# Patient Record
Sex: Female | Born: 1965 | Race: White | Hispanic: No | Marital: Married | Smoking: Never smoker
Health system: Southern US, Community
[De-identification: ages and names within clinical notes are randomized; demographics above are authoritative.]

## PROBLEM LIST (undated history)

## (undated) DIAGNOSIS — A4902 Methicillin resistant Staphylococcus aureus infection, unspecified site: Secondary | ICD-10-CM

## (undated) DIAGNOSIS — K219 Gastro-esophageal reflux disease without esophagitis: Secondary | ICD-10-CM

## (undated) DIAGNOSIS — C541 Malignant neoplasm of endometrium: Secondary | ICD-10-CM

## (undated) DIAGNOSIS — M170 Bilateral primary osteoarthritis of knee: Secondary | ICD-10-CM

## (undated) DIAGNOSIS — H8110 Benign paroxysmal vertigo, unspecified ear: Secondary | ICD-10-CM

## (undated) DIAGNOSIS — N61 Mastitis without abscess: Secondary | ICD-10-CM

## (undated) DIAGNOSIS — R197 Diarrhea, unspecified: Secondary | ICD-10-CM

## (undated) DIAGNOSIS — E559 Vitamin D deficiency, unspecified: Secondary | ICD-10-CM

## (undated) DIAGNOSIS — G4700 Insomnia, unspecified: Secondary | ICD-10-CM

## (undated) DIAGNOSIS — I10 Essential (primary) hypertension: Secondary | ICD-10-CM

## (undated) DIAGNOSIS — S82201A Unspecified fracture of shaft of right tibia, initial encounter for closed fracture: Secondary | ICD-10-CM

## (undated) DIAGNOSIS — K760 Fatty (change of) liver, not elsewhere classified: Secondary | ICD-10-CM

## (undated) DIAGNOSIS — E119 Type 2 diabetes mellitus without complications: Secondary | ICD-10-CM

## (undated) DIAGNOSIS — M549 Dorsalgia, unspecified: Secondary | ICD-10-CM

## (undated) DIAGNOSIS — R140 Abdominal distension (gaseous): Secondary | ICD-10-CM

## (undated) DIAGNOSIS — I714 Abdominal aortic aneurysm, without rupture: Secondary | ICD-10-CM

## (undated) DIAGNOSIS — F411 Generalized anxiety disorder: Secondary | ICD-10-CM

## (undated) DIAGNOSIS — R7303 Prediabetes: Secondary | ICD-10-CM

## (undated) DIAGNOSIS — R1012 Left upper quadrant pain: Secondary | ICD-10-CM

## (undated) DIAGNOSIS — M62838 Other muscle spasm: Secondary | ICD-10-CM

## (undated) DIAGNOSIS — N611 Abscess of the breast and nipple: Secondary | ICD-10-CM

## (undated) DIAGNOSIS — R748 Abnormal levels of other serum enzymes: Secondary | ICD-10-CM

## (undated) DIAGNOSIS — R29898 Other symptoms and signs involving the musculoskeletal system: Secondary | ICD-10-CM

## (undated) DIAGNOSIS — E785 Hyperlipidemia, unspecified: Secondary | ICD-10-CM

## (undated) DIAGNOSIS — M5030 Other cervical disc degeneration, unspecified cervical region: Secondary | ICD-10-CM

## (undated) DIAGNOSIS — C801 Malignant (primary) neoplasm, unspecified: Secondary | ICD-10-CM

## (undated) DIAGNOSIS — R1011 Right upper quadrant pain: Secondary | ICD-10-CM

## (undated) HISTORY — DX: Hyperlipidemia, unspecified: E78.5

## (undated) HISTORY — DX: Other cervical disc degeneration, unspecified cervical region: M50.30

## (undated) HISTORY — DX: Abnormal levels of other serum enzymes: R74.8

## (undated) HISTORY — PX: ABDOMINAL HYSTERECTOMY: SHX81

## (undated) HISTORY — DX: Fatty (change of) liver, not elsewhere classified: K76.0

## (undated) HISTORY — DX: Right upper quadrant pain: R10.11

## (undated) HISTORY — DX: Type 2 diabetes mellitus without complications: E11.9

## (undated) HISTORY — DX: Malignant (primary) neoplasm, unspecified: C80.1

## (undated) HISTORY — DX: Other muscle spasm: M62.838

## (undated) HISTORY — DX: Left upper quadrant pain: R10.12

## (undated) HISTORY — DX: Dorsalgia, unspecified: M54.9

## (undated) HISTORY — PX: ORTHOPEDIC SURGERY: SHX850

## (undated) HISTORY — DX: Unspecified fracture of shaft of right tibia, initial encounter for closed fracture: S82.201A

## (undated) HISTORY — DX: Diarrhea, unspecified: R19.7

## (undated) HISTORY — DX: Mastitis without abscess: N61.0

## (undated) HISTORY — DX: Abdominal aortic aneurysm, without rupture: I71.4

## (undated) HISTORY — DX: Abdominal distension (gaseous): R14.0

## (undated) HISTORY — DX: Abscess of the breast and nipple: N61.1

## (undated) HISTORY — DX: Malignant neoplasm of endometrium: C54.1

## (undated) HISTORY — DX: Generalized anxiety disorder: F41.1

## (undated) HISTORY — DX: Insomnia, unspecified: G47.00

## (undated) HISTORY — DX: Essential (primary) hypertension: I10

## (undated) HISTORY — DX: Vitamin D deficiency, unspecified: E55.9

## (undated) HISTORY — DX: Methicillin resistant Staphylococcus aureus infection, unspecified site: A49.02

## (undated) HISTORY — DX: Other symptoms and signs involving the musculoskeletal system: R29.898

## (undated) HISTORY — DX: Morbid (severe) obesity due to excess calories: E66.01

## (undated) HISTORY — DX: Bilateral primary osteoarthritis of knee: M17.0

## (undated) HISTORY — DX: Gastro-esophageal reflux disease without esophagitis: K21.9

## (undated) HISTORY — DX: Benign paroxysmal vertigo, unspecified ear: H81.10

## (undated) HISTORY — DX: Prediabetes: R73.03

---

## 2014-05-16 ENCOUNTER — Ambulatory Visit (INDEPENDENT_AMBULATORY_CARE_PROVIDER_SITE_OTHER): Payer: BC Managed Care – PPO | Admitting: Physician Assistant

## 2014-05-16 ENCOUNTER — Encounter: Payer: Self-pay | Admitting: Physician Assistant

## 2014-05-16 ENCOUNTER — Other Ambulatory Visit: Payer: Self-pay | Admitting: Physician Assistant

## 2014-05-16 VITALS — BP 133/59 | HR 98 | Ht 60.0 in | Wt 313.0 lb

## 2014-05-16 DIAGNOSIS — S82201S Unspecified fracture of shaft of right tibia, sequela: Secondary | ICD-10-CM

## 2014-05-16 DIAGNOSIS — G4700 Insomnia, unspecified: Secondary | ICD-10-CM

## 2014-05-16 DIAGNOSIS — C541 Malignant neoplasm of endometrium: Secondary | ICD-10-CM

## 2014-05-16 DIAGNOSIS — R5381 Other malaise: Secondary | ICD-10-CM | POA: Diagnosis not present

## 2014-05-16 DIAGNOSIS — F411 Generalized anxiety disorder: Secondary | ICD-10-CM | POA: Diagnosis not present

## 2014-05-16 DIAGNOSIS — K219 Gastro-esophageal reflux disease without esophagitis: Secondary | ICD-10-CM

## 2014-05-16 DIAGNOSIS — R5383 Other fatigue: Secondary | ICD-10-CM

## 2014-05-16 DIAGNOSIS — Z23 Encounter for immunization: Secondary | ICD-10-CM

## 2014-05-16 DIAGNOSIS — Z13220 Encounter for screening for lipoid disorders: Secondary | ICD-10-CM

## 2014-05-16 DIAGNOSIS — Z131 Encounter for screening for diabetes mellitus: Secondary | ICD-10-CM

## 2014-05-16 DIAGNOSIS — I10 Essential (primary) hypertension: Secondary | ICD-10-CM

## 2014-05-16 DIAGNOSIS — Z1239 Encounter for other screening for malignant neoplasm of breast: Secondary | ICD-10-CM

## 2014-05-16 HISTORY — DX: Malignant neoplasm of endometrium: C54.1

## 2014-05-16 MED ORDER — PANTOPRAZOLE SODIUM 40 MG PO TBEC: 40.0000 mg | DELAYED_RELEASE_TABLET | Freq: Every day | ORAL | Status: DC

## 2014-05-16 MED ORDER — OLMESARTAN MEDOXOMIL-HCTZ 20-12.5 MG PO TABS: 1.0000 | ORAL_TABLET | Freq: Every day | ORAL | Status: DC

## 2014-05-16 MED ORDER — ALPRAZOLAM 0.5 MG PO TABS: 0.5000 mg | ORAL_TABLET | Freq: Every day | ORAL | Status: DC

## 2014-05-17 DIAGNOSIS — S82201A Unspecified fracture of shaft of right tibia, initial encounter for closed fracture: Secondary | ICD-10-CM | POA: Insufficient documentation

## 2014-05-17 DIAGNOSIS — F411 Generalized anxiety disorder: Secondary | ICD-10-CM

## 2014-05-17 DIAGNOSIS — G4700 Insomnia, unspecified: Secondary | ICD-10-CM | POA: Insufficient documentation

## 2014-05-17 DIAGNOSIS — F419 Anxiety disorder, unspecified: Secondary | ICD-10-CM | POA: Insufficient documentation

## 2014-05-17 DIAGNOSIS — K219 Gastro-esophageal reflux disease without esophagitis: Secondary | ICD-10-CM | POA: Insufficient documentation

## 2014-05-17 DIAGNOSIS — I10 Essential (primary) hypertension: Secondary | ICD-10-CM | POA: Insufficient documentation

## 2014-05-17 HISTORY — DX: Unspecified fracture of shaft of right tibia, initial encounter for closed fracture: S82.201A

## 2014-05-17 HISTORY — DX: Essential (primary) hypertension: I10

## 2014-05-17 HISTORY — DX: Insomnia, unspecified: G47.00

## 2014-05-17 HISTORY — DX: Generalized anxiety disorder: F41.1

## 2014-05-17 HISTORY — DX: Gastro-esophageal reflux disease without esophagitis: K21.9

## 2014-05-17 NOTE — Progress Notes (Signed)
   Subjective:    Patient ID: Tamara Greer, female    DOB: 1966-04-07, 48 y.o.   MRN: [id number]  HPI Pt is a 48 yo female who presents to the clinic to establish care.   .. Active Ambulatory Problems    Diagnosis Date Noted  . Endometrial cancer 05/16/2014  . HTN (hypertension), benign 05/17/2014  . Insomnia 05/17/2014  . Anxiety state, unspecified 05/17/2014   Resolved Ambulatory Problems    Diagnosis Date Noted  . No Resolved Ambulatory Problems   Past Medical History  Diagnosis Date  . Cancer    .[name] Kitchen Family History  Problem Relation Age of Onset  . Diabetes Mother    .[name] Kitchen History   Social History  . Marital Status: Married    Spouse Name: N/A    Number of Children: N/A  . Years of Education: N/A   Occupational History  . Not on file.   Social History Main Topics  . Smoking status: Never Smoker   . Smokeless tobacco: Not on file  . Alcohol Use: No  . Drug Use: No  . Sexual Activity: Yes   Other Topics Concern  . Not on file   Social History Narrative  . No narrative on file   Pt does need refills.   HTN- controlled. No problems or complications. Denies CP, palpations, vision changes, or headaches.   GERD- controlled with protonix.   Insomnia/anxiety- controlled with xanax at bedtime.    Review of Systems  All other systems reviewed and are negative.      Objective:   Physical Exam  Constitutional: She is oriented to person, place, and time. She appears well-developed and well-nourished.  obese  HENT:  Head: Normocephalic and atraumatic.  Cardiovascular: Normal rate, regular rhythm and normal heart sounds.   Pulmonary/Chest: Effort normal and breath sounds normal. She has no wheezes.  Neurological: She is alert and oriented to person, place, and time.  Skin: Skin is dry.  Psychiatric: She has a normal mood and affect. Her behavior is normal.          Assessment & Plan:  HTN- refilled benicar for 6 months.   GERD- refilled protonix  for 6 months.   Insomnia/anxiety- xanax refilled for 6 months.   Fatigue/screening labs- will get full work up done via blood work. Schedule CPE and will discuss blood work at that time.   Tdap given without complications today.   Pt declined flu shot.   Mammogram ordered today.   Discussed with pt need for CPE. Sent order for fasting labs.

## 2014-05-18 LAB — CBC
HEMATOCRIT: 38.4 % (ref 36.0–46.0)
HEMOGLOBIN: 13.3 g/dL (ref 12.0–15.0)
MCH: 31.6 pg (ref 26.0–34.0)
MCHC: 34.6 g/dL (ref 30.0–36.0)
MCV: 91.2 fL (ref 78.0–100.0)
Platelets: 226 10*3/uL (ref 150–400)
RBC: 4.21 MIL/uL (ref 3.87–5.11)
RDW: 13.6 % (ref 11.5–15.5)
WBC: 5.4 10*3/uL (ref 4.0–10.5)

## 2014-05-19 ENCOUNTER — Encounter: Payer: Self-pay | Admitting: Physician Assistant

## 2014-05-19 ENCOUNTER — Other Ambulatory Visit: Payer: Self-pay | Admitting: Physician Assistant

## 2014-05-19 DIAGNOSIS — R748 Abnormal levels of other serum enzymes: Secondary | ICD-10-CM

## 2014-05-19 DIAGNOSIS — E559 Vitamin D deficiency, unspecified: Secondary | ICD-10-CM | POA: Insufficient documentation

## 2014-05-19 HISTORY — DX: Vitamin D deficiency, unspecified: E55.9

## 2014-05-19 HISTORY — DX: Abnormal levels of other serum enzymes: R74.8

## 2014-05-19 LAB — COMPLETE METABOLIC PANEL WITH GFR
ALT: 115 U/L — ABNORMAL HIGH (ref 0–35)
AST: 117 U/L — AB (ref 0–37)
Albumin: 4.4 g/dL (ref 3.5–5.2)
Alkaline Phosphatase: 84 U/L (ref 39–117)
BUN: 17 mg/dL (ref 6–23)
CHLORIDE: 100 meq/L (ref 96–112)
CO2: 23 meq/L (ref 19–32)
CREATININE: 0.56 mg/dL (ref 0.50–1.10)
Calcium: 9.5 mg/dL (ref 8.4–10.5)
GFR, Est Non African American: >89 mL/min
Glucose, Bld: 122 mg/dL — ABNORMAL HIGH (ref 70–99)
Potassium: 4.5 mEq/L (ref 3.5–5.3)
Sodium: 136 mEq/L (ref 135–145)
Total Bilirubin: 0.7 mg/dL (ref 0.2–1.2)
Total Protein: 7.1 g/dL (ref 6.0–8.3)

## 2014-05-19 LAB — LIPID PANEL
CHOLESTEROL: 239 mg/dL — AB (ref 0–200)
HDL: 56 mg/dL (ref >39)
LDL Cholesterol: 152 mg/dL — ABNORMAL HIGH (ref 0–99)
TRIGLYCERIDES: 153 mg/dL — AB (ref <150)
Total CHOL/HDL Ratio: 4.3 Ratio
VLDL: 31 mg/dL (ref 0–40)

## 2014-05-19 LAB — HEMOGLOBIN A1C
HEMOGLOBIN A1C: 6.4 % — AB (ref <5.7)
MEAN PLASMA GLUCOSE: 137 mg/dL — AB (ref <117)

## 2014-05-19 LAB — VITAMIN B12: Vitamin B-12: 418 pg/mL (ref 211–911)

## 2014-05-19 LAB — TSH: TSH: 3.513 u[IU]/mL (ref 0.350–4.500)

## 2014-05-19 LAB — FERRITIN: Ferritin: 214 ng/mL (ref 10–291)

## 2014-05-19 LAB — VITAMIN D 25 HYDROXY (VIT D DEFICIENCY, FRACTURES): Vit D, 25-Hydroxy: 14 ng/mL — ABNORMAL LOW (ref 30–89)

## 2014-05-19 MED ORDER — VITAMIN D (ERGOCALCIFEROL) 1.25 MG (50000 UNIT) PO CAPS: 50000.0000 [IU] | ORAL_CAPSULE | ORAL | Status: DC

## 2014-05-20 ENCOUNTER — Other Ambulatory Visit: Payer: Self-pay | Admitting: *Deleted

## 2014-05-20 MED ORDER — OLMESARTAN MEDOXOMIL-HCTZ 40-12.5 MG PO TABS: 1.0000 | ORAL_TABLET | Freq: Every day | ORAL | Status: DC

## 2014-05-31 ENCOUNTER — Encounter: Payer: Self-pay | Admitting: Physician Assistant

## 2014-05-31 DIAGNOSIS — M170 Bilateral primary osteoarthritis of knee: Secondary | ICD-10-CM

## 2014-05-31 HISTORY — DX: Bilateral primary osteoarthritis of knee: M17.0

## 2014-06-08 ENCOUNTER — Ambulatory Visit (INDEPENDENT_AMBULATORY_CARE_PROVIDER_SITE_OTHER): Payer: BC Managed Care – PPO | Admitting: Physician Assistant

## 2014-06-08 ENCOUNTER — Encounter: Payer: Self-pay | Admitting: Physician Assistant

## 2014-06-08 ENCOUNTER — Ambulatory Visit (INDEPENDENT_AMBULATORY_CARE_PROVIDER_SITE_OTHER): Payer: BC Managed Care – PPO

## 2014-06-08 VITALS — BP 110/61 | HR 91 | Ht 60.0 in | Wt 311.0 lb

## 2014-06-08 DIAGNOSIS — Z1239 Encounter for other screening for malignant neoplasm of breast: Secondary | ICD-10-CM

## 2014-06-08 DIAGNOSIS — E785 Hyperlipidemia, unspecified: Secondary | ICD-10-CM

## 2014-06-08 DIAGNOSIS — Z0000 Encounter for general adult medical examination without abnormal findings: Secondary | ICD-10-CM | POA: Diagnosis not present

## 2014-06-08 DIAGNOSIS — Z1231 Encounter for screening mammogram for malignant neoplasm of breast: Secondary | ICD-10-CM

## 2014-06-08 DIAGNOSIS — E559 Vitamin D deficiency, unspecified: Secondary | ICD-10-CM

## 2014-06-08 DIAGNOSIS — R748 Abnormal levels of other serum enzymes: Secondary | ICD-10-CM | POA: Diagnosis not present

## 2014-06-08 DIAGNOSIS — R7303 Prediabetes: Secondary | ICD-10-CM

## 2014-06-08 DIAGNOSIS — E669 Obesity, unspecified: Secondary | ICD-10-CM

## 2014-06-08 DIAGNOSIS — R7309 Other abnormal glucose: Secondary | ICD-10-CM

## 2014-06-08 DIAGNOSIS — R635 Abnormal weight gain: Secondary | ICD-10-CM

## 2014-06-08 MED ORDER — PHENTERMINE HCL 37.5 MG PO TABS: 37.5000 mg | ORAL_TABLET | Freq: Every day | ORAL | Status: DC

## 2014-06-08 MED ORDER — ATORVASTATIN CALCIUM 40 MG PO TABS: 40.0000 mg | ORAL_TABLET | Freq: Every day | ORAL | Status: DC

## 2014-06-08 MED ORDER — ESOMEPRAZOLE MAGNESIUM 40 MG PO PACK: 40.0000 mg | PACK | Freq: Every day | ORAL | Status: DC

## 2014-06-08 MED ORDER — OLMESARTAN MEDOXOMIL-HCTZ 20-12.5 MG PO TABS: 1.0000 | ORAL_TABLET | Freq: Every day | ORAL | Status: DC

## 2014-06-08 NOTE — Patient Instructions (Signed)

## 2014-06-13 NOTE — Progress Notes (Signed)
  Subjective:     Tamara Greer is a 48 y.o. female and is here for a comprehensive physical exam. The patient reports problems - concerned about weight and labs that were drawn at last visit. [name] Kitchen  History   Social History  . Marital Status: Married    Spouse Name: N/A    Number of Children: N/A  . Years of Education: N/A   Occupational History  . Not on file.   Social History Main Topics  . Smoking status: Never Smoker   . Smokeless tobacco: Not on file  . Alcohol Use: No  . Drug Use: No  . Sexual Activity: Yes   Other Topics Concern  . Not on file   Social History Narrative  . No narrative on file   Health Maintenance  Topic Date Due  . Pap Smear  02/17/1982  . Influenza Vaccine  05/17/2015 (Originally 04/02/2014)  . Mammogram  06/08/2016  . Colonoscopy  09/03/2019  . Tetanus/tdap  05/16/2024    The following portions of the patient's history were reviewed and updated as appropriate: allergies, current medications, past family history, past medical history, past social history, past surgical history and problem list.  Review of Systems A comprehensive review of systems was negative.   Objective:    BP 110/61  Pulse 91  Ht 5' (1.524 m)  Wt 311 lb (141.069 kg)  BMI 60.74 kg/m2 General appearance: alert, cooperative, appears stated age and moderately obese Head: Normocephalic, without obvious abnormality, atraumatic Eyes: conjunctivae/corneas clear. PERRL, EOM's intact. Fundi benign. Ears: normal TM's and external ear canals both ears Nose: Nares normal. Septum midline. Mucosa normal. No drainage or sinus tenderness. Throat: lips, mucosa, and tongue normal; teeth and gums normal Neck: no adenopathy, no carotid bruit, no JVD, supple, symmetrical, trachea midline and thyroid not enlarged, symmetric, no tenderness/mass/nodules Back: symmetric, no curvature. ROM normal. No CVA tenderness. Lungs: clear to auscultation bilaterally Heart: regular rate and rhythm, S1, S2  normal, no murmur, click, rub or gallop Abdomen: soft, non-tender; bowel sounds normal; no masses,  no organomegaly Extremities: extremities normal, atraumatic, no cyanosis or edema Pulses: 2+ and symmetric Skin: Skin color, texture, turgor normal. No rashes or lesions Lymph nodes: Cervical, supraclavicular, and axillary nodes normal. Neurologic: Grossly normal    Assessment:    Healthy female exam.       Plan:    CPE- flu shot declined.discussed need for pap. colonoscopy up to date. Mammogram up to date.  Discussed weight loss and regular exercise. Recommended calcium 1200mg . Pt is on high dose vitamin D for vitamin insufficiency.   Obesity/abnormal weight loss- discuss plan. Calorie 1500 daily. Phentermine. Discussed side effects. Drink more water. Follow up in 4 weeks.   Vitamin D deficiency  will recheck after being on vitamin D doses.   Elevated liver enzymes- will recheck after elevation on last labs.   Hyperlipidemia- under 160 but with pre-diabetes dx concerning and would like for patient to be on. Unfortunately they can elevated liver enzymes. Given rx but would like patient to hold off until recheck liver enzymes and decided next best step.  See After Visit Summary for Counseling Recommendations

## 2014-07-26 ENCOUNTER — Ambulatory Visit (INDEPENDENT_AMBULATORY_CARE_PROVIDER_SITE_OTHER): Payer: BC Managed Care – PPO | Admitting: Physician Assistant

## 2014-07-26 ENCOUNTER — Encounter: Payer: Self-pay | Admitting: Physician Assistant

## 2014-07-26 VITALS — BP 127/64 | HR 85 | Temp 98.3°F | Ht 60.0 in

## 2014-07-26 DIAGNOSIS — R062 Wheezing: Secondary | ICD-10-CM | POA: Diagnosis not present

## 2014-07-26 DIAGNOSIS — J4521 Mild intermittent asthma with (acute) exacerbation: Secondary | ICD-10-CM

## 2014-07-26 DIAGNOSIS — R05 Cough: Secondary | ICD-10-CM

## 2014-07-26 DIAGNOSIS — R059 Cough, unspecified: Secondary | ICD-10-CM

## 2014-07-26 MED ORDER — AZITHROMYCIN 250 MG PO TABS: ORAL_TABLET | ORAL | Status: DC

## 2014-07-26 MED ORDER — IPRATROPIUM-ALBUTEROL 0.5-2.5 (3) MG/3ML IN SOLN
3.0000 mL | Freq: Once | RESPIRATORY_TRACT | Status: AC
Start: 2014-07-26 — End: 2014-07-26
  Administered 2014-07-26: 3 mL via RESPIRATORY_TRACT

## 2014-07-26 MED ORDER — PREDNISONE 50 MG PO TABS: ORAL_TABLET | ORAL | Status: DC

## 2014-07-26 MED ORDER — ALBUTEROL SULFATE 108 (90 BASE) MCG/ACT IN AEPB: 2.0000 | INHALATION_SPRAY | RESPIRATORY_TRACT | Status: DC | PRN

## 2014-07-26 NOTE — Progress Notes (Signed)
   Subjective:    Patient ID: Tamara Greer, female    DOB: May 07, 1966, 48 y.o.   MRN: [id number]  HPI Pt is a 48 yo female who presents to the clinic with cough, wheezing, SOB for last 5 days. Cough is productive with green sputum.  Hx of childhood asthma. Denies any fever, chills, n/v/d. Has had URI earlier last week but now feels like going into chest. She has been a lot more fatigued lately. Not done anything to make better.    Review of Systems  All other systems reviewed and are negative.      Objective:   Physical Exam  Constitutional: She is oriented to person, place, and time. She appears well-developed and well-nourished.  HENT:  Head: Normocephalic and atraumatic.  Right Ear: External ear normal.  Left Ear: External ear normal.  Nose: Nose normal.  Mouth/Throat: Oropharynx is clear and moist. No oropharyngeal exudate.  Eyes: Conjunctivae are normal. Right eye exhibits no discharge. Left eye exhibits no discharge.  Neck: Normal range of motion. Neck supple.  Cardiovascular: Normal rate, regular rhythm and normal heart sounds.   Pulmonary/Chest:  Effort good.  Expiratory wheezing heard on exam today bilateral lungs.  No rhonchi or crackles.   Lymphadenopathy:    She has no cervical adenopathy.  Neurological: She is alert and oriented to person, place, and time.  Skin: Skin is dry.  Psychiatric: She has a normal mood and affect. Her behavior is normal.          Assessment & Plan:  Asthmatic bronchitis- peak flows yellow. duoneb given with improvement. Sent albuterol inhaler to pharmacy to use every 4-6 hours as needed. Prednisone for 5 days. zpak for 5 days. Follow up if not improving.

## 2014-07-26 NOTE — Patient Instructions (Signed)

## 2014-09-30 ENCOUNTER — Encounter: Payer: Self-pay | Admitting: Physician Assistant

## 2014-09-30 ENCOUNTER — Ambulatory Visit (INDEPENDENT_AMBULATORY_CARE_PROVIDER_SITE_OTHER): Payer: BLUE CROSS/BLUE SHIELD | Admitting: Physician Assistant

## 2014-09-30 VITALS — Ht 60.0 in | Wt 307.0 lb

## 2014-09-30 DIAGNOSIS — I10 Essential (primary) hypertension: Secondary | ICD-10-CM

## 2014-09-30 DIAGNOSIS — R42 Dizziness and giddiness: Secondary | ICD-10-CM

## 2014-09-30 DIAGNOSIS — H8110 Benign paroxysmal vertigo, unspecified ear: Secondary | ICD-10-CM | POA: Insufficient documentation

## 2014-09-30 DIAGNOSIS — H8113 Benign paroxysmal vertigo, bilateral: Secondary | ICD-10-CM | POA: Diagnosis not present

## 2014-09-30 HISTORY — DX: Benign paroxysmal vertigo, unspecified ear: H81.10

## 2014-09-30 MED ORDER — PREDNISONE 50 MG PO TABS: ORAL_TABLET | ORAL | Status: DC

## 2014-09-30 MED ORDER — OLMESARTAN MEDOXOMIL-HCTZ 40-12.5 MG PO TABS: 1.0000 | ORAL_TABLET | Freq: Every day | ORAL | Status: DC

## 2014-09-30 NOTE — Progress Notes (Signed)
   Subjective:    Patient ID: Tamara Greer, female    DOB: Jul 18, 1966, 49 y.o.   MRN: [id number]  HPI  Pt presents to the clinic with dizziness for last month off and on. She has periods where the whole room spins. No vision changes. Not checking BP but face feels flushed a lot. Turning head fast sometimes makes worse. No new medications. No ear pain. She did have a recent URI/sinusitis where she took abx. Sinusitis symptoms are improved. No CP, palpitations, arm numbness. No nausea, fever, chills. Has not passed out. Not tried anything to make better. We did decrease Benicar at last visit.   For the last month. Sit still and room spin. Dizzy. A little SOB. No vision changes. Anything make worse- sometimes turning head. Laying down and room spinning. No new medications. nexium OTC. No ear pain. Recent URI.    Review of Systems  All other systems reviewed and are negative.      Objective:   Physical Exam  Constitutional: She is oriented to person, place, and time. She appears well-developed and well-nourished.  HENT:  Head: Normocephalic and atraumatic.  Right Ear: External ear normal.  Left Ear: External ear normal.  Nose: Nose normal.  Mouth/Throat: Oropharynx is clear and moist. No oropharyngeal exudate.  Eyes: Conjunctivae are normal. Right eye exhibits no discharge. Left eye exhibits no discharge.  Cardiovascular: Normal rate, regular rhythm and normal heart sounds.   Pulmonary/Chest: Effort normal and breath sounds normal. She has no wheezes.  Neurological: She is alert and oriented to person, place, and time.  dix hallpike bilaterally.  Skin: Skin is dry.  Psychiatric: She has a normal mood and affect. Her behavior is normal.          Assessment & Plan:  BPV/dizziness/HTN- dix hallpike positive for BPV bilaterally. Orthostatic normal. BP elevated today. TM's were bulding with fluid. Given prednisone for 5 days. Start flonase 2 sprays bid. epley manuevers given to start 3x  a day. BP high today and could be causing some of symptoms. Increased back to 40mg /12.5mg . Double up on what you have first. Check BP regularly at pharmacy. Follow up nurse for  visit in 1 month for BP recheck. Will get cmp to look for any metabolic abnormalities. Follow up with new symptoms or worsening.

## 2014-09-30 NOTE — Patient Instructions (Signed)
Prednisone start.  flonase 2 sprays each nostril.  Start epley manuevers.  Double up on Benicar lower dose.  Follow up 4 weeks.

## 2014-10-04 LAB — COMPLETE METABOLIC PANEL WITH GFR
ALBUMIN: 4 g/dL (ref 3.5–5.2)
ALK PHOS: 88 U/L (ref 39–117)
ALT: 140 U/L — ABNORMAL HIGH (ref 0–35)
AST: 96 U/L — AB (ref 0–37)
BUN: 18 mg/dL (ref 6–23)
CO2: 25 mEq/L (ref 19–32)
Calcium: 8.9 mg/dL (ref 8.4–10.5)
Chloride: 100 mEq/L (ref 96–112)
Creat: 0.52 mg/dL (ref 0.50–1.10)
GFR, Est Non African American: >89 mL/min
GLUCOSE: 106 mg/dL — AB (ref 70–99)
POTASSIUM: 4.3 meq/L (ref 3.5–5.3)
SODIUM: 138 meq/L (ref 135–145)
Total Bilirubin: 0.5 mg/dL (ref 0.2–1.2)
Total Protein: 6.6 g/dL (ref 6.0–8.3)

## 2014-10-06 ENCOUNTER — Telehealth: Payer: Self-pay | Admitting: *Deleted

## 2014-10-06 DIAGNOSIS — R748 Abnormal levels of other serum enzymes: Secondary | ICD-10-CM

## 2014-10-10 ENCOUNTER — Other Ambulatory Visit: Payer: BLUE CROSS/BLUE SHIELD

## 2014-10-13 ENCOUNTER — Ambulatory Visit (INDEPENDENT_AMBULATORY_CARE_PROVIDER_SITE_OTHER): Payer: BLUE CROSS/BLUE SHIELD

## 2014-10-13 DIAGNOSIS — R748 Abnormal levels of other serum enzymes: Secondary | ICD-10-CM

## 2014-10-13 DIAGNOSIS — K760 Fatty (change of) liver, not elsewhere classified: Secondary | ICD-10-CM

## 2014-10-14 ENCOUNTER — Other Ambulatory Visit: Payer: Self-pay | Admitting: Physician Assistant

## 2014-10-14 DIAGNOSIS — K760 Fatty (change of) liver, not elsewhere classified: Secondary | ICD-10-CM

## 2014-10-14 DIAGNOSIS — I714 Abdominal aortic aneurysm, without rupture, unspecified: Secondary | ICD-10-CM

## 2014-10-14 HISTORY — DX: Fatty (change of) liver, not elsewhere classified: K76.0

## 2014-10-20 ENCOUNTER — Ambulatory Visit (INDEPENDENT_AMBULATORY_CARE_PROVIDER_SITE_OTHER): Payer: BLUE CROSS/BLUE SHIELD

## 2014-10-20 DIAGNOSIS — I714 Abdominal aortic aneurysm, without rupture, unspecified: Secondary | ICD-10-CM

## 2014-10-21 ENCOUNTER — Encounter: Payer: Self-pay | Admitting: Physician Assistant

## 2014-10-21 DIAGNOSIS — I714 Abdominal aortic aneurysm, without rupture, unspecified: Secondary | ICD-10-CM

## 2014-10-21 HISTORY — DX: Abdominal aortic aneurysm, without rupture: I71.4

## 2014-10-21 HISTORY — DX: Abdominal aortic aneurysm, without rupture, unspecified: I71.40

## 2014-10-25 ENCOUNTER — Ambulatory Visit (INDEPENDENT_AMBULATORY_CARE_PROVIDER_SITE_OTHER): Payer: BLUE CROSS/BLUE SHIELD | Admitting: Physician Assistant

## 2014-10-25 ENCOUNTER — Encounter: Payer: Self-pay | Admitting: Physician Assistant

## 2014-10-25 VITALS — BP 119/58 | HR 87 | Ht 60.0 in | Wt 307.0 lb

## 2014-10-25 DIAGNOSIS — K219 Gastro-esophageal reflux disease without esophagitis: Secondary | ICD-10-CM | POA: Diagnosis not present

## 2014-10-25 DIAGNOSIS — I714 Abdominal aortic aneurysm, without rupture, unspecified: Secondary | ICD-10-CM

## 2014-10-25 DIAGNOSIS — R1012 Left upper quadrant pain: Secondary | ICD-10-CM

## 2014-10-25 MED ORDER — ALPRAZOLAM 0.5 MG PO TABS: 0.5000 mg | ORAL_TABLET | Freq: Two times a day (BID) | ORAL | Status: DC | PRN

## 2014-10-25 MED ORDER — METOPROLOL SUCCINATE ER 25 MG PO TB24: 25.0000 mg | ORAL_TABLET | Freq: Every day | ORAL | Status: DC

## 2014-10-25 MED ORDER — ATORVASTATIN CALCIUM 40 MG PO TABS: 40.0000 mg | ORAL_TABLET | Freq: Every day | ORAL | Status: DC

## 2014-10-25 NOTE — Patient Instructions (Addendum)
Abdominal Aortic Aneurysm An aneurysm is a weakened or damaged part of an artery wall that bulges from the normal force of blood pumping through the body. An abdominal aortic aneurysm is an aneurysm that occurs in the lower part of the aorta, the main artery of the body.  The major concern with an abdominal aortic aneurysm is that it can enlarge and burst (rupture) or blood can flow between the layers of the wall of the aorta through a tear (aorticdissection). Both of these conditions can cause bleeding inside the body and can be life threatening unless diagnosed and treated promptly. CAUSES  The exact cause of an abdominal aortic aneurysm is unknown. Some contributing factors are:   A hardening of the arteries caused by the buildup of fat and other substances in the lining of a blood vessel (arteriosclerosis).  Inflammation of the walls of an artery (arteritis).   Connective tissue diseases, such as Marfan syndrome.   Abdominal trauma.   An infection, such as syphilis or staphylococcus, in the wall of the aorta (infectious aortitis) caused by bacteria. RISK FACTORS  Risk factors that contribute to an abdominal aortic aneurysm may include:  Age older than 60 years.   High blood pressure (hypertension).  Female gender.  Ethnicity (white race).  Obesity.  Family history of aneurysm (first degree relatives only).  Tobacco use. PREVENTION  The following healthy lifestyle habits may help decrease your risk of abdominal aortic aneurysm:  Quitting smoking. Smoking can raise your blood pressure and cause arteriosclerosis.  Limiting or avoiding alcohol.  Keeping your blood pressure, blood sugar level, and cholesterol levels within normal limits.  Decreasing your salt intake. In somepeople, too much salt can raise blood pressure and increase your risk of abdominal aortic aneurysm.  Eating a diet low in saturated fats and cholesterol.  Increasing your fiber intake by including  whole grains, vegetables, and fruits in your diet. Eating these foods may help lower blood pressure.  Maintaining a healthy weight.  Staying physically active and exercising regularly. SYMPTOMS  The symptoms of abdominal aortic aneurysm may vary depending on the size and rate of growth of the aneurysm.Most grow slowly and do not have any symptoms. When symptoms do occur, they may include:  Pain (abdomen, side, lower back, or groin). The pain may vary in intensity. A sudden onset of severe pain may indicate that the aneurysm has ruptured.  Feeling full after eating only small amounts of food.  Nausea or vomiting or both.  Feeling a pulsating lump in the abdomen.  Feeling faint or passing out. DIAGNOSIS  Since most unruptured abdominal aortic aneurysms have no symptoms, they are often discovered during diagnostic exams for other conditions. An aneurysm may be found during the following procedures:  Ultrasonography (A one-time screening for abdominal aortic aneurysm by ultrasonography is also recommended for all men aged 65-75 years who have ever smoked).  X-ray exams.  A computed tomography (CT).  Magnetic resonance imaging (MRI).  Angiography or arteriography. TREATMENT  Treatment of an abdominal aortic aneurysm depends on the size of your aneurysm, your age, and risk factors for rupture. Medication to control blood pressure and pain may be used to manage aneurysms smaller than 6 cm. Regular monitoring for enlargement may be recommended by your caregiver if:  The aneurysm is 3-4 cm in size (an annual ultrasonography may be recommended).  The aneurysm is 4-4.5 cm in size (an ultrasonography every 6 months may be recommended).  The aneurysm is larger than 4.5 cm in   size (your caregiver may ask that you be examined by a vascular surgeon). If your aneurysm is larger than 6 cm, surgical repair may be recommended. There are two main methods for repair of an aneurysm:   Endovascular  repair (a minimally invasive surgery). This is done most often.  Open repair. This method is used if an endovascular repair is not possible. Document Released: 05/29/2005 Document Revised: 12/14/2012 Document Reviewed: 09/18/2012 [facility] Patient Information 2015 [place], [place]. This information is not intended to replace advice given to you by your health care provider. Make sure you discuss any questions you have with your health care provider.  Stop nexium for 2 weeks. Get stool culture for h.pylori.  Start lipitor daily. Recheck cholesterol 3-6 months.  Start baby ASA 81mg  daily. Hold off for now due to black tarry stools.  STart metoprolol 25mg  daily.

## 2014-10-25 NOTE — Progress Notes (Signed)
   Subjective:    Patient ID: Tamara Greer, female    DOB: October 22, 1965, 49 y.o.   MRN: [id number]  HPI Pt comes in to discuss aortic aneurysm found on ultrasound.    Review of Systems  All other systems reviewed and are negative.      Objective:   Physical Exam  Constitutional: She appears well-developed and well-nourished.  Obese, morbidly.   HENT:  Head: Normocephalic and atraumatic.  Cardiovascular: Normal rate, regular rhythm and normal heart sounds.   Abdominal: She exhibits no distension and no mass. There is no rebound and no guarding.  I did not hear any abdominal bruits. No pulsation.   Some tenderness to palpation above umbilicus and up into LUQ.           Assessment & Plan:  Abdominal aortic anersym- pt very concerned discussed this size(3.7cm) we continue to watch. She is to start lipitor and metoprolol to decrease risk factors. LDL was elevated but asked to hold until we figured out why liver enzymes were elevated. Recheck liver enzymes 3-6 months after starting lipitor.  BP and pulse good on exam today I still wanted pt to have BB effect. Watch for dizziness or decrease in BP if so may need to cut back on benicar.  Discussed will monitor yearly anersym with U/S. Pt is still very anxious. Will refer to vascular surgery to discuss with patient but likely watchful monitoring is all that is indicated. HO given. Signs and symptoms discussed.    GERD/black tarry stools- mentions this on the way out. Did not confirm today with hemoccult with LUQ pain that is constant for year or more and no changes. Will get stool testing for h.pylori and CBC. Then restart PPI. Reports nexium and omeprazole not working. Will consider dexilant. Currently taking her off everything to test for h.pylori in 2 weeks. No NSAIDs. Keep GERD diet.    Spent 30 minutes with patient and greater than 50 percent of visit spent counseling patient regarding protocol for AAA.

## 2014-10-28 DIAGNOSIS — R1012 Left upper quadrant pain: Secondary | ICD-10-CM

## 2014-10-28 HISTORY — DX: Left upper quadrant pain: R10.12

## 2014-11-08 LAB — HELICOBACTER PYLORI  SPECIAL ANTIGEN: H. PYLORI Antigen: NEGATIVE

## 2014-11-14 ENCOUNTER — Encounter: Payer: BC Managed Care – PPO | Admitting: Physician Assistant

## 2014-12-14 ENCOUNTER — Other Ambulatory Visit: Payer: Self-pay | Admitting: Physician Assistant

## 2014-12-16 ENCOUNTER — Other Ambulatory Visit: Payer: Self-pay | Admitting: Physician Assistant

## 2014-12-19 ENCOUNTER — Other Ambulatory Visit: Payer: Self-pay | Admitting: Physician Assistant

## 2014-12-28 ENCOUNTER — Ambulatory Visit (INDEPENDENT_AMBULATORY_CARE_PROVIDER_SITE_OTHER): Payer: BLUE CROSS/BLUE SHIELD | Admitting: Physician Assistant

## 2014-12-28 ENCOUNTER — Encounter: Payer: Self-pay | Admitting: Physician Assistant

## 2014-12-28 VITALS — BP 121/64 | HR 73 | Ht 60.0 in | Wt 306.0 lb

## 2014-12-28 DIAGNOSIS — I714 Abdominal aortic aneurysm, without rupture, unspecified: Secondary | ICD-10-CM

## 2014-12-28 DIAGNOSIS — R1012 Left upper quadrant pain: Secondary | ICD-10-CM | POA: Diagnosis not present

## 2014-12-28 DIAGNOSIS — E119 Type 2 diabetes mellitus without complications: Secondary | ICD-10-CM

## 2014-12-28 DIAGNOSIS — R7301 Impaired fasting glucose: Secondary | ICD-10-CM

## 2014-12-28 DIAGNOSIS — K2100 Gastro-esophageal reflux disease with esophagitis, without bleeding: Secondary | ICD-10-CM

## 2014-12-28 HISTORY — DX: Type 2 diabetes mellitus without complications: E11.9

## 2014-12-28 HISTORY — DX: Left upper quadrant pain: R10.12

## 2014-12-28 LAB — POCT GLYCOSYLATED HEMOGLOBIN (HGB A1C): HEMOGLOBIN A1C: 6.7

## 2014-12-28 MED ORDER — DEXLANSOPRAZOLE 60 MG PO CPDR: 60.0000 mg | DELAYED_RELEASE_CAPSULE | Freq: Every day | ORAL | Status: DC

## 2014-12-28 MED ORDER — METOPROLOL SUCCINATE ER 25 MG PO TB24: ORAL_TABLET | ORAL | Status: DC

## 2014-12-28 MED ORDER — METFORMIN HCL 500 MG PO TABS: 500.0000 mg | ORAL_TABLET | Freq: Two times a day (BID) | ORAL | Status: DC

## 2014-12-28 MED ORDER — OLMESARTAN MEDOXOMIL-HCTZ 40-12.5 MG PO TABS: 1.0000 | ORAL_TABLET | Freq: Every day | ORAL | Status: DC

## 2014-12-28 NOTE — Progress Notes (Signed)
   Subjective:    Patient ID: Tamara Greer, female    DOB: October 18, 1965, 49 y.o.   MRN: [id number]  HPI  Pt presents to the clinic with concerns of her AAA growing. She does not feel like nexium working. She is still having LUQ pain and acid reflux. Pain of epigastric area and LUQ feels a lot like pressure. Sometimes will go into her neck. She has been under a lot of stress. Her mother recently went to a nursing home. It is worse after she eats. No vomiting or diarrhea or constipation. No matter what she eats there is indigestion.     Review of Systems  All other systems reviewed and are negative.      Objective:   Physical Exam  Constitutional: She is oriented to person, place, and time. She appears well-developed and well-nourished.  Morbidly obese.   HENT:  Head: Normocephalic and atraumatic.  Cardiovascular: Normal rate, regular rhythm and normal heart sounds.   Pulmonary/Chest: Effort normal and breath sounds normal.  Abdominal: Soft. Bowel sounds are normal. She exhibits no distension and no mass. There is tenderness. There is no rebound and no guarding.  Tenderness over epigastric/LUQ.   Neurological: She is alert and oriented to person, place, and time.  Skin: Skin is dry.  Psychiatric: She has a normal mood and affect. Her behavior is normal.          Assessment & Plan:  LUQ pain/GERD/AAA- pt has surgeon and just saw him for AAA. He does not want to repeat scan for at least a year. BP good, pt on statin. Pt is very anxious about this being the problem. We are going to treat most likely cause GERD and then go from there. Switched PPI to dexilant. Discussed side effects and risk of prolonged use. H.pylori negative in 11/2014. Abdominal U/s normal gallbladder in 11/2014. Follow up if not improving.   Hyperlipidemia- not due for recheck. On lipitor.   DM type II- ... Lab Results  Component Value Date   HGBA1C 6.7 50/93/2671   Certainly went into diabetic range since last  recheck.  Discussed with patient how this complications all diagnoses and we have much stricter levels of abnormal vs normal.  First line would be to start metformin but I do not want to complicate GI issues that are going on. Hold off on metformin. If other GI issues clear and want to start then ok. Discussed potential side effects.  Discussed diet changes that have to be made via low carbs and sugars.  Pt signed up for free diabetic class.  Encouraged exercise.  Follow up in 3 months.   Morbid obesity- discussed need for weight loss. Could certainly cause worsening of DM and GERD. Discussed calorie restriction and exercise.

## 2015-02-03 ENCOUNTER — Ambulatory Visit: Payer: BLUE CROSS/BLUE SHIELD | Admitting: Family Medicine

## 2015-02-10 ENCOUNTER — Ambulatory Visit (INDEPENDENT_AMBULATORY_CARE_PROVIDER_SITE_OTHER): Payer: BLUE CROSS/BLUE SHIELD

## 2015-02-10 ENCOUNTER — Encounter: Payer: Self-pay | Admitting: Family Medicine

## 2015-02-10 ENCOUNTER — Ambulatory Visit (INDEPENDENT_AMBULATORY_CARE_PROVIDER_SITE_OTHER): Payer: BLUE CROSS/BLUE SHIELD | Admitting: Family Medicine

## 2015-02-10 ENCOUNTER — Telehealth: Payer: Self-pay | Admitting: Family Medicine

## 2015-02-10 VITALS — BP 110/53 | HR 55 | Ht 60.0 in | Wt 309.0 lb

## 2015-02-10 DIAGNOSIS — I714 Abdominal aortic aneurysm, without rupture, unspecified: Secondary | ICD-10-CM

## 2015-02-10 DIAGNOSIS — R079 Chest pain, unspecified: Secondary | ICD-10-CM

## 2015-02-10 DIAGNOSIS — I517 Cardiomegaly: Secondary | ICD-10-CM | POA: Diagnosis not present

## 2015-02-10 DIAGNOSIS — M940 Chondrocostal junction syndrome [Tietze]: Secondary | ICD-10-CM

## 2015-02-10 LAB — RHEUMATOID FACTOR: Rhuematoid fact SerPl-aCnc: <10 IU/mL (ref <=14)

## 2015-02-10 LAB — TROPONIN I: Troponin I: <0.01 ng/mL (ref <0.06)

## 2015-02-10 MED ORDER — TRAMADOL HCL 50 MG PO TABS: 50.0000 mg | ORAL_TABLET | Freq: Three times a day (TID) | ORAL | Status: DC | PRN

## 2015-02-10 MED ORDER — PREDNISONE 20 MG PO TABS: ORAL_TABLET | ORAL | Status: AC

## 2015-02-10 NOTE — Telephone Encounter (Signed)
Seth Bake, Will you please let patient know that her xray and heart blood test was normal.  I suspect her pain is indeed due to costochondritis.  I've printed off two new medications she can take in place of meloxicam. (in you inbox)

## 2015-02-10 NOTE — Telephone Encounter (Signed)
Pt.notified

## 2015-02-10 NOTE — Progress Notes (Signed)
CC: Tamara Greer is a 49 y.o. female is here for Hospitalization Follow-up   Subjective: HPI:   hospital follow-up, on the first of this month she woke up around 4:30 in the morning with left-sided shoulder pain radiating to the left side of her chest. She was seen at a local emergency room and had a unremarkable EKG, unremarkable CTA of the chest and abdomen, and unremarkable blood work other than mild hyperglycemia and a initial troponin of 0.00 however a second troponin was 0.01 prior to her discharge. She's had persistent chest pain since discharge despite taking meloxicam for diagnosis of costochondritis. Her pain is reproducible with deep breathing or pressing on the sternum. It is nonradiating sharp and only localized at the sternum now. Is accompanied by shortness of breath when walking or lying down her back. No other interventions as of yet. Symptoms are mild in severity. Denies any fevers, chills, cough, skin changes, unintentional weight loss, abdominal pain  Review Of Systems Outlined In HPI  Past Medical History  Diagnosis Date  . Cancer     Past Surgical History  Procedure Laterality Date  . Abdominal hysterectomy    . Orthopedic surgery      steel rod placed in tibia   Family History  Problem Relation Age of Onset  . Diabetes Mother     History   Social History  . Marital Status: Married    Spouse Name: N/A  . Number of Children: N/A  . Years of Education: N/A   Occupational History  . Not on file.   Social History Main Topics  . Smoking status: Never Smoker   . Smokeless tobacco: Not on file  . Alcohol Use: No  . Drug Use: No  . Sexual Activity: Yes   Other Topics Concern  . Not on file   Social History Narrative     Objective: BP 110/53 mmHg  Pulse 55  Ht 5' (1.524 m)  Wt 309 lb (140.161 kg)  BMI 60.35 kg/m2  General: Alert and Oriented, No Acute Distress HEENT: Pupils equal, round, reactive to light. Conjunctivae clear.  Moist mucous  membranes Lungs: Clear to auscultation bilaterally, no wheezing/ronchi/rales.  Comfortable work of breathing. Good air movement. Cardiac: Regular rate and rhythm. Normal S1/S2.  No murmurs, rubs, nor gallops.   Chest: Pain is reproducible but only to a mild degree when pressing on the sternum Abdomen:obese and soft Extremities: No peripheral edema.  Strong peripheral pulses.  Mental Status: No depression, anxiety, nor agitation. Skin: Warm and dry.  Assessment & Plan: Tamara Greer was seen today for hospitalization follow-up.  Diagnoses and all orders for this visit:  Abdominal aortic aneurysm Orders: -     DG Chest 2 View; Future -     Troponin I -     Rheumatoid Factor -     Cyclic citrul peptide antibody, IgG  Chest pain, unspecified chest pain type Orders: -     DG Chest 2 View; Future -     Troponin I -     Rheumatoid Factor -     Cyclic citrul peptide antibody, IgG   Patient was reminded that her CTA of the chest and abdomen does not show any signs of a abdominal aortic aneurysm Chest pain: Given that her troponin level barely but did in fact rise I'm repeating this stat, cardiac etiology is low on the differential though given that her pain is reproducible with compression of the chest. Chest x-ray to rule out developing pulmonary process.  She would like to have labs to check for rheumatoid arthritis however I have informed her that there is a low likelihood 10 rheumatoid arthritis is causing her pain however orders will be placed per her request   Return if symptoms worsen or fail to improve.

## 2015-02-13 LAB — CYCLIC CITRUL PEPTIDE ANTIBODY, IGG

## 2015-02-15 ENCOUNTER — Other Ambulatory Visit: Payer: Self-pay | Admitting: Physician Assistant

## 2015-02-16 NOTE — Telephone Encounter (Signed)
Kelsi, This appears to be ok for this patient, Rx in the PA in box.

## 2015-04-18 ENCOUNTER — Ambulatory Visit: Payer: BLUE CROSS/BLUE SHIELD | Admitting: Osteopathic Medicine

## 2015-04-19 ENCOUNTER — Ambulatory Visit (INDEPENDENT_AMBULATORY_CARE_PROVIDER_SITE_OTHER): Payer: BLUE CROSS/BLUE SHIELD | Admitting: Osteopathic Medicine

## 2015-04-19 VITALS — BP 121/54 | HR 88 | Temp 98.2°F

## 2015-04-19 DIAGNOSIS — R7303 Prediabetes: Secondary | ICD-10-CM

## 2015-04-19 DIAGNOSIS — R7309 Other abnormal glucose: Secondary | ICD-10-CM | POA: Diagnosis not present

## 2015-04-19 DIAGNOSIS — R3989 Other symptoms and signs involving the genitourinary system: Secondary | ICD-10-CM

## 2015-04-19 DIAGNOSIS — R8290 Unspecified abnormal findings in urine: Secondary | ICD-10-CM

## 2015-04-19 DIAGNOSIS — R300 Dysuria: Secondary | ICD-10-CM

## 2015-04-19 DIAGNOSIS — R319 Hematuria, unspecified: Secondary | ICD-10-CM | POA: Diagnosis not present

## 2015-04-19 DIAGNOSIS — N3001 Acute cystitis with hematuria: Secondary | ICD-10-CM | POA: Diagnosis not present

## 2015-04-19 LAB — POCT URINALYSIS DIPSTICK
Bilirubin, UA: NEGATIVE
GLUCOSE UA: NEGATIVE
KETONES UA: NEGATIVE
Nitrite, UA: POSITIVE
Protein, UA: NEGATIVE
SPEC GRAV UA: 1.025
Urobilinogen, UA: 0.2
pH, UA: 6

## 2015-04-19 LAB — POCT GLYCOSYLATED HEMOGLOBIN (HGB A1C): HEMOGLOBIN A1C: 6.3

## 2015-04-19 MED ORDER — SULFAMETHOXAZOLE-TRIMETHOPRIM 800-160 MG PO TABS: 1.0000 | ORAL_TABLET | Freq: Two times a day (BID) | ORAL | Status: DC

## 2015-04-19 MED ORDER — PHENAZOPYRIDINE HCL 95 MG PO TABS: 95.0000 mg | ORAL_TABLET | Freq: Three times a day (TID) | ORAL | Status: DC | PRN

## 2015-04-19 NOTE — Patient Instructions (Signed)

## 2015-04-19 NOTE — Progress Notes (Signed)
Chief complaint: UTI  HPI: Tamara Greer is a 49 y.o. female who presents to [facility]  today with concerns for acute urinary tract infection  ACUTE: UTI symptoms -  Onset: 2 weeks Location: bladder/suprabupic Quality: Burning Exacerbating factors:   Frequency: yes  Hematuria: none  Odor: yes  Fever/chills: reports fever at home to 101 - has taken Advil, last dose last week  Incontinence: none  Flank Pain: some Last UTI - 1 year ago Recent abx: no abx past 3 mos Allergies: None  CHRONIC: Prediabetes (per patient) - Not taking Metformin per Jade's instructions, no labs in several months .  Past medical, social and family history reviewed: Past Medical History  Diagnosis Date  . Cancer    Past Surgical History  Procedure Laterality Date  . Abdominal hysterectomy    . Orthopedic surgery      steel rod placed in tibia   Social History  Substance Use Topics  . Smoking status: Never Smoker   . Smokeless tobacco: Not on file  . Alcohol Use: No    Medications: Current Outpatient Prescriptions  Medication Sig Dispense Refill  . Albuterol Sulfate (PROAIR RESPICLICK) 300 (90 BASE) MCG/ACT AEPB Inhale 2 puffs into the lungs every 4 (four) hours as needed. 1 each 1  . ALPRAZolam (XANAX) 0.5 MG tablet TAKE ONE TABLET BY MOUTH AT BEDTIME 60 tablet 0  . atorvastatin (LIPITOR) 40 MG tablet Take 1 tablet (40 mg total) by mouth daily. 90 tablet 4  . dexlansoprazole (DEXILANT) 60 MG capsule Take 1 capsule (60 mg total) by mouth daily. 30 capsule 6  . meloxicam (MOBIC) 15 MG tablet Take 15 mg by mouth daily as needed for pain.    . metoprolol succinate (TOPROL-XL) 25 MG 24 hr tablet TAKE 1 TABLET (25 MG TOTAL) BY MOUTH DAILY. 90 tablet 1  . olmesartan-hydrochlorothiazide (BENICAR HCT) 40-12.5 MG per tablet Take 1 tablet by mouth daily. 90 tablet 1  . traMADol (ULTRAM) 50 MG tablet Take 1 tablet (50 mg total) by mouth every 8 (eight) hours as needed  for moderate pain. 30 tablet 0  . metFORMIN (GLUCOPHAGE) 500 MG tablet Take 1 tablet (500 mg total) by mouth 2 (two) times daily with a meal. (Patient not taking: Reported on 04/19/2015) 60 tablet 2  . phenazopyridine (PYRIDIUM) 95 MG tablet Take 1 tablet (95 mg total) by mouth 3 (three) times daily as needed for pain. 10 tablet 0  . sulfamethoxazole-trimethoprim (BACTRIM DS,SEPTRA DS) 800-160 MG per tablet Take 1 tablet by mouth 2 (two) times daily. 10 tablet 0   No current facility-administered medications for this visit.   Note: Patient states she is not taking Metformin No Known Allergies    Review of Systems: CONSTITUTIONAL: Neg fever/chills currently, no unintentional weight changes HEAD/EYES/EARS/NOSE: No headache/vision change or hearing change CARDIAC: No chest pain/pressure/palpitations, no orthopnea RESPIRATORY: No cough/shortness of breath/wheeze GASTROINTESTINAL: No nausea/vomiting/abdominal pain/blood in stool/diarrhea/constipation GENITOURINARY: No incontinence, No abnormal genital bleeding/discharge, (+) UTI symptoms as above ENDOCRINE: No polyuria/polydipsia, no heat/cold intolerance  Exam:  BP 121/54 mmHg  Pulse 88 Constitutional: VSS, see above. General Appearance: alert, well-developed, well-nourished, NAD Respiratory: Normal respiratory effort. No dullness/hyper-resonance to percussion. Breath sounds normal, no wheeze/rhonchi/rales Cardiovascular: S1/S2 normal, no murmur/rub/gallop auscultated. No carotid bruit or JVD. No abdominal aortic bruit. Pedal pulse II/IV bilaterally DP and PT. No lower extremity edema. Gastrointestinal: Nontender, no masses. No hepatomegaly, no splenomegaly. No hernia appreciated. Rectal exam deferred.  Musculoskeletal: Lloyd's sign negative bilateral  Results for orders placed or performed in visit on 04/19/15 (from the past 72 hour(s))  POCT urinalysis dipstick     Status: Abnormal   Collection Time: 04/19/15  1:18 PM  Result Value  Ref Range   Color, UA yellow    Clarity, UA cloudy    Glucose, UA neg    Bilirubin, UA neg    Ketones, UA neg    Spec Grav, UA 1.025    Blood, UA trace-lysed    pH, UA 6.0    Protein, UA neg    Urobilinogen, UA 0.2    Nitrite, UA pos    Leukocytes, UA moderate (2+) (A) Negative  POCT HgB A1C     Status: None   Collection Time: 04/19/15  1:30 PM  Result Value Ref Range   Hemoglobin A1C 6.3    @[id number] @   ASSESSMENT/PLAN:  Acute cystitis with hematuria - Plan: sulfamethoxazole-trimethoprim (BACTRIM DS,SEPTRA DS) 800-160 MG per tablet, phenazopyridine (PYRIDIUM) 95 MG tablet  Dysuria - Plan: POCT urinalysis dipstick, Urine Culture, Urinalysis, Urinalysis, microscopic only, phenazopyridine (PYRIDIUM) 95 MG tablet  Hematuria - Plan: POCT urinalysis dipstick, Urine Culture, Urinalysis, Urinalysis, microscopic only  Abnormal urine odor - Plan: POCT urinalysis dipstick, Urine Culture, Urinalysis, Urinalysis, microscopic only  Abnormal urine color - Plan: POCT urinalysis dipstick, Urine Culture, Urinalysis, Urinalysis, microscopic only  Prediabetes - Plan: POCT HgB A1C  Needs to followup with Jade for routine care.   Reviewed previous records and labs - she is not taking Metformin, states Jade told her not to start this for prediabetes however most recent A1C 12/2014 was 6.7 indicating overt DM2, A1C improved today, recommend followup with Jade.   Patient has been educated on significant possible side effects of medication and is instructed to contact me or other medical professional with any concerns about side effects. Particularly warned about pyridium turing urine orange.

## 2015-04-20 LAB — URINALYSIS
BILIRUBIN URINE: NEGATIVE
Glucose, UA: NEGATIVE
KETONES UR: NEGATIVE
NITRITE: POSITIVE — AB
PROTEIN: NEGATIVE
SPECIFIC GRAVITY, URINE: 1.017 (ref 1.001–1.035)
pH: 5.5 (ref 5.0–8.0)

## 2015-04-20 LAB — URINALYSIS, MICROSCOPIC ONLY
Casts: NONE SEEN [LPF]
Crystals: NONE SEEN [HPF]
Yeast: NONE SEEN [HPF]

## 2015-04-22 LAB — URINE CULTURE: Colony Count: >=100000

## 2015-04-23 ENCOUNTER — Other Ambulatory Visit: Payer: Self-pay | Admitting: Family Medicine

## 2015-04-24 ENCOUNTER — Ambulatory Visit: Payer: BLUE CROSS/BLUE SHIELD | Admitting: Physician Assistant

## 2015-05-05 ENCOUNTER — Ambulatory Visit: Payer: BLUE CROSS/BLUE SHIELD | Admitting: Physician Assistant

## 2015-05-31 ENCOUNTER — Other Ambulatory Visit: Payer: Self-pay | Admitting: Physician Assistant

## 2015-06-01 ENCOUNTER — Other Ambulatory Visit: Payer: Self-pay | Admitting: Physician Assistant

## 2015-06-05 ENCOUNTER — Other Ambulatory Visit: Payer: Self-pay | Admitting: Physician Assistant

## 2015-06-19 ENCOUNTER — Ambulatory Visit (INDEPENDENT_AMBULATORY_CARE_PROVIDER_SITE_OTHER): Payer: BLUE CROSS/BLUE SHIELD | Admitting: Physician Assistant

## 2015-06-19 VITALS — BP 112/56 | HR 63 | Wt 304.0 lb

## 2015-06-19 DIAGNOSIS — R1012 Left upper quadrant pain: Secondary | ICD-10-CM

## 2015-06-19 DIAGNOSIS — E1169 Type 2 diabetes mellitus with other specified complication: Secondary | ICD-10-CM

## 2015-06-19 DIAGNOSIS — I10 Essential (primary) hypertension: Secondary | ICD-10-CM | POA: Diagnosis not present

## 2015-06-19 MED ORDER — METOPROLOL SUCCINATE ER 25 MG PO TB24
ORAL_TABLET | ORAL | Status: DC
Start: 2015-06-19 — End: 2015-10-13

## 2015-06-19 MED ORDER — ALPRAZOLAM 0.5 MG PO TABS: 0.5000 mg | ORAL_TABLET | Freq: Every day | ORAL | Status: DC

## 2015-06-19 MED ORDER — OLMESARTAN MEDOXOMIL-HCTZ 40-12.5 MG PO TABS: 1.0000 | ORAL_TABLET | Freq: Every day | ORAL | Status: DC

## 2015-06-19 MED ORDER — DEXLANSOPRAZOLE 60 MG PO CPDR: 60.0000 mg | DELAYED_RELEASE_CAPSULE | Freq: Every day | ORAL | Status: DC

## 2015-06-19 MED ORDER — RANITIDINE HCL 150 MG PO TABS: 150.0000 mg | ORAL_TABLET | Freq: Two times a day (BID) | ORAL | Status: DC

## 2015-06-19 NOTE — Patient Instructions (Signed)
[facility]

## 2015-06-20 LAB — CBC WITH DIFFERENTIAL/PLATELET
Basophils Absolute: 0 10*3/uL (ref 0.0–0.1)
Basophils Relative: 0 % (ref 0–1)
EOS ABS: 0.1 10*3/uL (ref 0.0–0.7)
EOS PCT: 2 % (ref 0–5)
HEMATOCRIT: 36.8 % (ref 36.0–46.0)
Hemoglobin: 12.8 g/dL (ref 12.0–15.0)
LYMPHS PCT: 31 % (ref 12–46)
Lymphs Abs: 2 10*3/uL (ref 0.7–4.0)
MCH: 31.7 pg (ref 26.0–34.0)
MCHC: 34.8 g/dL (ref 30.0–36.0)
MCV: 91.1 fL (ref 78.0–100.0)
MONO ABS: 0.5 10*3/uL (ref 0.1–1.0)
MPV: 11.7 fL (ref 8.6–12.4)
Monocytes Relative: 8 % (ref 3–12)
Neutro Abs: 3.8 10*3/uL (ref 1.7–7.7)
Neutrophils Relative %: 59 % (ref 43–77)
Platelets: 230 10*3/uL (ref 150–400)
RBC: 4.04 MIL/uL (ref 3.87–5.11)
RDW: 13.8 % (ref 11.5–15.5)
WBC: 6.4 10*3/uL (ref 4.0–10.5)

## 2015-06-20 LAB — COMPLETE METABOLIC PANEL WITH GFR
ALBUMIN: 4.2 g/dL (ref 3.6–5.1)
ALK PHOS: 102 U/L (ref 33–130)
ALT: 29 U/L (ref 6–29)
AST: 23 U/L (ref 10–35)
BILIRUBIN TOTAL: 0.7 mg/dL (ref 0.2–1.2)
BUN: 14 mg/dL (ref 7–25)
CALCIUM: 9.1 mg/dL (ref 8.6–10.4)
CO2: 21 mmol/L (ref 20–31)
CREATININE: 0.51 mg/dL (ref 0.50–1.05)
Chloride: 104 mmol/L (ref 98–110)
GFR, Est Non African American: >89 mL/min (ref >=60)
Glucose, Bld: 125 mg/dL — ABNORMAL HIGH (ref 65–99)
Potassium: 4.6 mmol/L (ref 3.5–5.3)
Sodium: 140 mmol/L (ref 135–146)
TOTAL PROTEIN: 7 g/dL (ref 6.1–8.1)

## 2015-06-20 LAB — HEMOGLOBIN A1C
Hgb A1c MFr Bld: 6.5 % — ABNORMAL HIGH (ref <5.7)
Mean Plasma Glucose: 140 mg/dL — ABNORMAL HIGH (ref <117)

## 2015-06-20 LAB — LIPASE: LIPASE: 20 U/L (ref 7–60)

## 2015-06-22 NOTE — Progress Notes (Signed)
   Subjective:    Patient ID: Tamara Greer, female    DOB: [birth year], 49 y.o.   MRN: [id number]  HPI Pt presents to the clinic for medication refill.   Pt is having LUQ again. Similar symptoms to earlier this year. No nausea or vomiting. No fever or chills. No bowel changes or blood in stool. No radiation to back. Taking dexilant daily.   DM- not checking sugars. Taking metformin daily. No hypoglycemic events. Not exercising. Trying to watch carbs and sugars. No increased thirst or neuropathy.   HtN- taking medication daily. No CP, palpiations, SOB, headache or dizziness.    Review of Systems  All other systems reviewed and are negative.      Objective:   Physical Exam  Constitutional: She is oriented to person, place, and time. She appears well-developed and well-nourished.  Obese, morbid.   HENT:  Head: Normocephalic and atraumatic.  Cardiovascular: Normal rate, regular rhythm and normal heart sounds.   Pulmonary/Chest: Effort normal and breath sounds normal.  Abdominal: Soft. Bowel sounds are normal. She exhibits no distension.  No rebound or guarding.  Mild tenderness over LUQ. No radiation to back.   Neurological: She is alert and oriented to person, place, and time.  Skin: Skin is dry.  Psychiatric: She has a normal mood and affect. Her behavior is normal.          Assessment & Plan:  LUQ pain- will check CBC, lipase and CMP. Suspect gasritis. Similar symptoms earlier this year with normal h.pylori and U/S.  Added zantac to dexilant. Follow up in 4 weeks. Discussed GERD diet.   DM, type II- .[name] Kitchen Lab Results  Component Value Date   HGBA1C 6.5* 06/19/2015   6.5 looks good.  Continue on metformin daily.  Pt declined flu shot and pneumonia vaccine. Will consider at next visit.  Needs mirco, foot exam, eye exam. Will update at next visit.    HTN- controlled, refilled benicar HCT and metoprolol.

## 2015-07-25 ENCOUNTER — Ambulatory Visit: Payer: BLUE CROSS/BLUE SHIELD | Admitting: Physician Assistant

## 2015-07-25 ENCOUNTER — Encounter: Payer: Self-pay | Admitting: Physician Assistant

## 2015-07-25 ENCOUNTER — Ambulatory Visit (INDEPENDENT_AMBULATORY_CARE_PROVIDER_SITE_OTHER): Payer: BLUE CROSS/BLUE SHIELD | Admitting: Physician Assistant

## 2015-07-25 VITALS — BP 132/49 | HR 69 | Ht 60.0 in | Wt 306.0 lb

## 2015-07-25 DIAGNOSIS — R1012 Left upper quadrant pain: Secondary | ICD-10-CM

## 2015-07-25 DIAGNOSIS — R140 Abdominal distension (gaseous): Secondary | ICD-10-CM

## 2015-07-25 DIAGNOSIS — R197 Diarrhea, unspecified: Secondary | ICD-10-CM | POA: Diagnosis not present

## 2015-07-25 DIAGNOSIS — R1011 Right upper quadrant pain: Secondary | ICD-10-CM | POA: Diagnosis not present

## 2015-07-25 HISTORY — DX: Abdominal distension (gaseous): R14.0

## 2015-07-25 HISTORY — DX: Diarrhea, unspecified: R19.7

## 2015-07-25 HISTORY — DX: Right upper quadrant pain: R10.11

## 2015-07-25 MED ORDER — DICYCLOMINE HCL 20 MG PO TABS: 20.0000 mg | ORAL_TABLET | Freq: Three times a day (TID) | ORAL | Status: DC

## 2015-07-25 NOTE — Patient Instructions (Addendum)
Get ultrasound tomorrow. Nothing to eat for 6 hours before.  Bentyl up to 4 times a day.   Irritable Bowel Syndrome, Adult Irritable bowel syndrome (IBS) is not one specific disease. It is a group of symptoms that affects the organs responsible for digestion (gastrointestinal or GI tract).  To regulate how your GI tract works, your body sends signals back and forth between your intestines and your brain. If you have IBS, there may be a problem with these signals. As a result, your GI tract does not function normally. Your intestines may become more sensitive and overreact to certain things. This is especially true when you eat certain foods or when you are under stress.  There are four types of IBS. These may be determined based on the consistency of your stool:   IBS with diarrhea.   IBS with constipation.   Mixed IBS.   Unsubtyped IBS.  It is important to know which type of IBS you have. Some treatments are more likely to be helpful for certain types of IBS.  CAUSES  The exact cause of IBS is not known. RISK FACTORS You may have a higher risk of IBS if:  You are a woman.  You are younger than 51 years old.  You have a family history of IBS.  You have mental health problems.  You have had bacterial infection of your GI tract. SIGNS AND SYMPTOMS  Symptoms of IBS vary from person to person. The main symptom is abdominal pain or discomfort. Additional symptoms usually include one or more of the following:   Diarrhea, constipation, or both.   Abdominal swelling or bloating.   Feeling full or sick after eating a small or regular-size meal.   Frequent gas.   Mucus in the stool.   A feeling of having more stool left after a bowel movement.  Symptoms tend to come and go. They may be associated with stress, psychiatric conditions, or nothing at all.  DIAGNOSIS  There is no specific test to diagnose IBS. Your health care provider will make a diagnosis based on a  physical exam, medical history, and your symptoms. You may have other tests to rule out other conditions that may be causing your symptoms. These may include:   Blood tests.   X-rays.   CT scan.  Endoscopy and colonoscopy. This is a test in which your GI tract is viewed with a long, thin, flexible tube. TREATMENT There is no cure for IBS, but treatment can help relieve symptoms. IBS treatment often includes:   Changes to your diet, such as:  Eating more fiber.  Avoiding foods that cause symptoms.  Drinking more water.  Eating regular, medium-sized portioned meals.  Medicines. These may include:  Fiber supplements if you have constipation.  Medicine to control diarrhea (antidiarrheal medicines).  Medicine to help control muscle spasms in your GI tract (antispasmodic medicines).  Medicines to help with any mental health issues, such as antidepressants or tranquilizers.  Therapy.  Talk therapy may help with anxiety, depression, or other mental health issues that can make IBS symptoms worse.  Stress reduction.  Managing your stress can help keep symptoms under control. HOME CARE INSTRUCTIONS   Take medicines only as directed by your health care provider.  Eat a healthy diet.  Avoid foods and drinks with added sugar.  Include more whole grains, fruits, and vegetables gradually into your diet. This may be especially helpful if you have IBS with constipation.  Avoid any foods and drinks that  make your symptoms worse. These may include dairy products and caffeinated or carbonated drinks.  Do not eat large meals.  Drink enough fluid to keep your urine clear or pale yellow.  Exercise regularly. Ask your health care provider for recommendations of good activities for you.  Keep all follow-up visits as directed by your health care provider. This is important. SEEK MEDICAL CARE IF:   You have constant pain.  You have trouble or pain with swallowing.  You have  worsening diarrhea. SEEK IMMEDIATE MEDICAL CARE IF:   You have severe and worsening abdominal pain.   You have diarrhea and:   You have a rash, stiff neck, or severe headache.   You are irritable, sleepy, or difficult to awaken.   You are weak, dizzy, or extremely thirsty.   You have bright red blood in your stool or you have black tarry stools.   You have unusual abdominal swelling that is painful.   You vomit continuously.   You vomit blood (hematemesis).   You have both abdominal pain and a fever.    This information is not intended to replace advice given to you by your health care provider. Make sure you discuss any questions you have with your health care provider.   Document Released: 08/19/2005 Document Revised: 09/09/2014 Document Reviewed: 05/06/2014 Elsevier Interactive Patient Education [employer].

## 2015-07-25 NOTE — Progress Notes (Signed)
   Subjective:    Patient ID: Tamara Greer, female    DOB: 08-17-1966, 49 y.o.   MRN: [id number]  HPI  Patient is a 49 year old female who presents to the clinic with ongoing right and left upper quadrant abdominal pain. In February/2016 abdominal ultrasound, H. pylori, CBC were done and negative for any causes of the pain. She was started on Dexilant 60 mg and pain seemed to resolve. In 06/2015 she came back because the pain had reappeared in the summer. She is now taking Dexilant and Zantac is not helping the symptoms. She reports having bloating and nausea after eating. Most foods cause the symptoms. Bagels and cream cheese seen to be the least symptomatic. The worst S fried chicken.she has no desire to eat.  She reports off and on diarrhea after all meals. She denies any black tarry stools or bright red blood in stools.  Not taking metformin due to GI side effects.   Review of Systems    see history of present illness. Objective:   Physical Exam  Constitutional: She is oriented to person, place, and time. She appears well-developed and well-nourished.  Morbidly obese.   HENT:  Head: Normocephalic and atraumatic.  Cardiovascular: Normal rate, regular rhythm and normal heart sounds.   Pulmonary/Chest: Effort normal and breath sounds normal. She has no wheezes.  Abdominal: Soft. Bowel sounds are normal.  No able to tell if distended due to abdominal fat.  Diffuse tenderness over entire abdomen to palpation but worse over RUQ.  No rebound or guarding.   Neurological: She is alert and oriented to person, place, and time.  Psychiatric: She has a normal mood and affect. Her behavior is normal.          Assessment & Plan:  Right and left upper quadrant abdominal pain/bloating/nausea-gallbladder disease versus irritable bowel syndrome. Her last abdominal ultrasound was in February and certainly things can change from then. Will get an abdominal ultrasound tomorrow morning to rule out  gallbladder disease. I'm strongly suspicious of some IBS symptoms. No red flags symptoms today. Patient was given Bentyl 20 mg to take up to 4 times a day before meals and bed. Patient was given an IBS handout and encouraged to keep a food diary. Continue on dexilant and zantac for the time being.   Diabetes type 2 controlled-patient has stopped metformin. Discussed we need to add a GL T-2 such as invokana or faraxia. Pt not wanted to start any new medications today. Will schedule appt to discuss after she is feeling better.

## 2015-07-26 ENCOUNTER — Ambulatory Visit (INDEPENDENT_AMBULATORY_CARE_PROVIDER_SITE_OTHER): Payer: BLUE CROSS/BLUE SHIELD

## 2015-07-26 DIAGNOSIS — R197 Diarrhea, unspecified: Secondary | ICD-10-CM | POA: Diagnosis not present

## 2015-07-26 DIAGNOSIS — R1011 Right upper quadrant pain: Secondary | ICD-10-CM | POA: Diagnosis not present

## 2015-07-26 DIAGNOSIS — R1012 Left upper quadrant pain: Secondary | ICD-10-CM | POA: Diagnosis not present

## 2015-07-26 DIAGNOSIS — R140 Abdominal distension (gaseous): Secondary | ICD-10-CM | POA: Diagnosis not present

## 2015-08-01 ENCOUNTER — Other Ambulatory Visit: Payer: Self-pay | Admitting: Physician Assistant

## 2015-08-07 ENCOUNTER — Ambulatory Visit (INDEPENDENT_AMBULATORY_CARE_PROVIDER_SITE_OTHER): Payer: BLUE CROSS/BLUE SHIELD | Admitting: Physician Assistant

## 2015-08-07 ENCOUNTER — Ambulatory Visit (INDEPENDENT_AMBULATORY_CARE_PROVIDER_SITE_OTHER): Payer: BLUE CROSS/BLUE SHIELD

## 2015-08-07 ENCOUNTER — Encounter: Payer: Self-pay | Admitting: Physician Assistant

## 2015-08-07 VITALS — BP 136/51 | HR 66 | Ht 60.0 in

## 2015-08-07 DIAGNOSIS — M5489 Other dorsalgia: Secondary | ICD-10-CM | POA: Diagnosis not present

## 2015-08-07 DIAGNOSIS — M50322 Other cervical disc degeneration at C5-C6 level: Secondary | ICD-10-CM | POA: Diagnosis not present

## 2015-08-07 DIAGNOSIS — M6248 Contracture of muscle, other site: Secondary | ICD-10-CM | POA: Diagnosis not present

## 2015-08-07 DIAGNOSIS — M546 Pain in thoracic spine: Secondary | ICD-10-CM | POA: Diagnosis not present

## 2015-08-07 DIAGNOSIS — M62838 Other muscle spasm: Secondary | ICD-10-CM

## 2015-08-07 MED ORDER — HYDROCODONE-ACETAMINOPHEN 7.5-325 MG PO TABS: 1.0000 | ORAL_TABLET | Freq: Three times a day (TID) | ORAL | Status: DC | PRN

## 2015-08-07 MED ORDER — MELOXICAM 15 MG PO TABS: 15.0000 mg | ORAL_TABLET | Freq: Every day | ORAL | Status: DC

## 2015-08-07 MED ORDER — METHYLPREDNISOLONE ACETATE 40 MG/ML IJ SUSP
40.0000 mg | Freq: Once | INTRAMUSCULAR | Status: AC
  Administered 2015-08-07: 40 mg via INTRAMUSCULAR

## 2015-08-07 MED ORDER — CYCLOBENZAPRINE HCL 10 MG PO TABS: 10.0000 mg | ORAL_TABLET | Freq: Three times a day (TID) | ORAL | Status: DC | PRN

## 2015-08-07 MED ORDER — KETOROLAC TROMETHAMINE 30 MG/ML IJ SOLN
30.0000 mg | Freq: Once | INTRAMUSCULAR | Status: AC
  Administered 2015-08-07: 30 mg via INTRAMUSCULAR

## 2015-08-07 NOTE — Patient Instructions (Addendum)
Discussed ROM exercise and warm compresses Mobic daily starting tomorrow since got injection today. Muscle relaxers as needed up to three times a day. Sedation warning.  norco as needed every 6 hrs as needed for pain.   Muscle Cramps and Spasms Muscle cramps and spasms occur when a muscle or muscles tighten and you have no control over this tightening (involuntary muscle contraction). They are a common problem and can develop in any muscle. The most common place is in the calf muscles of the leg. Both muscle cramps and muscle spasms are involuntary muscle contractions, but they also have differences:   Muscle cramps are sporadic and painful. They may last a few seconds to a quarter of an hour. Muscle cramps are often more forceful and last longer than muscle spasms.  Muscle spasms may or may not be painful. They may also last just a few seconds or much longer. CAUSES  It is uncommon for cramps or spasms to be due to a serious underlying problem. In many cases, the cause of cramps or spasms is unknown. Some common causes are:   Overexertion.   Overuse from repetitive motions (doing the same thing over and over).   Remaining in a certain position for a long period of time.   Improper preparation, form, or technique while performing a sport or activity.   Dehydration.   Injury.   Side effects of some medicines.   Abnormally low levels of the salts and ions in your blood (electrolytes), especially potassium and calcium. This could happen if you are taking water pills (diuretics) or you are pregnant.  Some underlying medical problems can make it more likely to develop cramps or spasms. These include, but are not limited to:   Diabetes.   Parkinson disease.   Hormone disorders, such as thyroid problems.   Alcohol abuse.   Diseases specific to muscles, joints, and bones.   Blood vessel disease where not enough blood is getting to the muscles.  HOME CARE INSTRUCTIONS     Stay well hydrated. Drink enough water and fluids to keep your urine clear or pale yellow.  It may be helpful to massage, stretch, and relax the affected muscle.  For tight or tense muscles, use a warm towel, heating pad, or hot shower water directed to the affected area.  If you are sore or have pain after a cramp or spasm, applying ice to the affected area may relieve discomfort.  Put ice in a plastic bag.  Place a towel between your skin and the bag.  Leave the ice on for 15-20 minutes, 03-04 times a day.  Medicines used to treat a known cause of cramps or spasms may help reduce their frequency or severity. Only take over-the-counter or prescription medicines as directed by your caregiver. SEEK MEDICAL CARE IF:  Your cramps or spasms get more severe, more frequent, or do not improve over time.  MAKE SURE YOU:   Understand these instructions.  Will watch your condition.  Will get help right away if you are not doing well or get worse.   This information is not intended to replace advice given to you by your health care provider. Make sure you discuss any questions you have with your health care provider.   Document Released: 02/08/2002 Document Revised: 12/14/2012 Document Reviewed: 08/05/2012 Elsevier Interactive Patient Education [employer].

## 2015-08-07 NOTE — Progress Notes (Signed)
   Subjective:    Patient ID: Tamara Greer, female    DOB: 1966-07-11, 49 y.o.   MRN: [id number]  HPI  Patient is a 50 year old female who presents to the clinic with her husband in 79 out of 10 pain complaining of upper back and into neck symptoms. She denies any injury. She woke up yesterday and this type of pain. She is taking tramadol but it is not touching it. It is worse with movement and touch. She has been rolling in the bed due to this pain. She denies any nausea or vomiting. She denies any pain with urination. She describes the pain only from her bra line up into her neck. She denies any fever, chills, body aches.    Review of Systems  All other systems reviewed and are negative.      Objective:   Physical Exam  Constitutional: She is oriented to person, place, and time.  Laying on exam room table crying in pain.  Hard to even sit up.  Needed wheelchair due to pain.   HENT:  Head: Normocephalic and atraumatic.  Pulmonary/Chest: Effort normal and breath sounds normal.  No CVA tenderness.   Musculoskeletal:  Extreme tenderness with palpation over upper back muscles and paraspinous muscles. Some pain with palpation over c-spine.  ROM neck decreased with forward flexion and extension not with side to side movements.  Normal strength and ROM of bilateral shoulders.   Neurological: She is alert and oriented to person, place, and time.  Skin: Skin is dry.  Psychiatric: She has a normal mood and affect. Her behavior is normal.          Assessment & Plan:  Muscle spasm of upper back and neck/mid back pain- I do not think there is any structural issues or disc herniation however we will get x-ray of cervical and thoracic spine. Symptoms are consistent with muscle spasms. toradol 30mg  IM and depo medrol 40mg  IM in office today. Given mobic daily with flexeril as needed. Norco as needed for pain. Warm compresses and discussed exercises for ROM with patient. Pt does not toleratae  oral prednisone well.

## 2015-08-08 ENCOUNTER — Other Ambulatory Visit: Payer: Self-pay | Admitting: Physician Assistant

## 2015-08-08 ENCOUNTER — Encounter: Payer: Self-pay | Admitting: Physician Assistant

## 2015-08-08 DIAGNOSIS — M62838 Other muscle spasm: Secondary | ICD-10-CM | POA: Insufficient documentation

## 2015-08-08 DIAGNOSIS — M5030 Other cervical disc degeneration, unspecified cervical region: Secondary | ICD-10-CM

## 2015-08-08 DIAGNOSIS — M549 Dorsalgia, unspecified: Secondary | ICD-10-CM | POA: Insufficient documentation

## 2015-08-08 HISTORY — DX: Dorsalgia, unspecified: M54.9

## 2015-08-08 HISTORY — DX: Other muscle spasm: M62.838

## 2015-08-08 HISTORY — DX: Other cervical disc degeneration, unspecified cervical region: M50.30

## 2015-08-08 MED ORDER — CARISOPRODOL 250 MG PO TABS: 250.0000 mg | ORAL_TABLET | Freq: Four times a day (QID) | ORAL | Status: DC

## 2015-08-17 ENCOUNTER — Telehealth: Payer: Self-pay

## 2015-08-17 NOTE — Telephone Encounter (Signed)
Tamara Greer is very upset with our office. She didn't want me to send this message. She states the muscle relaxer is not helping with her back pain and the tramadol doesn't help at all. She wanted to know if steroid injections would help her back pain.

## 2015-08-17 NOTE — Telephone Encounter (Signed)
im not sure why she is upset. The only way we know if a patient is getting better or NOT is if they communicate with [place]. We sent in stronger muscle relaxer last I heard. If she was not improving she was to see sports medicine doctor in office. She had a generalized steroid injection (depo medrol in office) she does not tolerate oral prednisone so we could not proceed with that. Steroid injections in the back would need MRI before proceeding. I don't mind ordering a MRI but once again I did not know she had not improving as to move forward with the process. Confirm she is taking soma. I also don't mind giving a small quanity of norco if helped when the pain worsening. Please ask if there are any new symptom?

## 2015-08-18 NOTE — Telephone Encounter (Signed)
Left message for a return call

## 2015-10-11 ENCOUNTER — Encounter: Payer: BLUE CROSS/BLUE SHIELD | Admitting: Physician Assistant

## 2015-10-13 ENCOUNTER — Encounter: Payer: Self-pay | Admitting: Physician Assistant

## 2015-10-13 ENCOUNTER — Ambulatory Visit (INDEPENDENT_AMBULATORY_CARE_PROVIDER_SITE_OTHER): Payer: BLUE CROSS/BLUE SHIELD | Admitting: Physician Assistant

## 2015-10-13 VITALS — BP 112/48 | HR 67 | Ht 60.0 in | Wt 308.0 lb

## 2015-10-13 DIAGNOSIS — T887XXD Unspecified adverse effect of drug or medicament, subsequent encounter: Secondary | ICD-10-CM

## 2015-10-13 DIAGNOSIS — K2100 Gastro-esophageal reflux disease with esophagitis, without bleeding: Secondary | ICD-10-CM

## 2015-10-13 DIAGNOSIS — R7303 Prediabetes: Secondary | ICD-10-CM

## 2015-10-13 DIAGNOSIS — E785 Hyperlipidemia, unspecified: Secondary | ICD-10-CM | POA: Diagnosis not present

## 2015-10-13 DIAGNOSIS — F411 Generalized anxiety disorder: Secondary | ICD-10-CM

## 2015-10-13 DIAGNOSIS — M170 Bilateral primary osteoarthritis of knee: Secondary | ICD-10-CM

## 2015-10-13 DIAGNOSIS — T50905D Adverse effect of unspecified drugs, medicaments and biological substances, subsequent encounter: Secondary | ICD-10-CM

## 2015-10-13 DIAGNOSIS — Z0001 Encounter for general adult medical examination with abnormal findings: Secondary | ICD-10-CM | POA: Diagnosis not present

## 2015-10-13 DIAGNOSIS — I10 Essential (primary) hypertension: Secondary | ICD-10-CM

## 2015-10-13 DIAGNOSIS — I714 Abdominal aortic aneurysm, without rupture, unspecified: Secondary | ICD-10-CM

## 2015-10-13 DIAGNOSIS — R6889 Other general symptoms and signs: Secondary | ICD-10-CM | POA: Diagnosis not present

## 2015-10-13 DIAGNOSIS — Z114 Encounter for screening for human immunodeficiency virus [HIV]: Secondary | ICD-10-CM

## 2015-10-13 DIAGNOSIS — Z79899 Other long term (current) drug therapy: Secondary | ICD-10-CM

## 2015-10-13 DIAGNOSIS — Z1159 Encounter for screening for other viral diseases: Secondary | ICD-10-CM

## 2015-10-13 HISTORY — DX: Morbid (severe) obesity due to excess calories: E66.01

## 2015-10-13 HISTORY — DX: Prediabetes: R73.03

## 2015-10-13 HISTORY — DX: Hyperlipidemia, unspecified: E78.5

## 2015-10-13 LAB — COMPLETE METABOLIC PANEL WITH GFR
ALT: 22 U/L (ref 6–29)
AST: 25 U/L (ref 10–35)
Albumin: 3.9 g/dL (ref 3.6–5.1)
Alkaline Phosphatase: 104 U/L (ref 33–130)
BILIRUBIN TOTAL: 0.7 mg/dL (ref 0.2–1.2)
BUN: 13 mg/dL (ref 7–25)
CALCIUM: 8.8 mg/dL (ref 8.6–10.4)
CO2: 23 mmol/L (ref 20–31)
CREATININE: 0.5 mg/dL (ref 0.50–1.05)
Chloride: 102 mmol/L (ref 98–110)
GFR, Est Non African American: >89 mL/min (ref >=60)
Glucose, Bld: 118 mg/dL — ABNORMAL HIGH (ref 65–99)
Potassium: 4.7 mmol/L (ref 3.5–5.3)
Sodium: 136 mmol/L (ref 135–146)
TOTAL PROTEIN: 7 g/dL (ref 6.1–8.1)

## 2015-10-13 LAB — LIPID PANEL
CHOLESTEROL: 158 mg/dL (ref 125–200)
HDL: 54 mg/dL (ref >=46)
LDL Cholesterol: 85 mg/dL (ref <130)
TRIGLYCERIDES: 95 mg/dL (ref <150)
Total CHOL/HDL Ratio: 2.9 Ratio (ref <=5.0)
VLDL: 19 mg/dL (ref <30)

## 2015-10-13 LAB — HEMOGLOBIN A1C
HEMOGLOBIN A1C: 6.2 % — AB (ref <5.7)
MEAN PLASMA GLUCOSE: 131 mg/dL — AB (ref <117)

## 2015-10-13 MED ORDER — HYDROCHLOROTHIAZIDE 25 MG PO TABS: 25.0000 mg | ORAL_TABLET | Freq: Every day | ORAL | Status: DC

## 2015-10-13 MED ORDER — HYDROCODONE-ACETAMINOPHEN 5-325 MG PO TABS: ORAL_TABLET | ORAL | Status: DC

## 2015-10-13 MED ORDER — LIRAGLUTIDE -WEIGHT MANAGEMENT 18 MG/3ML ~~LOC~~ SOPN: 0.6000 mg | PEN_INJECTOR | Freq: Every day | SUBCUTANEOUS | Status: DC

## 2015-10-13 MED ORDER — OLMESARTAN MEDOXOMIL 20 MG PO TABS: 20.0000 mg | ORAL_TABLET | Freq: Every day | ORAL | Status: DC

## 2015-10-13 MED ORDER — CELECOXIB 200 MG PO CAPS: 200.0000 mg | ORAL_CAPSULE | Freq: Two times a day (BID) | ORAL | Status: DC

## 2015-10-13 MED ORDER — PANTOPRAZOLE SODIUM 40 MG PO TBEC: 40.0000 mg | DELAYED_RELEASE_TABLET | Freq: Every day | ORAL | Status: DC

## 2015-10-13 MED ORDER — METOPROLOL SUCCINATE ER 25 MG PO TB24: ORAL_TABLET | ORAL | Status: DC

## 2015-10-13 MED ORDER — ALPRAZOLAM 0.5 MG PO TABS: 0.5000 mg | ORAL_TABLET | Freq: Every day | ORAL | Status: DC

## 2015-10-13 NOTE — Patient Instructions (Signed)

## 2015-10-13 NOTE — Progress Notes (Signed)
Subjective:     Tamara Greer is a 50 y.o. female and is here for a comprehensive physical exam. The patient reports problems - she presented to clinic a few months back with severe muscle spams. they stopped with stopping dexilant. now her GERD is out of control. she is on zantac bid. recently she had a 81 yo friend die and it was a wake up call. she wants to get healthier. due to bilateral knee pain hard to exercise. ortho will not do surgery due to weight. she cannot lose weight due to pain. .  Social History   Social History  . Marital Status: Married    Spouse Name: N/A  . Number of Children: N/A  . Years of Education: N/A   Occupational History  . Not on file.   Social History Main Topics  . Smoking status: Never Smoker   . Smokeless tobacco: Not on file  . Alcohol Use: No  . Drug Use: No  . Sexual Activity: Yes   Other Topics Concern  . Not on file   Social History Narrative   Health Maintenance  Topic Date Due  . Hepatitis C Screening  [birth year]  . PNEUMOCOCCAL POLYSACCHARIDE VACCINE (1) 02/17/1966  . FOOT EXAM  02/17/1974  . OPHTHALMOLOGY EXAM  02/17/1974  . HIV Screening  02/18/1979  . INFLUENZA VACCINE  10/12/2016 (Originally 04/03/2015)  . HEMOGLOBIN A1C  12/18/2015  . MAMMOGRAM  06/08/2016  . COLONOSCOPY  09/03/2019  . TETANUS/TDAP  05/16/2024    The following portions of the patient's history were reviewed and updated as appropriate: allergies, current medications, past family history, past medical history, past social history, past surgical history and problem list.  Review of Systems Pertinent items noted in HPI and remainder of comprehensive ROS otherwise negative.   Objective:    BP 112/48 mmHg  Pulse 67  Ht 5' (1.524 m)  Wt 308 lb (139.708 kg)  BMI 60.15 kg/m2 General appearance: alert, cooperative, appears stated age and morbidly obese Head: Normocephalic, without obvious abnormality, atraumatic Eyes: conjunctivae/corneas clear. PERRL,  EOM's intact. Fundi benign. Ears: normal TM's and external ear canals both ears Nose: Nares normal. Septum midline. Mucosa normal. No drainage or sinus tenderness. Throat: lips, mucosa, and tongue normal; teeth and gums normal Neck: no adenopathy, no carotid bruit, no JVD, supple, symmetrical, trachea midline and thyroid not enlarged, symmetric, no tenderness/mass/nodules Back: symmetric, no curvature. ROM normal. No CVA tenderness. Lungs: clear to auscultation bilaterally Heart: regular rate and rhythm, S1, S2 normal, no murmur, click, rub or gallop Abdomen: soft, non-tender; bowel sounds normal; no masses,  no organomegaly Extremities: edema 1+ bilateral lower extremity edema, some swelling of bilateral fingers.  Pulses: 2+ and symmetric Skin: Skin color, texture, turgor normal. No rashes or lesions Lymph nodes: Cervical, supraclavicular, and axillary nodes normal. Neurologic: Alert and oriented X 3, normal strength and tone. Normal symmetric reflexes. Normal coordination and gait    Assessment:    Healthy female exam.      Plan:  CPE- hysterectomy(no need for pap). Mammogram done. Lipid and cmp ordered today. HIV and Hep C ordered. See below on weight loss. Discussed daily ASA, vitamin d, and calcium.   Pre-diabetes- ordered A1C. Pt did not tolerate metformin. She does not want to start a medication.   Hyperlipidemia- lipid to be checked.   Medication reaction- added dexilant to allergy list. Symptoms resolved today.   HTN- benicar 20mg  continue.   Swelling of extremities- added HCTZ 25mg  daily. Discussed low salt  diet.   GERD- protonix is in the same family but worth a try to add to zantac. Stop if any adverse symptoms.   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 Continue on BB.   Osteoarthritis of bilateral knees- pain contract given and signed. norco no more than twice a day. After protonix gets in system consider trying celebrex to control inflammation and pain. Goal  is weight loss.   Morbid obesity- I want to avoid phentermine due to HTN and anxiety. Discussed saxenda. Discussed how to use and SE's. Card given. As we get knee pain under control hopefully can start exercising more. Consider low impact exercising in water. Follow up in 6 weeks.   Anxiety- xanax as needed refilled.    See After Visit Summary for Counseling Recommendations

## 2015-10-14 LAB — DRUG SCREEN, URINE
AMPHETAMINE SCRN UR: NEGATIVE
BENZODIAZEPINES.: POSITIVE — AB
Barbiturate Quant, Ur: NEGATIVE
COCAINE METABOLITES: NEGATIVE
CREATININE, U: 149.83 mg/dL
MARIJUANA METABOLITE: NEGATIVE
Methadone: NEGATIVE
Opiates: NEGATIVE
PHENCYCLIDINE (PCP): NEGATIVE
Propoxyphene: NEGATIVE

## 2015-10-14 LAB — HEPATITIS C ANTIBODY: HCV Ab: NEGATIVE

## 2015-10-14 LAB — HIV ANTIBODY (ROUTINE TESTING W REFLEX): HIV: NONREACTIVE

## 2015-10-24 ENCOUNTER — Telehealth: Payer: Self-pay | Admitting: *Deleted

## 2015-10-24 NOTE — Telephone Encounter (Signed)
Celecoxib 200 mg approved from 09-24-2015 through 10-23-2016 Pharmacy and pt notified via vm Case ID [id number]

## 2015-11-13 ENCOUNTER — Other Ambulatory Visit: Payer: Self-pay | Admitting: Physician Assistant

## 2015-11-15 ENCOUNTER — Encounter: Payer: Self-pay | Admitting: Physician Assistant

## 2015-11-15 ENCOUNTER — Ambulatory Visit (INDEPENDENT_AMBULATORY_CARE_PROVIDER_SITE_OTHER): Payer: BLUE CROSS/BLUE SHIELD | Admitting: Physician Assistant

## 2015-11-15 DIAGNOSIS — I714 Abdominal aortic aneurysm, without rupture, unspecified: Secondary | ICD-10-CM

## 2015-11-15 DIAGNOSIS — I10 Essential (primary) hypertension: Secondary | ICD-10-CM | POA: Diagnosis not present

## 2015-11-15 NOTE — Progress Notes (Signed)
   Subjective:    Patient ID: Tamara Greer, female    DOB: 25-Apr-1966, 50 y.o.   MRN: [id number]  HPI   patient is a 50  -year-old female who presents to the clinic to discuss her abdominal ultrasound to follow-up on aortic abdominal aneurysm last checked was around 3.7 cm before scan on 11/06/15. She sees a vascular with Dr. Raul Del. She had a ultrasound that she has not been call back. This makes her very frustrated. She would like to switch vascular surgeons.  She would also like to know the results of her ultrasound today.   Kirke Shaggy was denied and patient was required to start 3 months of diet and exercise before being approved. She would like to discuss details today.    Review of Systems  All other systems reviewed and are negative.      Objective:   Physical Exam  Constitutional: She is oriented to person, place, and time. She appears well-developed and well-nourished.  Morbid obesity  Cardiovascular: Normal rate, regular rhythm and normal heart sounds.   Neurological: She is alert and oriented to person, place, and time.  Psychiatric: She has a normal mood and affect. Her behavior is normal.          Assessment & Plan:  AAA-  Discussed results of [place] with patient. Dilation is decreasing from 3.7 to 2.9cm. Reassured patient. Referral sent to new vascular surgeon. Continue on metoprolol.   HTN- continue benicar, HCTZ, metoprolol. Follow up in 3 months.   Morbid obesity- discussed 1500-1800 calorie diet with biking or pool exercise due to knee pain. Consider meal replacement shakes to help with hunger and bring good nutrition. Goal weight loss is 50lbs so that she can get much needed knee replacement surgery. Hopefully we can get saxenda appproved after 3 months to help with weight loss. Follow up in 3 months. Did not want to try phentermine.

## 2015-11-15 NOTE — Patient Instructions (Signed)
Interface, External Ris In - 11/06/2015 1:51 PM EST  Procedure: This exam consists of 2D gray scale imaging, color Doppler, and pulsed wave Doppler analysis of the abdominal aorta and iliac arteries. Previous: Previous outside exam performed on 10/13/14 demonstrated a AAA with a maximum diameter of 3.6 cm, and was noted to be an extremely limited study due to body habitus. Right: No significant common iliac artery dilatation. Left: No significant common iliac artery dilatation. Aorta: The infrarenal abdominal aorta is dilated, with a maximum diameter of 3.01 cm in the sagittal plane. Unable to duplicate this measurement in the transverse plane, which has a maximum diameter of 2.9 cm. Technically difficult study due to body  habitus. Results confirmed by two technologists. Conclusions: Infrarenal abdominal aortic aneurysm with a maximum diameter of 3.01 cm in the sagittal plane and a maximum diameter of 2.9 cm in the transverse plane, as described above. No evidence of common iliac artery aneurysm bilaterally.  Technically difficult study.

## 2015-11-17 ENCOUNTER — Ambulatory Visit: Payer: BLUE CROSS/BLUE SHIELD | Admitting: Physician Assistant

## 2015-11-24 ENCOUNTER — Encounter: Payer: Self-pay | Admitting: Vascular Surgery

## 2015-12-05 ENCOUNTER — Encounter: Payer: BLUE CROSS/BLUE SHIELD | Admitting: Vascular Surgery

## 2015-12-13 ENCOUNTER — Encounter: Payer: Self-pay | Admitting: Vascular Surgery

## 2015-12-20 ENCOUNTER — Encounter: Payer: BLUE CROSS/BLUE SHIELD | Admitting: Vascular Surgery

## 2015-12-26 ENCOUNTER — Ambulatory Visit (INDEPENDENT_AMBULATORY_CARE_PROVIDER_SITE_OTHER): Payer: BLUE CROSS/BLUE SHIELD | Admitting: Physician Assistant

## 2015-12-26 ENCOUNTER — Encounter: Payer: Self-pay | Admitting: Physician Assistant

## 2015-12-26 VITALS — BP 127/58 | HR 71 | Ht 60.0 in | Wt 310.0 lb

## 2015-12-26 DIAGNOSIS — G4700 Insomnia, unspecified: Secondary | ICD-10-CM | POA: Diagnosis not present

## 2015-12-26 DIAGNOSIS — L309 Dermatitis, unspecified: Secondary | ICD-10-CM

## 2015-12-26 DIAGNOSIS — T148 Other injury of unspecified body region: Secondary | ICD-10-CM

## 2015-12-26 DIAGNOSIS — Z79899 Other long term (current) drug therapy: Secondary | ICD-10-CM

## 2015-12-26 DIAGNOSIS — M170 Bilateral primary osteoarthritis of knee: Secondary | ICD-10-CM

## 2015-12-26 DIAGNOSIS — T148XXA Other injury of unspecified body region, initial encounter: Secondary | ICD-10-CM

## 2015-12-26 MED ORDER — TRAZODONE HCL 50 MG PO TABS: 25.0000 mg | ORAL_TABLET | Freq: Every evening | ORAL | Status: DC | PRN

## 2015-12-26 MED ORDER — HYDROCODONE-ACETAMINOPHEN 5-325 MG PO TABS: ORAL_TABLET | ORAL | Status: DC

## 2015-12-26 MED ORDER — TRIAMCINOLONE ACETONIDE 0.1 % EX CREA: 1.0000 "application " | TOPICAL_CREAM | Freq: Two times a day (BID) | CUTANEOUS | Status: DC

## 2015-12-26 NOTE — Addendum Note (Signed)
Addended by: Donella Stade on: 12/26/2015 12:18 PM   Modules accepted: Level of Service

## 2015-12-26 NOTE — Patient Instructions (Addendum)
unisom ok.  Melatonin 3mg  up to 10mg - few weeks.  Belsomnra

## 2015-12-26 NOTE — Progress Notes (Addendum)
   Subjective:    Patient ID: Tamara Greer, female    DOB: [birth year], 50 y.o.   MRN: [id number]  HPI Patient presents today complaining of several skin abnormalities.   She has a bruise on the dorsal aspect of her left foot. She cannot recall injuring her foot. She denies any abnormal bleeding, nosebleeds, or gum bleeding. She has a history of anemia but is not currently taking any iron supplements  She is also complaining of an itchy, red patch on the anterior portion of her ankle. She states this patch randomly appeared 2-3 weeks ago and was bright red at first. The color has since faded some. She states it was extremely itchy at first and is somewhat tender to touch. She has been using neosporin over this patch and states she feels it is improving.   This morning, she noticed a red/purple area on the inside of her left arm as she was getting in the shower. She states she has not injured this area and denies bug bites or recent injections into the arm. She denies itchiness and states that the area is not painful or tender at all.   Patient also reports difficulty falling and staying asleep.  Her mother is currently in a nursing home and she states she thinks about her mom a lot at nighttime which makes it difficult to sleep. She has been taking Xanax to help with sleep but it is not working.   Review of Systems  All other systems reviewed and are negative.      Objective:   Physical Exam  Constitutional: She appears well-developed and well-nourished.  Morbidly obese   Cardiovascular: Normal rate, regular rhythm and normal heart sounds.   Pulmonary/Chest: Effort normal and breath sounds normal.  Skin: Skin is warm and dry.             Assessment & Plan:  1. Eczema of the left ankle- Dusky red colored macule with overlying scale visualized on the anterior aspect of the ankle where the tongue of her shoe contacts her skin. Patient reports itching. Eczema likely due to constant  skin irritation from shoe. Will send Triamcinolone 0.1% cream to use as needed.   2. Bruising- Patient has two areas of bruisining today. She is currently taking Celebrex, Lipitor, and a daily aspirin. Discussed risk of bleeding due to these medications. Advised patient to have CBC done to determine platelet count. Patient denied.   3. Insomnia- Patient reports difficulty falling and staying asleep that is not treated with Xanax. Will send Trazadone 50 mg for patient to take 1/2 tab nightly. Side effects discussed. OTC options with melatonin and unisom discussed. Follow up as needed.   4. Morbid obesity/Primary osteoarthritis of both knees- Patient is taking Norco 2x daily and states she is doing well on this. She denies illicit drug use. UDS done today to confirm. Pain contract renewed today. Post dated 3 prescriptions for Norco 5-325 mg tablets. Discussed weight loss. She is still not ready to consider medication. She is trying to cut calories but concerned about medication.

## 2015-12-27 LAB — DRUG SCREEN, URINE
Amphetamine Screen, Ur: NEGATIVE
Barbiturate Quant, Ur: NEGATIVE
Benzodiazepines.: NEGATIVE
CREATININE, U: 52.5 mg/dL
Cocaine Metabolites: NEGATIVE
MARIJUANA METABOLITE: NEGATIVE
METHADONE: NEGATIVE
Opiates: POSITIVE — AB
Phencyclidine (PCP): NEGATIVE
Propoxyphene: NEGATIVE

## 2016-01-26 DIAGNOSIS — G4733 Obstructive sleep apnea (adult) (pediatric): Secondary | ICD-10-CM | POA: Diagnosis not present

## 2016-02-21 ENCOUNTER — Other Ambulatory Visit: Payer: Self-pay | Admitting: Physician Assistant

## 2016-02-29 ENCOUNTER — Ambulatory Visit: Payer: BLUE CROSS/BLUE SHIELD | Admitting: Osteopathic Medicine

## 2016-03-01 DIAGNOSIS — H00025 Hordeolum internum left lower eyelid: Secondary | ICD-10-CM | POA: Diagnosis not present

## 2016-03-04 ENCOUNTER — Ambulatory Visit: Payer: BLUE CROSS/BLUE SHIELD | Admitting: Physician Assistant

## 2016-03-08 ENCOUNTER — Ambulatory Visit: Payer: BLUE CROSS/BLUE SHIELD | Admitting: Physician Assistant

## 2016-03-12 ENCOUNTER — Ambulatory Visit (INDEPENDENT_AMBULATORY_CARE_PROVIDER_SITE_OTHER): Payer: BLUE CROSS/BLUE SHIELD | Admitting: Physician Assistant

## 2016-03-12 ENCOUNTER — Encounter: Payer: Self-pay | Admitting: Physician Assistant

## 2016-03-12 VITALS — BP 119/55 | HR 67 | Wt 315.0 lb

## 2016-03-12 DIAGNOSIS — R1012 Left upper quadrant pain: Secondary | ICD-10-CM

## 2016-03-12 DIAGNOSIS — M5030 Other cervical disc degeneration, unspecified cervical region: Secondary | ICD-10-CM | POA: Diagnosis not present

## 2016-03-12 DIAGNOSIS — Z1211 Encounter for screening for malignant neoplasm of colon: Secondary | ICD-10-CM

## 2016-03-12 DIAGNOSIS — H00013 Hordeolum externum right eye, unspecified eyelid: Secondary | ICD-10-CM

## 2016-03-12 DIAGNOSIS — H01003 Unspecified blepharitis right eye, unspecified eyelid: Secondary | ICD-10-CM | POA: Diagnosis not present

## 2016-03-12 DIAGNOSIS — K2100 Gastro-esophageal reflux disease with esophagitis, without bleeding: Secondary | ICD-10-CM

## 2016-03-12 DIAGNOSIS — R197 Diarrhea, unspecified: Secondary | ICD-10-CM

## 2016-03-12 DIAGNOSIS — R110 Nausea: Secondary | ICD-10-CM

## 2016-03-12 MED ORDER — HYDROCODONE-ACETAMINOPHEN 5-325 MG PO TABS: ORAL_TABLET | ORAL | Status: DC

## 2016-03-12 MED ORDER — CEPHALEXIN 500 MG PO CAPS: 500.0000 mg | ORAL_CAPSULE | Freq: Two times a day (BID) | ORAL | Status: DC

## 2016-03-12 NOTE — Patient Instructions (Signed)
Blepharitis Blepharitis is inflammation of the eyelids. Blepharitis may happen with:  Reddish, scaly skin around the scalp and eyebrows.  Burning or itching of the eyelids.  Eye discharge at night that causes the eyelashes to stick together in the morning.  Eyelashes that fall out.  Sensitivity to light. HOME CARE INSTRUCTIONS Pay attention to any changes in how you look or feel. Follow these instructions to help with your condition: Keeping Clean  Wash your hands often.  Wash your eyelids with warm water or with warm water that is mixed with a small amount of baby shampoo. Do this two times per day or as often as needed.  Wash your face and eyebrows at least once a day.  Use a clean towel each time you dry your eyelids. Do not use this towel to clean or dry other areas of your body. Do not share your towel with anyone. General Instructions  Avoid wearing makeup until you get better. Do not share makeup with anyone.  Avoid rubbing your eyes.  Apply warm compresses to your eyes 2 times per day for 10 minutes at a time, or as told by your health care provider.  If you were prescribed an antibiotic ointment or steroid drops, apply or use the medicine as told by your health care provider. Do not stop using the medicine even if you feel better.  Keep all follow-up visits as told by your health care provider. This is important. SEEK MEDICAL CARE IF:  Your eyelids feel hot.  You have blisters or a rash on your eyelids.  The condition does not go away in 2-4 days.  The inflammation gets worse. SEEK IMMEDIATE MEDICAL CARE IF:  You have pain or redness that gets worse or spreads to other parts of your face.  Your vision changes.  You have pain when looking at lights or moving objects.  You have a fever.   This information is not intended to replace advice given to you by your health care provider. Make sure you discuss any questions you have with your health care  provider.   Document Released: 08/16/2000 Document Revised: 05/10/2015 Document Reviewed: 12/12/2014 Elsevier Interactive Patient Education 2016 Elsevier Inc[employer]  

## 2016-03-13 ENCOUNTER — Encounter: Payer: Self-pay | Admitting: Physician Assistant

## 2016-03-13 NOTE — Progress Notes (Signed)
   Subjective:    Patient ID: Tamara Greer, female    DOB: 08-08-1966, 50 y.o.   MRN: [id number]  HPI Patient is a 50 year old female who presents to the clinic with a chief complaint of painful swollen right upper eyelid. Similar symptoms occurred in left eye and she went to [facility]. She was given tobramycin ointment. Symptoms cleared in right eye and then began to occur in left upper eye. She denies any fever, chills, sinus pressure, ear pain or sore throat. She is only tried the tobramycin drops once or twice with minimal improvement.  Patient is also requesting a GI referral for left upper quadrant pain and diarrhea. She has had these symptoms for over 6 months. She feels like after almost any meal she has an episode of loose stool. At times her lower abdomen will have cramps and when she has bowel movement she will feel like she is running a fever. Resolved with bowel movement. Not had recent colonoscopy. On probiotic daily. Has ongoing GERD. On protonix and still has reflux.   She also would like to come get her three-month supply of Norco that she takes twice a day. Patient is on a pain contract.   Review of Systems  All other systems reviewed and are negative.      Objective:   Physical Exam  Constitutional: She is oriented to person, place, and time. She appears well-developed and well-nourished.  HENT:  Head: Normocephalic and atraumatic.  Right Ear: External ear normal.  Left Ear: External ear normal.  Nose: Nose normal.  Mouth/Throat: Oropharynx is clear and moist. No oropharyngeal exudate.  Eyes: Conjunctivae and EOM are normal. Pupils are equal, round, and reactive to light. Right eye exhibits no discharge. Left eye exhibits no discharge.    Neck: Normal range of motion. Neck supple.  Cardiovascular: Normal rate, regular rhythm and normal heart sounds.   Pulmonary/Chest: Effort normal and breath sounds normal.  Neurological: She is alert and oriented to  person, place, and time.  Psychiatric: She has a normal mood and affect. Her behavior is normal.          Assessment & Plan:  Blepharitis/sty- warm compresses 3-4 times a day. Continue with tobramycin topically. Added oral keflex. Follow up if not improving.   GERD/LUQ pain/Diarrhea/colon cancer screening- referral made to GI for colonscopy and endoscopy. Continue on probiotic. Continue on protonix. Consider adding zantac bid.   Cervical DDD- UDS screen. norco given for 3 months.

## 2016-03-14 ENCOUNTER — Encounter: Payer: Self-pay | Admitting: Gastroenterology

## 2016-03-16 LAB — PAIN MGMT, PROFILE 6 CONF W/O MM, U
6 Acetylmorphine: NEGATIVE ng/mL (ref <10)
ALCOHOL METABOLITES: NEGATIVE ng/mL (ref <500)
ALPHAHYDROXYALPRAZOLAM: 56 ng/mL — AB (ref <25)
ALPHAHYDROXYTRIAZOLAM: NEGATIVE ng/mL (ref <50)
AMINOCLONAZEPAM: NEGATIVE ng/mL (ref <25)
Alphahydroxymidazolam: NEGATIVE ng/mL (ref <50)
Amphetamines: NEGATIVE ng/mL (ref <500)
BENZODIAZEPINES: POSITIVE ng/mL — AB (ref <100)
Barbiturates: NEGATIVE ng/mL (ref <300)
COCAINE METABOLITE: NEGATIVE ng/mL (ref <150)
CODEINE: NEGATIVE ng/mL (ref <50)
CREATININE: 60.3 mg/dL (ref >=20.0)
Hydrocodone: 1695 ng/mL — ABNORMAL HIGH (ref <50)
Hydromorphone: NEGATIVE ng/mL (ref <50)
Hydroxyethylflurazepam: NEGATIVE ng/mL (ref <50)
Lorazepam: NEGATIVE ng/mL (ref <50)
MARIJUANA METABOLITE: NEGATIVE ng/mL (ref <20)
METHADONE METABOLITE: NEGATIVE ng/mL (ref <100)
MORPHINE: NEGATIVE ng/mL (ref <50)
Nordiazepam: NEGATIVE ng/mL (ref <50)
Norhydrocodone: 1596 ng/mL — ABNORMAL HIGH (ref <50)
OXIDANT: NEGATIVE ug/mL (ref <200)
Opiates: POSITIVE ng/mL — AB (ref <100)
Oxazepam: NEGATIVE ng/mL (ref <50)
Oxycodone: NEGATIVE ng/mL (ref <100)
PH: 6.55 (ref 4.5–9.0)
PHENCYCLIDINE: NEGATIVE ng/mL (ref <25)
PLEASE NOTE: 0
Temazepam: NEGATIVE ng/mL (ref <50)

## 2016-04-12 ENCOUNTER — Other Ambulatory Visit: Payer: Self-pay | Admitting: Physician Assistant

## 2016-04-30 ENCOUNTER — Ambulatory Visit (INDEPENDENT_AMBULATORY_CARE_PROVIDER_SITE_OTHER): Payer: BLUE CROSS/BLUE SHIELD | Admitting: Physician Assistant

## 2016-04-30 ENCOUNTER — Encounter: Payer: Self-pay | Admitting: Physician Assistant

## 2016-04-30 VITALS — BP 136/75 | HR 71 | Wt 311.0 lb

## 2016-04-30 DIAGNOSIS — R142 Eructation: Secondary | ICD-10-CM

## 2016-04-30 DIAGNOSIS — G4733 Obstructive sleep apnea (adult) (pediatric): Secondary | ICD-10-CM | POA: Diagnosis not present

## 2016-04-30 DIAGNOSIS — R059 Cough, unspecified: Secondary | ICD-10-CM

## 2016-04-30 DIAGNOSIS — R0789 Other chest pain: Secondary | ICD-10-CM | POA: Diagnosis not present

## 2016-04-30 DIAGNOSIS — R05 Cough: Secondary | ICD-10-CM | POA: Diagnosis not present

## 2016-04-30 DIAGNOSIS — R52 Pain, unspecified: Secondary | ICD-10-CM

## 2016-04-30 DIAGNOSIS — M940 Chondrocostal junction syndrome [Tietze]: Secondary | ICD-10-CM

## 2016-04-30 DIAGNOSIS — R110 Nausea: Secondary | ICD-10-CM

## 2016-04-30 MED ORDER — HYDROCHLOROTHIAZIDE 25 MG PO TABS: 25.0000 mg | ORAL_TABLET | Freq: Every day | ORAL | 1 refills | Status: DC

## 2016-04-30 MED ORDER — PREDNISONE 20 MG PO TABS: ORAL_TABLET | ORAL | 0 refills | Status: DC

## 2016-04-30 NOTE — Patient Instructions (Signed)
Making digestive health appt.  Start prednisone for cough and costochondritis.  Will get abdominal u/s.    Costochondritis Costochondritis, sometimes called Tietze syndrome, is a swelling and irritation (inflammation) of the tissue (cartilage) that connects your ribs with your breastbone (sternum). It causes pain in the chest and rib area. Costochondritis usually goes away on its own over time. It can take up to 6 weeks or longer to get better, especially if you are unable to limit your activities. CAUSES  Some cases of costochondritis have no known cause. Possible causes include:  Injury (trauma).  Exercise or activity such as lifting.  Severe coughing. SIGNS AND SYMPTOMS  Pain and tenderness in the chest and rib area.  Pain that gets worse when coughing or taking deep breaths.  Pain that gets worse with specific movements. DIAGNOSIS  Your health care provider will do a physical exam and ask about your symptoms. Chest X-rays or other tests may be done to rule out other problems. TREATMENT  Costochondritis usually goes away on its own over time. Your health care provider may prescribe medicine to help relieve pain. HOME CARE INSTRUCTIONS   Avoid exhausting physical activity. Try not to strain your ribs during normal activity. This would include any activities using chest, abdominal, and side muscles, especially if heavy weights are used.  Apply ice to the affected area for the first 2 days after the pain begins.  Put ice in a plastic bag.  Place a towel between your skin and the bag.  Leave the ice on for 20 minutes, 2-3 times a day.  Only take over-the-counter or prescription medicines as directed by your health care provider. SEEK MEDICAL CARE IF:  You have redness or swelling at the rib joints. These are signs of infection.  Your pain does not go away despite rest or medicine. SEEK IMMEDIATE MEDICAL CARE IF:   Your pain increases or you are very uncomfortable.  You  have shortness of breath or difficulty breathing.  You cough up blood.  You have worse chest pains, sweating, or vomiting.  You have a fever or persistent symptoms for more than 2-3 days.  You have a fever and your symptoms suddenly get worse. MAKE SURE YOU:   Understand these instructions.  Will watch your condition.  Will get help right away if you are not doing well or get worse.   This information is not intended to replace advice given to you by your health care provider. Make sure you discuss any questions you have with your health care provider.   Document Released: 05/29/2005 Document Revised: 06/09/2013 Document Reviewed: 03/23/2013 Elsevier Interactive Patient Education [employer].

## 2016-04-30 NOTE — Progress Notes (Signed)
   Subjective:    Patient ID: Tamara Greer, female    DOB: 1966-02-02, 50 y.o.   MRN: [id number]  HPI  Patient is a 50 year old female, morbidly obese, who presents to the clinic with one month of right-sided chest discomfort and persistent cough. Cough is dry and worse when she lays down. Denies any wheezing but she has had some shortness of breath. The right-sided chest discomfort is the most concerning to her. Yesterday she did not go to church because she felt so tired. She denies any medication changes. She does report that she has been burping multiple times a day. Sunday she just can't stop burping. She does have pain after eating. She does have a history of acid reflux. Deep breathing causes her chest discomfort to worsen.    Review of Systems    see history of present illness Objective:   Physical Exam  Constitutional: She is oriented to person, place, and time. She appears well-developed and well-nourished.  HENT:  Head: Normocephalic and atraumatic.  Cardiovascular: Normal rate, regular rhythm and normal heart sounds.   Pulmonary/Chest: Effort normal and breath sounds normal. She has no wheezes.    Abdominal: Soft. Bowel sounds are normal.  Mild tenderness over epigastric area to palpation. No guarding or rebound.   Neurological: She is alert and oriented to person, place, and time.  Psychiatric: She has a normal mood and affect. Her behavior is normal.          Assessment & Plan:  Chest discomfort/cough/costochondritis- EKG NSR, no acute changes, no ST elevation or depression. Reassured patient that EKG was great. I do not think this represents a cardiac cause. Isolyte the cough has calls costochondritis. Prednisone taper was given today. Hopefully this will help with the cough is there some reactive airway disease going on and with inflammation. Continue an anti-inflammatory daily as well. Encouraged icing. Cough could also be caused by her GI issues. See below  discussion about her gastrointestinal issues.  Cough/pain aggravated by eating/GERD/burping- At this point unsure where all her symptoms are originating from. I would like to go ahead and get another abdominal ultrasound to look at her gallbladder. Discussed with patient at times her gallbladder can she referred pain into her right shoulder and chest. She is currently on a wait list for gastroenterology. We expedited her referral and sent to digestive health so that she could get in as soon as possible. Continue to take Protonix.  Spent 30 minutes with patient counseling patient regarding treatment protocol.

## 2016-05-01 ENCOUNTER — Encounter: Payer: Self-pay | Admitting: Physician Assistant

## 2016-05-02 DIAGNOSIS — K30 Functional dyspepsia: Secondary | ICD-10-CM | POA: Diagnosis not present

## 2016-05-02 DIAGNOSIS — R110 Nausea: Secondary | ICD-10-CM | POA: Diagnosis not present

## 2016-05-02 DIAGNOSIS — R197 Diarrhea, unspecified: Secondary | ICD-10-CM | POA: Diagnosis not present

## 2016-05-07 ENCOUNTER — Ambulatory Visit (INDEPENDENT_AMBULATORY_CARE_PROVIDER_SITE_OTHER): Payer: BLUE CROSS/BLUE SHIELD

## 2016-05-07 DIAGNOSIS — R52 Pain, unspecified: Secondary | ICD-10-CM | POA: Diagnosis not present

## 2016-05-07 DIAGNOSIS — I714 Abdominal aortic aneurysm, without rupture: Secondary | ICD-10-CM | POA: Diagnosis not present

## 2016-05-07 DIAGNOSIS — R0789 Other chest pain: Secondary | ICD-10-CM | POA: Diagnosis not present

## 2016-05-07 DIAGNOSIS — R110 Nausea: Secondary | ICD-10-CM

## 2016-05-07 DIAGNOSIS — R142 Eructation: Secondary | ICD-10-CM | POA: Diagnosis not present

## 2016-05-20 ENCOUNTER — Ambulatory Visit: Payer: BLUE CROSS/BLUE SHIELD | Admitting: Gastroenterology

## 2016-05-27 ENCOUNTER — Other Ambulatory Visit: Payer: Self-pay | Admitting: Physician Assistant

## 2016-06-10 ENCOUNTER — Other Ambulatory Visit: Payer: Self-pay | Admitting: Sports Medicine

## 2016-07-08 ENCOUNTER — Other Ambulatory Visit: Payer: Self-pay | Admitting: *Deleted

## 2016-07-08 DIAGNOSIS — M5030 Other cervical disc degeneration, unspecified cervical region: Secondary | ICD-10-CM

## 2016-07-08 MED ORDER — HYDROCODONE-ACETAMINOPHEN 5-325 MG PO TABS: ORAL_TABLET | ORAL | 0 refills | Status: DC

## 2016-07-11 ENCOUNTER — Other Ambulatory Visit: Payer: Self-pay | Admitting: Physician Assistant

## 2016-07-23 ENCOUNTER — Encounter: Payer: Self-pay | Admitting: Physician Assistant

## 2016-07-23 ENCOUNTER — Ambulatory Visit (INDEPENDENT_AMBULATORY_CARE_PROVIDER_SITE_OTHER): Payer: BLUE CROSS/BLUE SHIELD | Admitting: Physician Assistant

## 2016-07-23 VITALS — BP 125/42 | HR 77 | Temp 98.4°F | Ht 60.0 in | Wt 319.0 lb

## 2016-07-23 DIAGNOSIS — M5030 Other cervical disc degeneration, unspecified cervical region: Secondary | ICD-10-CM

## 2016-07-23 DIAGNOSIS — K2100 Gastro-esophageal reflux disease with esophagitis, without bleeding: Secondary | ICD-10-CM

## 2016-07-23 DIAGNOSIS — J0100 Acute maxillary sinusitis, unspecified: Secondary | ICD-10-CM

## 2016-07-23 DIAGNOSIS — M170 Bilateral primary osteoarthritis of knee: Secondary | ICD-10-CM

## 2016-07-23 DIAGNOSIS — I10 Essential (primary) hypertension: Secondary | ICD-10-CM

## 2016-07-23 MED ORDER — METOPROLOL SUCCINATE ER 25 MG PO TB24: 25.0000 mg | ORAL_TABLET | Freq: Every day | ORAL | 1 refills | Status: DC

## 2016-07-23 MED ORDER — AMOXICILLIN-POT CLAVULANATE 875-125 MG PO TABS: 1.0000 | ORAL_TABLET | Freq: Two times a day (BID) | ORAL | 0 refills | Status: DC

## 2016-07-23 MED ORDER — PANTOPRAZOLE SODIUM 40 MG PO TBEC: 40.0000 mg | DELAYED_RELEASE_TABLET | Freq: Every day | ORAL | 1 refills | Status: DC

## 2016-07-23 MED ORDER — HYDROCODONE-ACETAMINOPHEN 5-325 MG PO TABS: ORAL_TABLET | ORAL | 0 refills | Status: DC

## 2016-07-23 MED ORDER — OLMESARTAN MEDOXOMIL-HCTZ 40-12.5 MG PO TABS: 1.0000 | ORAL_TABLET | Freq: Every day | ORAL | 1 refills | Status: DC

## 2016-07-23 MED ORDER — ALPRAZOLAM 0.5 MG PO TABS: 0.5000 mg | ORAL_TABLET | Freq: Every day | ORAL | 5 refills | Status: DC

## 2016-07-23 MED ORDER — HYDROCHLOROTHIAZIDE 25 MG PO TABS: 25.0000 mg | ORAL_TABLET | Freq: Every day | ORAL | 1 refills | Status: DC

## 2016-07-23 NOTE — Patient Instructions (Signed)
Tylenol cold and sinus severe.

## 2016-07-24 ENCOUNTER — Telehealth: Payer: Self-pay | Admitting: Physician Assistant

## 2016-07-24 ENCOUNTER — Encounter: Payer: Self-pay | Admitting: Physician Assistant

## 2016-07-24 DIAGNOSIS — M170 Bilateral primary osteoarthritis of knee: Secondary | ICD-10-CM

## 2016-07-24 DIAGNOSIS — M5030 Other cervical disc degeneration, unspecified cervical region: Secondary | ICD-10-CM

## 2016-07-24 MED ORDER — DICLOFENAC SODIUM 2 % TD SOLN: TRANSDERMAL | 3 refills | Status: DC

## 2016-07-24 NOTE — Telephone Encounter (Signed)
Rx corrected and sent.

## 2016-07-24 NOTE — Telephone Encounter (Signed)
2

## 2016-07-24 NOTE — Progress Notes (Signed)
   Subjective:    Patient ID: Tamara Greer, female    DOB: [birth year], 50 y.o.   MRN: [id number]  HPI Pt is a 50 yo morbidly obese female who presents to the clinic for 3 month follow up on norco. She continues to take twice a day for knee pain. She cannot take NSAID due to severe uncontrolled acid reflux.   She also complains of ear pain, ST, dry cough, sinus pressure that started 4 days ago. She has taken OTC mucinex with little relief. No fever, chills or muscle aches.    Review of Systems  All other systems reviewed and are negative.      Objective:   Physical Exam  Constitutional: She is oriented to person, place, and time. She appears well-developed and well-nourished.  Morbidly obese.  HENT:  Head: Normocephalic and atraumatic.  Right Ear: External ear normal.  Left Ear: External ear normal.  TM's clear bilaterally.  Oropharynx erythematous without tonsilar swelling or exudate.  Nasal turbinates red and swollen.  Tenderness over maxillary sinuses to palpation.   Eyes: Conjunctivae are normal. Right eye exhibits no discharge. Left eye exhibits no discharge.  Neck: Normal range of motion. Neck supple.  Cardiovascular: Normal rate, regular rhythm and normal heart sounds.   Pulmonary/Chest: Effort normal and breath sounds normal.  Lymphadenopathy:    She has no cervical adenopathy.  Neurological: She is alert and oriented to person, place, and time.  Psychiatric: She has a normal mood and affect. Her behavior is normal.          Assessment & Plan:  [name] KitchenMarland KitchenJanellys was seen today for cough, headache and sore throat.  Diagnoses and all orders for this visit:  Primary osteoarthritis of both knees -     Diclofenac Sodium (PENNSAID) 2 % SOLN; Apply to affect area twice daily as needed.  DDD (degenerative disc disease), cervical -     HYDROcodone-acetaminophen (NORCO/VICODIN) 5-325 MG tablet; Take 1 tablet every 12 hours as needed for severe pain. -     Diclofenac Sodium  (PENNSAID) 2 % SOLN; Apply to affect area twice daily as needed.  Morbid obesity (HCC)  Gastroesophageal reflux disease with esophagitis -     pantoprazole (PROTONIX) 40 MG tablet; Take 1 tablet (40 mg total) by mouth daily.  Acute maxillary sinusitis, recurrence not specified -     amoxicillin-clavulanate (AUGMENTIN) 875-125 MG tablet; Take 1 tablet by mouth 2 (two) times daily.  Essential hypertension, benign -     hydrochlorothiazide (HYDRODIURIL) 25 MG tablet; Take 1 tablet (25 mg total) by mouth daily. -     metoprolol succinate (TOPROL-XL) 25 MG 24 hr tablet; Take 1 tablet (25 mg total) by mouth daily. -     olmesartan-hydrochlorothiazide (BENICAR HCT) 40-12.5 MG tablet; Take 1 tablet by mouth daily.  Other orders -     ALPRAZolam (XANAX) 0.5 MG tablet; Take 1 tablet (0.5 mg total) by mouth at bedtime.   Will send pennsaid cream to use topically as well to see if helps with pain.   I do not think needs abx yet but was given if symptoms did not improve in next 36 hours. It is a holiday weekend.  Discussed tylenol cold/sinus/severe. Stay hydrated and rest.

## 2016-07-24 NOTE — Telephone Encounter (Signed)
Pharmacy called, they need to know how many pumps Pt is to apply twice daily. Will route.

## 2016-08-01 DIAGNOSIS — G4733 Obstructive sleep apnea (adult) (pediatric): Secondary | ICD-10-CM | POA: Diagnosis not present

## 2016-09-09 ENCOUNTER — Encounter: Payer: Self-pay | Admitting: Physician Assistant

## 2016-09-09 ENCOUNTER — Ambulatory Visit (INDEPENDENT_AMBULATORY_CARE_PROVIDER_SITE_OTHER): Payer: BLUE CROSS/BLUE SHIELD | Admitting: Physician Assistant

## 2016-09-09 VITALS — BP 113/71 | HR 84 | Ht 60.0 in

## 2016-09-09 DIAGNOSIS — K5900 Constipation, unspecified: Secondary | ICD-10-CM

## 2016-09-09 DIAGNOSIS — S29011A Strain of muscle and tendon of front wall of thorax, initial encounter: Secondary | ICD-10-CM | POA: Diagnosis not present

## 2016-09-09 DIAGNOSIS — R300 Dysuria: Secondary | ICD-10-CM | POA: Diagnosis not present

## 2016-09-09 DIAGNOSIS — R8299 Other abnormal findings in urine: Secondary | ICD-10-CM | POA: Diagnosis not present

## 2016-09-09 DIAGNOSIS — R82998 Other abnormal findings in urine: Secondary | ICD-10-CM

## 2016-09-09 LAB — POCT URINALYSIS DIPSTICK
BILIRUBIN UA: NEGATIVE
Glucose, UA: NEGATIVE
KETONES UA: NEGATIVE
Nitrite, UA: NEGATIVE
PH UA: 7.5
PROTEIN UA: NEGATIVE
RBC UA: NEGATIVE
SPEC GRAV UA: 1.02
Urobilinogen, UA: 0.2

## 2016-09-09 MED ORDER — CIPROFLOXACIN HCL 500 MG PO TABS: 500.0000 mg | ORAL_TABLET | Freq: Two times a day (BID) | ORAL | 0 refills | Status: DC

## 2016-09-09 MED ORDER — LINACLOTIDE 290 MCG PO CAPS: 290.0000 ug | ORAL_CAPSULE | Freq: Every day | ORAL | 5 refills | Status: DC

## 2016-09-09 MED ORDER — CYCLOBENZAPRINE HCL 10 MG PO TABS: 10.0000 mg | ORAL_TABLET | Freq: Three times a day (TID) | ORAL | 0 refills | Status: DC | PRN

## 2016-09-09 NOTE — Progress Notes (Signed)
Subjective:    Patient ID: Tamara Greer, female    DOB: [birth year], 51 y.o.   MRN: [id number]  HPI  Pt is a 51 yo morbidly obese female who presents to the clinic with dysuria, right sided chest pain.   She has had dysuria for 2 weeks. No fever. She has had some bilateral flank pain. Some nausea but no vomiting. She increased her fluids with no relief.   She is also having some right sided chest pain with movement. She denies any new activies, injuries, or heavy lifting. Rates pain 5/10. She could not use diclofenac due to skin rash after use. Right chest is tender to touch. She has not tried anything else to help with pain. She admits to being SOB with exertion.   Pt also mentions how bad her ongoing constipation is. She is also on norco daily. She is using miralax, metamucil, probiotics with no relief.   Review of Systems    see HPI.  Objective:   Physical Exam  Constitutional: She is oriented to person, place, and time. She appears well-developed and well-nourished.  Morbidly obese.   HENT:  Head: Normocephalic and atraumatic.  Pulmonary/Chest: Effort normal and breath sounds normal.    NO CVA tenderness.  ROM of right shoulder full.   Abdominal: Soft. Bowel sounds are normal. She exhibits no distension. There is tenderness. There is no rebound and no guarding.  Mild diffuse tenderness to palpation over suprapubic area.   Neurological: She is alert and oriented to person, place, and time.  Psychiatric: She has a normal mood and affect. Her behavior is normal.          Assessment & Plan:  [name] KitchenMarland KitchenDiagnoses and all orders for this visit:  Pectoralis muscle strain, initial encounter -     cyclobenzaprine (FLEXERIL) 10 MG tablet; Take 1 tablet (10 mg total) by mouth 3 (three) times daily as needed for muscle spasms.  Dysuria -     POCT urinalysis dipstick -     Urine Culture -     ciprofloxacin (CIPRO) 500 MG tablet; Take 1 tablet (500 mg total) by mouth 2 (two) times  daily. For 7 days.  Leukocytes in urine -     Urine Culture -     ciprofloxacin (CIPRO) 500 MG tablet; Take 1 tablet (500 mg total) by mouth 2 (two) times daily. For 7 days.  Constipation, unspecified constipation type -     linaclotide (LINZESS) 290 MCG CAPS capsule; Take 1 capsule (290 mcg total) by mouth daily before breakfast.  I feel like right chest pain is strain. Discussed ice, rest, tylenol. Flexeril given up to 3 times a day. Discussed sedation potential. Follow up as needed.  .. Results for orders placed or performed in visit on 09/09/16  Urine Culture  Result Value Ref Range   Colony Count Greater than 100,000 CFU/mL    Preliminary Report GRAM NEGATIVE BACILLI ISOLATED   POCT urinalysis dipstick  Result Value Ref Range   Color, UA yellow    Clarity, UA slightly cloudy    Glucose, UA neg    Bilirubin, UA neg    Ketones, UA neg    Spec Grav, UA 1.020    Blood, UA neg    pH, UA 7.5    Protein, UA neg    Urobilinogen, UA 0.2    Nitrite, UA neg    Leukocytes, UA small (1+) (A) Negative   Treated with cipro. Will call with culture results.  Discussed symptomatic  care.   miralax failed. On opioid. Will try linzess. Given coupon card.

## 2016-09-09 NOTE — Progress Notes (Signed)
Will culture.

## 2016-09-12 LAB — URINE CULTURE

## 2016-10-16 DIAGNOSIS — G4733 Obstructive sleep apnea (adult) (pediatric): Secondary | ICD-10-CM | POA: Diagnosis not present

## 2016-11-14 DIAGNOSIS — G4733 Obstructive sleep apnea (adult) (pediatric): Secondary | ICD-10-CM | POA: Diagnosis not present

## 2016-11-18 ENCOUNTER — Ambulatory Visit: Payer: BLUE CROSS/BLUE SHIELD | Admitting: Physician Assistant

## 2016-11-20 ENCOUNTER — Ambulatory Visit: Payer: BLUE CROSS/BLUE SHIELD | Admitting: Physician Assistant

## 2016-11-26 ENCOUNTER — Encounter: Payer: Self-pay | Admitting: Physician Assistant

## 2016-11-26 ENCOUNTER — Ambulatory Visit (INDEPENDENT_AMBULATORY_CARE_PROVIDER_SITE_OTHER): Payer: BLUE CROSS/BLUE SHIELD | Admitting: Physician Assistant

## 2016-11-26 VITALS — BP 108/61 | HR 75 | Ht 60.0 in | Wt 322.0 lb

## 2016-11-26 DIAGNOSIS — I714 Abdominal aortic aneurysm, without rupture, unspecified: Secondary | ICD-10-CM

## 2016-11-26 DIAGNOSIS — R0789 Other chest pain: Secondary | ICD-10-CM | POA: Diagnosis not present

## 2016-11-26 DIAGNOSIS — R5383 Other fatigue: Secondary | ICD-10-CM | POA: Diagnosis not present

## 2016-11-26 DIAGNOSIS — R7303 Prediabetes: Secondary | ICD-10-CM | POA: Diagnosis not present

## 2016-11-26 DIAGNOSIS — I10 Essential (primary) hypertension: Secondary | ICD-10-CM | POA: Diagnosis not present

## 2016-11-26 DIAGNOSIS — R29898 Other symptoms and signs involving the musculoskeletal system: Secondary | ICD-10-CM | POA: Diagnosis not present

## 2016-11-26 DIAGNOSIS — E7800 Pure hypercholesterolemia, unspecified: Secondary | ICD-10-CM | POA: Diagnosis not present

## 2016-11-26 NOTE — Progress Notes (Signed)
   Subjective:    Patient ID: Tamara Greer, female    DOB: 07-Dec-1965, 51 y.o.   MRN: [id number]  HPI  Pt is a 51 yo morbily obese female with PMH of Abdominal Aortic Anersym and OSA. who presents to the clinic with some atpical chest pains, left arm heaviness, SOB, and no energy. At times her chin will ache at night. She also has some grabbing in the LUQ just under left breast when walking. She is using her CPAP most nights. She just wants to make sure her heart is ok.she wants to make sure there is not a reason for no energy.     Review of Systems  All other systems reviewed and are negative.      Objective:   Physical Exam  Constitutional: She appears well-developed and well-nourished.  Morbidly obese.   HENT:  Head: Normocephalic and atraumatic.  Cardiovascular: Normal rate, regular rhythm and normal heart sounds.   Pulmonary/Chest: Effort normal and breath sounds normal.  Abdominal: Soft. Bowel sounds are normal. She exhibits no distension. There is no tenderness. There is no rebound and no guarding.          Assessment & Plan:  [name] KitchenMarland KitchenMel was seen today for left upper quadrant pain, constipation and shortness of breath.  Diagnoses and all orders for this visit:  Heaviness of upper extremity -     Ambulatory referral to Cardiology  Abdominal aortic aneurysm (AAA) without rupture ([place]) -     Ambulatory referral to Cardiology  Essential hypertension, benign -     Comprehensive metabolic panel -     Ambulatory referral to Cardiology  No energy -     CBC -     Comprehensive metabolic panel -     C-reactive protein -     Ferritin -     Sedimentation rate -     TSH -     Vitamin B12 -     Vit D  25 hydroxy (rtn osteoporosis monitoring)  Pure hypercholesterolemia -     Lipid panel -     Ambulatory referral to Cardiology  Pre-diabetes -     Comprehensive metabolic panel -     Hemoglobin A1c -     Ambulatory referral to Cardiology  Atypical chest pain -      Ambulatory referral to Cardiology  Other orders -     ergocalciferol (VITAMIN D2) 50000 units capsule; Take 1 capsule (50,000 Units total) by mouth once a week.   Vitals look good.  Discussed with patient ordering a stress test. She would not be able to do exercise but could order nuclear medicine.  Will make referral to cardiology for input.  EKG done 04/2016.  Last U/S was done 05/2016. Stable AAA.  Continue ASA 81mg .  Will recheck lipid panel/glucose/a1c.  I suspect some of her symptoms could be due to her weight and body habitus;however support full cardio work up.

## 2016-11-27 LAB — COMPREHENSIVE METABOLIC PANEL
ALBUMIN: 4.1 g/dL (ref 3.6–5.1)
ALK PHOS: 96 U/L (ref 33–130)
ALT: 48 U/L — AB (ref 6–29)
AST: 49 U/L — ABNORMAL HIGH (ref 10–35)
BILIRUBIN TOTAL: 0.7 mg/dL (ref 0.2–1.2)
BUN: 21 mg/dL (ref 7–25)
CHLORIDE: 100 mmol/L (ref 98–110)
CO2: 22 mmol/L (ref 20–31)
CREATININE: 0.64 mg/dL (ref 0.50–1.05)
Calcium: 9.3 mg/dL (ref 8.6–10.4)
Glucose, Bld: 166 mg/dL — ABNORMAL HIGH (ref 65–99)
Potassium: 4.6 mmol/L (ref 3.5–5.3)
SODIUM: 135 mmol/L (ref 135–146)
TOTAL PROTEIN: 7 g/dL (ref 6.1–8.1)

## 2016-11-27 LAB — CBC
HCT: 39.6 % (ref 35.0–45.0)
HEMOGLOBIN: 12.9 g/dL (ref 11.7–15.5)
MCH: 30.1 pg (ref 27.0–33.0)
MCHC: 32.6 g/dL (ref 32.0–36.0)
MCV: 92.3 fL (ref 80.0–100.0)
MPV: 12.8 fL — ABNORMAL HIGH (ref 7.5–12.5)
Platelets: 249 10*3/uL (ref 140–400)
RBC: 4.29 MIL/uL (ref 3.80–5.10)
RDW: 13.9 % (ref 11.0–15.0)
WBC: 7.2 10*3/uL (ref 3.8–10.8)

## 2016-11-27 LAB — LIPID PANEL
CHOLESTEROL: 163 mg/dL (ref <200)
HDL: 51 mg/dL (ref >50)
LDL CALC: 87 mg/dL (ref <100)
Total CHOL/HDL Ratio: 3.2 Ratio (ref <5.0)
Triglycerides: 125 mg/dL (ref <150)
VLDL: 25 mg/dL (ref <30)

## 2016-11-27 LAB — VITAMIN B12: Vitamin B-12: 338 pg/mL (ref 200–1100)

## 2016-11-27 LAB — SEDIMENTATION RATE: Sed Rate: 25 mm/hr (ref 0–30)

## 2016-11-27 LAB — HEMOGLOBIN A1C
Hgb A1c MFr Bld: 6.6 % — ABNORMAL HIGH (ref <5.7)
MEAN PLASMA GLUCOSE: 143 mg/dL

## 2016-11-27 LAB — TSH: TSH: 3.18 m[IU]/L

## 2016-11-27 LAB — C-REACTIVE PROTEIN: CRP: 4.9 mg/L (ref <8.0)

## 2016-11-27 LAB — VITAMIN D 25 HYDROXY (VIT D DEFICIENCY, FRACTURES): Vit D, 25-Hydroxy: 13 ng/mL — ABNORMAL LOW (ref 30–100)

## 2016-11-27 LAB — FERRITIN: FERRITIN: 120 ng/mL (ref 10–232)

## 2016-11-27 MED ORDER — ERGOCALCIFEROL 1.25 MG (50000 UT) PO CAPS: 50000.0000 [IU] | ORAL_CAPSULE | ORAL | 0 refills | Status: DC

## 2016-11-28 DIAGNOSIS — R29898 Other symptoms and signs involving the musculoskeletal system: Secondary | ICD-10-CM | POA: Insufficient documentation

## 2016-11-28 HISTORY — DX: Other symptoms and signs involving the musculoskeletal system: R29.898

## 2016-12-21 ENCOUNTER — Other Ambulatory Visit: Payer: Self-pay | Admitting: Physician Assistant

## 2017-01-06 NOTE — Progress Notes (Deleted)
Referring-Breeback, Jade L, PA-C Reason for referral-Chest pain  HPI: 51 yo female for evaluation of chest pain, HTN, AAA at request of Iran Planas, PA-C. Abd ultrasound 10/20/14 showed 3.6 cm AAA; fu studies did not show aneurysm but aorta not well visualized.   Current Outpatient Prescriptions  Medication Sig Dispense Refill  . ALPRAZolam (XANAX) 0.5 MG tablet Take 1 tablet (0.5 mg total) by mouth at bedtime. 30 tablet 5  . aspirin EC 81 MG tablet Take 81 mg by mouth daily.    [name] Kitchen atorvastatin (LIPITOR) 40 MG tablet TAKE ONE TABLET BY MOUTH ONCE DAILY 30 tablet 11  . cyclobenzaprine (FLEXERIL) 10 MG tablet Take 1 tablet (10 mg total) by mouth 3 (three) times daily as needed for muscle spasms. 30 tablet 0  . Diclofenac Sodium (PENNSAID) 2 % SOLN Apply 2 pumps to affected area twice daily as needed. 2 Bottle 3  . ergocalciferol (VITAMIN D2) 50000 units capsule Take 1 capsule (50,000 Units total) by mouth once a week. 12 capsule 0  . hydrochlorothiazide (HYDRODIURIL) 25 MG tablet Take 1 tablet (25 mg total) by mouth daily. 90 tablet 1  . HYDROcodone-acetaminophen (NORCO/VICODIN) 5-325 MG tablet Take 1 tablet every 12 hours as needed for severe pain. 60 tablet 0  . linaclotide (LINZESS) 290 MCG CAPS capsule Take 1 capsule (290 mcg total) by mouth daily before breakfast. 30 capsule 5  . metoprolol succinate (TOPROL-XL) 25 MG 24 hr tablet Take 1 tablet (25 mg total) by mouth daily. 90 tablet 1  . olmesartan-hydrochlorothiazide (BENICAR HCT) 40-12.5 MG tablet Take 1 tablet by mouth daily. 90 tablet 1  . pantoprazole (PROTONIX) 40 MG tablet Take 1 tablet (40 mg total) by mouth daily. 90 tablet 1  . traZODone (DESYREL) 50 MG tablet Take 0.5-1 tablets (25-50 mg total) by mouth at bedtime as needed for sleep. 30 tablet 3   No current facility-administered medications for this visit.     Allergies  Allergen Reactions  . Dexilant [Dexlansoprazole]     Whole body aches/pain.   . Diclofenac    Topical cream gave RASH.   [name] Kitchen Metformin And Related     GI side effects    Past Medical History:  Diagnosis Date  . Cancer [facility])     Past Surgical History:  Procedure Laterality Date  . ABDOMINAL HYSTERECTOMY    . ORTHOPEDIC SURGERY     steel rod placed in tibia    Social History   Social History  . Marital status: Married    Spouse name: N/A  . Number of children: N/A  . Years of education: N/A   Occupational History  . Not on file.   Social History Main Topics  . Smoking status: Never Smoker  . Smokeless tobacco: Never Used  . Alcohol use No  . Drug use: No  . Sexual activity: Yes   Other Topics Concern  . Not on file   Social History Narrative  . No narrative on file    Family History  Problem Relation Age of Onset  . Diabetes Mother     ROS: no fevers or chills, productive cough, hemoptysis, dysphasia, odynophagia, melena, hematochezia, dysuria, hematuria, rash, seizure activity, orthopnea, PND, pedal edema, claudication. Remaining systems are negative.  Physical Exam:   There were no vitals taken for this visit.  General:  Well developed/well nourished in NAD Skin warm/dry Patient not depressed No peripheral clubbing Back-normal HEENT-normal/normal eyelids Neck supple/normal carotid upstroke bilaterally; no bruits; no JVD; no thyromegaly  chest - CTA/ normal expansion CV - RRR/normal S1 and S2; no murmurs, rubs or gallops;  PMI nondisplaced Abdomen -NT/ND, no HSM, no mass, + bowel sounds, no bruit 2+ femoral pulses, no bruits Ext-no edema, chords, 2+ DP Neuro-grossly nonfocal  ECG - personally reviewed  A/P  1  Kirk Ruths, MD

## 2017-01-08 ENCOUNTER — Ambulatory Visit: Payer: BLUE CROSS/BLUE SHIELD | Admitting: Cardiology

## 2017-01-16 ENCOUNTER — Encounter: Payer: Self-pay | Admitting: Sports Medicine

## 2017-01-16 ENCOUNTER — Ambulatory Visit (INDEPENDENT_AMBULATORY_CARE_PROVIDER_SITE_OTHER): Payer: BLUE CROSS/BLUE SHIELD | Admitting: Sports Medicine

## 2017-01-16 DIAGNOSIS — N611 Abscess of the breast and nipple: Secondary | ICD-10-CM | POA: Diagnosis not present

## 2017-01-16 HISTORY — DX: Abscess of the breast and nipple: N61.1

## 2017-01-16 MED ORDER — DOXYCYCLINE HYCLATE 100 MG PO TABS: 100.0000 mg | ORAL_TABLET | Freq: Two times a day (BID) | ORAL | 0 refills | Status: AC

## 2017-01-16 MED ORDER — HYDROCODONE-ACETAMINOPHEN 5-325 MG PO TABS: 1.0000 | ORAL_TABLET | Freq: Three times a day (TID) | ORAL | 0 refills | Status: DC | PRN

## 2017-01-16 NOTE — Progress Notes (Signed)
   Subjective:    I'm seeing this patient as a consultation for:  Iran Planas, PA-C  CC: Skin abscess  HPI: Over the past 3 days this pleasant 51 year old female has had worsening pain and swelling over her right breast, localized without radiation, more recently started to drain purulent substance. Pain is severe, persistent. No fevers, chills, night sweats.  Past medical history:  Negative.  See flowsheet/record as well for more information.  Surgical history: Negative.  See flowsheet/record as well for more information.  Family history: Negative.  See flowsheet/record as well for more information.  Social history: Negative.  See flowsheet/record as well for more information.  Allergies, and medications have been entered into the medical record, reviewed, and no changes needed.   Review of Systems: No headache, visual changes, nausea, vomiting, diarrhea, constipation, dizziness, abdominal pain, skin rash, fevers, chills, night sweats, weight loss, swollen lymph nodes, body aches, joint swelling, muscle aches, chest pain, shortness of breath, mood changes, visual or auditory hallucinations.   Objective:   General: Well Developed, well nourished, and in no acute distress.  Neuro/Psych: Alert and oriented x3, extra-ocular muscles intact, able to move all 4 extremities, sensation grossly intact. Skin: Warm and dry, no rashes noted. There is a large skin abscess over the right breast. Respiratory: Not using accessory muscles, speaking in full sentences, trachea midline.  Cardiovascular: Pulses palpable, no extremity edema. Abdomen: Does not appear distended.  Complicated Incision and drainage of abscess. Risks, benefits, and alternatives explained and consent obtained. Time out conducted. Surface cleaned with alcohol. 20 mL bupivacaine with epinephine infiltrated around abscess. Adequate anesthesia ensured. Area prepped and draped in a sterile fashion. #11 blade used to make a stab  incision into abscess. Pus expressed with pressure. Curved hemostat used to explore 4 quadrants and loculations broken up. Further purulence expressed. Iodoform packing placed leaving a 1-inch tail, approximately 3 feet was used. Hemostasis achieved. Pt stable. Aftercare and follow-up advised.  Impression and Recommendations:   This case required medical decision making of moderate complexity.  Abscess of breast, right Incision and drainage with packing. Doxycycline. Wound culture obtained. She will remove 2 inches of packing every day. Hydrocodone for postprocedural pain.  Return in one week.

## 2017-01-16 NOTE — Patient Instructions (Signed)
Incision and Drainage Incision and drainage is a surgical procedure to open and drain a fluid-filled sac. The sac may be filled with pus, mucus, or blood. Examples of fluid-filled sacs that may need surgical drainage include cysts, skin infections (abscesses), and red lumps that develop from a ruptured cyst or a small abscess (boils). You may need this procedure if the affected area is large, painful, infected, or not healing well. Tell a health care provider about:  Any allergies you have.  All medicines you are taking, including vitamins, herbs, eye drops, creams, and over-the-counter medicines.  Any problems you or family members have had with anesthetic medicines.  Any blood disorders you have.  Any surgeries you have had.  Any medical conditions you have.  Whether you are pregnant or may be pregnant. What are the risks? Generally, this is a safe procedure. However, problems may occur, including:  Infection.  Bleeding.  Allergic reactions to medicines.  Scarring.  What happens before the procedure?  You may need an ultrasound or other imaging tests to see how large or deep the fluid-filled sac is.  You may have blood tests to check for infection.  You may get a tetanus shot.  You may be given antibiotic medicine to help prevent infection.  Follow instructions from your health care provider about eating or drinking restrictions.  Ask your health care provider about: ? Changing or stopping your regular medicines. This is especially important if you are taking diabetes medicines or blood thinners. ? Taking medicines such as aspirin and ibuprofen. These medicines can thin your blood. Do not take these medicines before your procedure if your health care provider instructs you not to.  Plan to have someone take you home after the procedure.  If you will be going home right after the procedure, plan to have someone stay with you for 24 hours. What happens during the  procedure?  To reduce your risk of infection: ? Your health care team will wash or sanitize their hands. ? Your skin will be washed with soap.  You will be given one or more of the following: ? A medicine to help you relax (sedative). ? A medicine to numb the area (local anesthetic). ? A medicine to make you fall asleep (general anesthetic).  An incision will be made in the top of the fluid-filled sac.  The contents of the sac may be squeezed out, or a syringe or tube (catheter)may be used to empty the sac.  The catheter may be left in place for several weeks to drain any fluid. Or, your health care provider may stitch open the edges of the incision to make a long-term opening for drainage (marsupialization).  The inside of the sac may be washed out (irrigated) with a sterile solution and packed with gauze before it is covered with a bandage (dressing). The procedure may vary among health care providers and hospitals. What happens after the procedure?  Your blood pressure, heart rate, breathing rate, and blood oxygen level will be monitored often until the medicines you were given have worn off.  Do not drive for 24 hours if you received a sedative. This information is not intended to replace advice given to you by your health care provider. Make sure you discuss any questions you have with your health care provider. Document Released: 02/12/2001 Document Revised: 01/25/2016 Document Reviewed: 06/09/2015 Elsevier Interactive Patient Education  2017 [employer]  

## 2017-01-16 NOTE — Assessment & Plan Note (Addendum)
Incision and drainage with packing. Doxycycline. Wound culture obtained. She will remove 2 inches of packing every day. Hydrocodone for postprocedural pain.  Return in one week.

## 2017-01-18 DIAGNOSIS — I714 Abdominal aortic aneurysm, without rupture: Secondary | ICD-10-CM | POA: Diagnosis not present

## 2017-01-18 DIAGNOSIS — N611 Abscess of the breast and nipple: Secondary | ICD-10-CM | POA: Diagnosis not present

## 2017-01-18 DIAGNOSIS — E785 Hyperlipidemia, unspecified: Secondary | ICD-10-CM | POA: Diagnosis not present

## 2017-01-18 DIAGNOSIS — F419 Anxiety disorder, unspecified: Secondary | ICD-10-CM | POA: Diagnosis not present

## 2017-01-18 DIAGNOSIS — Z7982 Long term (current) use of aspirin: Secondary | ICD-10-CM | POA: Diagnosis not present

## 2017-01-18 DIAGNOSIS — N6489 Other specified disorders of breast: Secondary | ICD-10-CM | POA: Diagnosis not present

## 2017-01-18 DIAGNOSIS — E119 Type 2 diabetes mellitus without complications: Secondary | ICD-10-CM | POA: Diagnosis not present

## 2017-01-18 DIAGNOSIS — N61 Mastitis without abscess: Secondary | ICD-10-CM | POA: Diagnosis not present

## 2017-01-18 DIAGNOSIS — N644 Mastodynia: Secondary | ICD-10-CM | POA: Diagnosis not present

## 2017-01-18 DIAGNOSIS — Z6841 Body Mass Index (BMI) 40.0 and over, adult: Secondary | ICD-10-CM | POA: Diagnosis not present

## 2017-01-18 DIAGNOSIS — L0292 Furuncle, unspecified: Secondary | ICD-10-CM | POA: Diagnosis not present

## 2017-01-18 DIAGNOSIS — I10 Essential (primary) hypertension: Secondary | ICD-10-CM | POA: Diagnosis not present

## 2017-01-19 DIAGNOSIS — Z6841 Body Mass Index (BMI) 40.0 and over, adult: Secondary | ICD-10-CM | POA: Diagnosis not present

## 2017-01-19 DIAGNOSIS — N61 Mastitis without abscess: Secondary | ICD-10-CM | POA: Diagnosis not present

## 2017-01-19 DIAGNOSIS — N611 Abscess of the breast and nipple: Secondary | ICD-10-CM | POA: Diagnosis not present

## 2017-01-19 DIAGNOSIS — E119 Type 2 diabetes mellitus without complications: Secondary | ICD-10-CM | POA: Diagnosis not present

## 2017-01-19 DIAGNOSIS — L0292 Furuncle, unspecified: Secondary | ICD-10-CM | POA: Diagnosis not present

## 2017-01-19 LAB — WOUND CULTURE
Gram Stain: NONE SEEN
Gram Stain: NONE SEEN

## 2017-01-20 DIAGNOSIS — N61 Mastitis without abscess: Secondary | ICD-10-CM | POA: Diagnosis not present

## 2017-01-20 DIAGNOSIS — Z6841 Body Mass Index (BMI) 40.0 and over, adult: Secondary | ICD-10-CM | POA: Diagnosis not present

## 2017-01-20 DIAGNOSIS — N6489 Other specified disorders of breast: Secondary | ICD-10-CM | POA: Diagnosis not present

## 2017-01-21 DIAGNOSIS — N61 Mastitis without abscess: Secondary | ICD-10-CM | POA: Diagnosis not present

## 2017-01-21 DIAGNOSIS — Z6841 Body Mass Index (BMI) 40.0 and over, adult: Secondary | ICD-10-CM | POA: Diagnosis not present

## 2017-01-22 DIAGNOSIS — N644 Mastodynia: Secondary | ICD-10-CM | POA: Diagnosis not present

## 2017-01-22 DIAGNOSIS — N61 Mastitis without abscess: Secondary | ICD-10-CM | POA: Diagnosis not present

## 2017-01-22 DIAGNOSIS — Z6841 Body Mass Index (BMI) 40.0 and over, adult: Secondary | ICD-10-CM | POA: Diagnosis not present

## 2017-01-23 ENCOUNTER — Ambulatory Visit: Payer: BLUE CROSS/BLUE SHIELD | Admitting: Sports Medicine

## 2017-01-23 DIAGNOSIS — N61 Mastitis without abscess: Secondary | ICD-10-CM | POA: Diagnosis not present

## 2017-01-23 DIAGNOSIS — Z6841 Body Mass Index (BMI) 40.0 and over, adult: Secondary | ICD-10-CM | POA: Diagnosis not present

## 2017-01-24 DIAGNOSIS — N61 Mastitis without abscess: Secondary | ICD-10-CM | POA: Diagnosis not present

## 2017-01-24 DIAGNOSIS — Z6841 Body Mass Index (BMI) 40.0 and over, adult: Secondary | ICD-10-CM | POA: Diagnosis not present

## 2017-01-28 ENCOUNTER — Encounter: Payer: Self-pay | Admitting: Physician Assistant

## 2017-01-28 ENCOUNTER — Ambulatory Visit (INDEPENDENT_AMBULATORY_CARE_PROVIDER_SITE_OTHER): Payer: BLUE CROSS/BLUE SHIELD | Admitting: Physician Assistant

## 2017-01-28 VITALS — BP 126/62 | HR 62

## 2017-01-28 DIAGNOSIS — N611 Abscess of the breast and nipple: Secondary | ICD-10-CM | POA: Insufficient documentation

## 2017-01-28 DIAGNOSIS — N61 Mastitis without abscess: Secondary | ICD-10-CM

## 2017-01-28 DIAGNOSIS — A4902 Methicillin resistant Staphylococcus aureus infection, unspecified site: Secondary | ICD-10-CM

## 2017-01-28 HISTORY — DX: Methicillin resistant Staphylococcus aureus infection, unspecified site: A49.02

## 2017-01-28 HISTORY — DX: Abscess of the breast and nipple: N61.1

## 2017-01-28 HISTORY — DX: Mastitis without abscess: N61.0

## 2017-01-28 MED ORDER — MUPIROCIN CALCIUM 2 % NA OINT: 1.0000 "application " | TOPICAL_OINTMENT | Freq: Two times a day (BID) | NASAL | 1 refills | Status: DC

## 2017-01-28 NOTE — Progress Notes (Signed)
Subjective:    Patient ID: Tamara Greer, female    DOB: 01-Mar-1966, 51 y.o.   MRN: [id number]  HPI  Pt is a 51 yo female who presents to the clinic for [facility] hospital follow up on right breast abscess and cellulitis. She presented to clinic on 01/16/17 and saw my partner Dr. Benjamine Mola who performed an I and D. That night she spiked a temperature of 104 and went to ED. She was admited on 01/18/2017 and discharged on 01/24/17. Culture was positive for MRSA and was put on IV ceftroline 600mg  bid and switched to clinidamycin 450mg  TID for another 7 days.CT negative for abscess. Has appt with infectious disease tomorrow, 01/29/17. She is doing better today. She has no pain. Her husband is packing irrigating and packing wound daily. No fever, chills, sweats.  .. Active Ambulatory Problems    Diagnosis Date Noted  . Endometrial cancer ([place]) 05/16/2014  . Essential hypertension, benign 05/17/2014  . Insomnia 05/17/2014  . Anxiety state 05/17/2014  . Right tibial fracture 05/17/2014  . Esophageal reflux 05/17/2014  . Vitamin D deficiency 05/19/2014  . Elevated liver enzymes 05/19/2014  . Primary osteoarthritis of both knees 05/31/2014  . BPV (benign positional vertigo) 09/30/2014  . Fatty liver disease, nonalcoholic 27/78/2423  . Abdominal aortic aneurysm ([place]) 10/21/2014  . Abdominal pain, left upper quadrant 10/28/2014  . Diabetes mellitus type II, controlled ([place]) 12/28/2014  . LUQ pain 12/28/2014  . Abdominal pain, right upper quadrant 07/25/2015  . Diarrhea 07/25/2015  . Bloating 07/25/2015  . DDD (degenerative disc disease), cervical 08/08/2015  . Muscle spasms of head or neck 08/08/2015  . Notalgia 08/08/2015  . Morbid obesity ([place]) 10/13/2015  . Pre-diabetes 10/13/2015  . Hyperlipidemia 10/13/2015  . Heaviness of upper extremity 11/28/2016  . Abscess of breast, right 01/16/2017  . Cellulitis of right breast 01/28/2017  . Abscess of right breast 01/28/2017  . MRSA infection  01/28/2017   Resolved Ambulatory Problems    Diagnosis Date Noted  . No Resolved Ambulatory Problems   Past Medical History:  Diagnosis Date  . Cancer [facility])       Review of Systems  All other systems reviewed and are negative.      Objective:   Physical Exam  Constitutional: She is oriented to person, place, and time. She appears well-developed and well-nourished.  Morbidly obese.   HENT:  Head: Normocephalic and atraumatic.  Cardiovascular: Normal rate, regular rhythm and normal heart sounds.   Pulmonary/Chest: Effort normal and breath sounds normal. She has no wheezes.  Right breast incision with packing and minimal drainage. Continues to have some warmth and redness surrounding incision of abscess on entire breast. No tenderness to palpation.   Neurological: She is alert and oriented to person, place, and time.  Psychiatric: She has a normal mood and affect. Her behavior is normal.          Assessment & Plan:  [name] KitchenMarland KitchenDiagnoses and all orders for this visit:  Abscess of right breast  Cellulitis of right breast  MRSA infection -     mupirocin nasal ointment (BACTROBAN) 2 %; Place 1 application into the nose 2 (two) times daily. Apply to both nares BID for 5 days once a month.  Other orders -     Discontinue: mupirocin nasal ointment (BACTROBAN) 2 %; Place 1 application into the nose 2 (two) times daily. Apply to both nares BID for 5 days once a month.  appears to be improving.  Finish clindamycin.  Continue  nightly irrigation and packing.  Keep appt with ID tomorrow.  Consider bactroban monthly nasally to keep colonazation of MRSA under control.  Consider washing with hibiclens monthly to keep colonization of MRSA under control.

## 2017-01-29 DIAGNOSIS — N611 Abscess of the breast and nipple: Secondary | ICD-10-CM | POA: Diagnosis not present

## 2017-01-29 DIAGNOSIS — Z6841 Body Mass Index (BMI) 40.0 and over, adult: Secondary | ICD-10-CM | POA: Diagnosis not present

## 2017-01-29 DIAGNOSIS — N61 Mastitis without abscess: Secondary | ICD-10-CM | POA: Diagnosis not present

## 2017-01-29 DIAGNOSIS — A4902 Methicillin resistant Staphylococcus aureus infection, unspecified site: Secondary | ICD-10-CM | POA: Diagnosis not present

## 2017-02-05 ENCOUNTER — Other Ambulatory Visit: Payer: Self-pay | Admitting: Physician Assistant

## 2017-02-12 DIAGNOSIS — A4902 Methicillin resistant Staphylococcus aureus infection, unspecified site: Secondary | ICD-10-CM | POA: Diagnosis not present

## 2017-02-12 DIAGNOSIS — Z6841 Body Mass Index (BMI) 40.0 and over, adult: Secondary | ICD-10-CM | POA: Diagnosis not present

## 2017-02-12 DIAGNOSIS — N61 Mastitis without abscess: Secondary | ICD-10-CM | POA: Diagnosis not present

## 2017-02-12 DIAGNOSIS — N611 Abscess of the breast and nipple: Secondary | ICD-10-CM | POA: Diagnosis not present

## 2017-02-14 ENCOUNTER — Other Ambulatory Visit: Payer: Self-pay | Admitting: Physician Assistant

## 2017-02-14 DIAGNOSIS — I10 Essential (primary) hypertension: Secondary | ICD-10-CM

## 2017-02-17 DIAGNOSIS — G4733 Obstructive sleep apnea (adult) (pediatric): Secondary | ICD-10-CM | POA: Diagnosis not present

## 2017-02-21 NOTE — Progress Notes (Deleted)
Referring-Breeback, Tamara L PA-C Reason for referral-abdominal aortic aneurysm and chest pain  HPI: 51 year old female for evaluation of abdominal aortic aneurysm and chest pain at request of Iran Planas, PA-C. Abdominal ultrasound 2/16 showed a 3.6 cm abdominal aortic aneurysm. CTA June 2016 showed no thoracic or abdominal aortic aneurysm. Follow-up ultrasounds November 2016 and September 2017 aorta not well visualized.   Current Outpatient Prescriptions  Medication Sig Dispense Refill  . ALPRAZolam (XANAX) 0.5 MG tablet TAKE ONE TABLET BY MOUTH AT BEDTIME 30 tablet 5  . aspirin EC 81 MG tablet Take 81 mg by mouth daily.    [name] Kitchen atorvastatin (LIPITOR) 40 MG tablet TAKE ONE TABLET BY MOUTH ONCE DAILY 30 tablet 11  . cyclobenzaprine (FLEXERIL) 10 MG tablet Take 1 tablet (10 mg total) by mouth 3 (three) times daily as needed for muscle spasms. 30 tablet 0  . Diclofenac Sodium (PENNSAID) 2 % SOLN Apply 2 pumps to affected area twice daily as needed. 2 Bottle 3  . ergocalciferol (VITAMIN D2) 50000 units capsule Take 1 capsule (50,000 Units total) by mouth once a week. 12 capsule 0  . hydrochlorothiazide (HYDRODIURIL) 25 MG tablet Take 1 tablet (25 mg total) by mouth daily. 90 tablet 1  . HYDROcodone-acetaminophen (NORCO/VICODIN) 5-325 MG tablet Take 1 tablet by mouth every 8 (eight) hours as needed for moderate pain. 20 tablet 0  . linaclotide (LINZESS) 290 MCG CAPS capsule Take 1 capsule (290 mcg total) by mouth daily before breakfast. 30 capsule 5  . metoprolol succinate (TOPROL-XL) 25 MG 24 hr tablet Take 1 tablet (25 mg total) by mouth daily. 90 tablet 1  . mupirocin nasal ointment (BACTROBAN) 2 % Place 1 application into the nose 2 (two) times daily. Apply to both nares BID for 5 days once a month. 30 g 1  . olmesartan-hydrochlorothiazide (BENICAR HCT) 40-12.5 MG tablet TAKE 1 TABLET BY MOUTH ONCE DAILY 30 tablet 1  . pantoprazole (PROTONIX) 40 MG tablet Take 1 tablet (40 mg total) by mouth  daily. 90 tablet 1  . traZODone (DESYREL) 50 MG tablet Take 0.5-1 tablets (25-50 mg total) by mouth at bedtime as needed for sleep. 30 tablet 3   No current facility-administered medications for this visit.     Allergies  Allergen Reactions  . Dexilant [Dexlansoprazole]     Whole body aches/pain.   . Diclofenac     Topical cream gave RASH.   [name] Kitchen Metformin And Related     GI side effects    Past Medical History:  Diagnosis Date  . Abdominal aortic aneurysm ([place]) 10/21/2014   (CT June 2016 via [facility] contests this Dx) 3.7cm. Asymptomatic. Follow up ultrasound in 2 years.  U/S 07/2015 stable. Managed by surgeon.    . Abdominal pain, left upper quadrant 10/28/2014  . Abdominal pain, right upper quadrant 07/25/2015  . Abscess of breast, right 01/16/2017  . Abscess of right breast 01/28/2017  . Anxiety state 05/17/2014  . Bloating 07/25/2015  . BPV (benign positional vertigo) 09/30/2014  . Cancer ([place])   . Cellulitis of right breast 01/28/2017  . DDD (degenerative disc disease), cervical 08/08/2015  . Diabetes mellitus type II, controlled ([place]) 12/28/2014  . Diarrhea 07/25/2015  . Elevated liver enzymes 05/19/2014  . Endometrial cancer ([place]) 05/16/2014   2010 surgery hysterectomy. Remission.    . Esophageal reflux 05/17/2014   7 years endoscopy normal.    . Essential hypertension, benign 05/17/2014  . Fatty liver disease, nonalcoholic 5/42/7062  . Heaviness of upper  extremity 11/28/2016  . Hyperlipidemia 10/13/2015  . Insomnia 05/17/2014  . LUQ pain 12/28/2014  . Morbid obesity ([place]) 10/13/2015  . MRSA infection 01/28/2017  . Muscle spasms of head or neck 08/08/2015  . Notalgia 08/08/2015  . Pre-diabetes 10/13/2015  . Primary osteoarthritis of both knees 05/31/2014  . Right tibial fracture 05/17/2014   Repaired in 2014. Continues to walk with cane.    . Vitamin D deficiency 05/19/2014    Past Surgical History:  Procedure Laterality Date  . ABDOMINAL HYSTERECTOMY    . ORTHOPEDIC SURGERY      steel rod placed in tibia    Social History   Social History  . Marital status: Married    Spouse name: N/A  . Number of children: N/A  . Years of education: N/A   Occupational History  . Not on file.   Social History Main Topics  . Smoking status: Never Smoker  . Smokeless tobacco: Never Used  . Alcohol use No  . Drug use: No  . Sexual activity: Yes   Other Topics Concern  . Not on file   Social History Narrative  . No narrative on file    Family History  Problem Relation Age of Onset  . Diabetes Mother     ROS: no fevers or chills, productive cough, hemoptysis, dysphasia, odynophagia, melena, hematochezia, dysuria, hematuria, rash, seizure activity, orthopnea, PND, pedal edema, claudication. Remaining systems are negative.  Physical Exam:   There were no vitals taken for this visit.  General:  Well developed/well nourished in NAD Skin warm/dry Patient not depressed No peripheral clubbing Back-normal HEENT-normal/normal eyelids Neck supple/normal carotid upstroke bilaterally; no bruits; no JVD; no thyromegaly chest - CTA/ normal expansion CV - RRR/normal S1 and S2; no murmurs, rubs or gallops;  PMI nondisplaced Abdomen -NT/ND, no HSM, no mass, + bowel sounds, no bruit 2+ femoral pulses, no bruits Ext-no edema, chords, 2+ DP Neuro-grossly nonfocal  ECG - personally reviewed  A/P  1  Kirk Ruths, MD

## 2017-02-27 ENCOUNTER — Ambulatory Visit: Payer: BLUE CROSS/BLUE SHIELD | Admitting: Cardiology

## 2017-04-02 ENCOUNTER — Ambulatory Visit: Payer: BLUE CROSS/BLUE SHIELD | Admitting: Cardiovascular Disease

## 2017-04-10 DIAGNOSIS — R52 Pain, unspecified: Secondary | ICD-10-CM | POA: Diagnosis not present

## 2017-04-10 DIAGNOSIS — S92315A Nondisplaced fracture of first metatarsal bone, left foot, initial encounter for closed fracture: Secondary | ICD-10-CM | POA: Diagnosis not present

## 2017-04-18 ENCOUNTER — Other Ambulatory Visit: Payer: Self-pay | Admitting: Physician Assistant

## 2017-04-18 DIAGNOSIS — I10 Essential (primary) hypertension: Secondary | ICD-10-CM

## 2017-04-30 ENCOUNTER — Encounter: Payer: Self-pay | Admitting: Cardiovascular Disease

## 2017-04-30 ENCOUNTER — Ambulatory Visit (INDEPENDENT_AMBULATORY_CARE_PROVIDER_SITE_OTHER): Payer: BLUE CROSS/BLUE SHIELD | Admitting: Cardiovascular Disease

## 2017-04-30 VITALS — BP 136/68 | HR 71 | Ht 60.0 in | Wt 329.2 lb

## 2017-04-30 DIAGNOSIS — I209 Angina pectoris, unspecified: Secondary | ICD-10-CM

## 2017-04-30 DIAGNOSIS — I714 Abdominal aortic aneurysm, without rupture, unspecified: Secondary | ICD-10-CM

## 2017-04-30 DIAGNOSIS — R0789 Other chest pain: Secondary | ICD-10-CM | POA: Diagnosis not present

## 2017-04-30 DIAGNOSIS — E7800 Pure hypercholesterolemia, unspecified: Secondary | ICD-10-CM | POA: Diagnosis not present

## 2017-04-30 DIAGNOSIS — R002 Palpitations: Secondary | ICD-10-CM | POA: Diagnosis not present

## 2017-04-30 DIAGNOSIS — I10 Essential (primary) hypertension: Secondary | ICD-10-CM | POA: Diagnosis not present

## 2017-04-30 NOTE — Patient Instructions (Addendum)
Medication Instructions:  Your physician recommends that you continue on your current medications as directed. Please refer to the Current Medication list given to you today.  Labwork: BMET/CBC/PT/INR NEXT WEEK   Testing/Procedures: Your physician has requested that you have an abdominal aorta duplex. During this test, an ultrasound is used to evaluate the aorta. Allow 30 minutes for this exam. Do not eat after midnight the day before and avoid carbonated beverages  Your physician has recommended that you wear an event monitor. Event monitors are medical devices that record the heart's electrical activity. Doctors most often [place] these monitors to diagnose arrhythmias. Arrhythmias are problems with the speed or rhythm of the heartbeat. The monitor is a small, portable device. You can wear one while you do your normal daily activities. This is usually used to diagnose what is causing palpitations/syncope (passing out). [place] has requested that you have a cardiac catheterization. Cardiac catheterization is used to diagnose and/or treat various heart conditions. Doctors may recommend this procedure for a number of different reasons. The most common reason is to evaluate chest pain. Chest pain can be a symptom of coronary artery disease (CAD), and cardiac catheterization can show whether plaque is narrowing or blocking your heart's arteries. This procedure is also used to evaluate the valves, as well as measure the blood flow and oxygen levels in different parts of your heart. For further information please visit [email or web address]tn. Please follow instruction sheet, as given.  Follow-Up: Your physician recommends that you schedule a follow-up appointment in: 1 MONTH OV  Any Other Special Instructions Will Be Listed Below (If Applicable).               [place] [address] [place] [address] Dept: [phone] Loc: [phone]  Tamara Greer  04/30/2017  You are scheduled for a Cardiac Catheterization on Monday, September 10 with Dr. Sherren Mocha.  1. Please arrive at the [facility] (Main Entrance A) at [facility]: [place],  [address] at 10:00 AM (two hours before your procedure to ensure your preparation). Free valet parking service is available.   Special note: Every effort is made to have your procedure done on time. Please understand that emergencies sometimes delay scheduled procedures.  2. Diet: Do not eat or drink anything after midnight prior to your procedure except sips of water to take medications.  3. Labs: AT [facility] OFFICE NEXT WEEK   4. Medication instructions in preparation for your procedure:  OLMESARTAN AND HYDROCHLOROTHIAZIDE   On the morning of your procedure, take your Aspirin and any morning medicines NOT listed above.  You may use sips of water.  5. Plan for one night stay--bring personal belongings. 6. Bring a current list of your medications and current insurance cards. 7. You MUST have a responsible person to drive you home. 8. Someone MUST be with you the first 24 hours after you arrive home or your discharge will be delayed. 9. Please wear clothes that are easy to get on and off and wear slip-on shoes.  Thank you for allowing [place] to care for you!   -- [facility]

## 2017-04-30 NOTE — Progress Notes (Signed)
Cardiology Office Note   Date:  05/01/2017   ID:  Allexis Bordenave, DOB 22-Sep-1965, MRN [id number]  PCP:  Donella Stade, PA-C  Cardiologist:   Skeet Latch, MD   Chief Complaint  Patient presents with  . New Patient (Initial Visit)    diagnosed 2 yrs ago with aortic aneurysm. treated in [place], Dr Janet Berlin [facility]). here for second opinion, pt reports increased shortness of breath and light chest tightness/pressure.      History of Present Illness: Leightyn Cina is a 51 y.o. female with AAA, diabetes mellitus type 2, hypertension, hyperlipidemia,GERD, and endometrial cancer who presents for an evaluation of chest pain.  For the last 2 weeks she reports intermittent episodes of substernal chest pain radiating into her right breast. The episodes last for a few minutes at a time. She notes it tmost commonly when she's doing strenuous tasks like her laundry or cleaning her home. It is associated with shortness of breath and diaphoresis but no nausea.  Ms. Musolino Previously complained of left arm heaviness and atypical chest pain.  She saw Iran Planas, PA-C, on 10/2016 and was referred to cardiology but did not initially follow-up. She reports having a nuclear stress test at [facility] several years ago that was very traumatic. She thought that she was going to die and refuses to ever have another.  She also reports palpitations that occur randomly and last for a few seconds at a time. There is no associated chest pain or shortness of breath, but she does get lightheaded. She drinks one drink with caffeine daily and does not regularly use over the counter cold or cough medication. She reports chronic lower extremity edema that improves with elevation. She denies orthopnea or PND, but she has been wheezing and coughing at night. She has GERD but reports that it has been well-controlled lately. Ms. Economos has not been exercising lately. She attributes this to  arthritis and a steel plate in her right leg.   Past Medical History:  Diagnosis Date  . Abdominal aortic aneurysm ([place]) 10/21/2014   (CT June 2016 via [facility] contests this Dx) 3.7cm. Asymptomatic. Follow up ultrasound in 2 years.  U/S 07/2015 stable. Managed by surgeon.    . Abdominal pain, left upper quadrant 10/28/2014  . Abdominal pain, right upper quadrant 07/25/2015  . Abscess of breast, right 01/16/2017  . Abscess of right breast 01/28/2017  . Anxiety state 05/17/2014  . Bloating 07/25/2015  . BPV (benign positional vertigo) 09/30/2014  . Cancer ([place])   . Cellulitis of right breast 01/28/2017  . DDD (degenerative disc disease), cervical 08/08/2015  . Diabetes mellitus type II, controlled ([place]) 12/28/2014  . Diarrhea 07/25/2015  . Elevated liver enzymes 05/19/2014  . Endometrial cancer ([place]) 05/16/2014   2010 surgery hysterectomy. Remission.    . Esophageal reflux 05/17/2014   7 years endoscopy normal.    . Essential hypertension, benign 05/17/2014  . Fatty liver disease, nonalcoholic 0/25/4270  . Heaviness of upper extremity 11/28/2016  . Hyperlipidemia 10/13/2015  . Insomnia 05/17/2014  . LUQ pain 12/28/2014  . Morbid obesity ([place]) 10/13/2015  . MRSA infection 01/28/2017  . Muscle spasms of head or neck 08/08/2015  . Notalgia 08/08/2015  . Pre-diabetes 10/13/2015  . Primary osteoarthritis of both knees 05/31/2014  . Right tibial fracture 05/17/2014   Repaired in 2014. Continues to walk with cane.    . Vitamin D deficiency 05/19/2014    Past Surgical History:  Procedure Laterality  Date  . ABDOMINAL HYSTERECTOMY    . ORTHOPEDIC SURGERY     steel rod placed in tibia     Current Outpatient Prescriptions  Medication Sig Dispense Refill  . ALPRAZolam (XANAX) 0.5 MG tablet TAKE ONE TABLET BY MOUTH AT BEDTIME 30 tablet 5  . aspirin EC 81 MG tablet Take 81 mg by mouth daily.    [name] Kitchen atorvastatin (LIPITOR) 40 MG tablet TAKE ONE TABLET BY MOUTH ONCE DAILY 30 tablet 11  . hydrochlorothiazide  (HYDRODIURIL) 25 MG tablet Take 1 tablet (25 mg total) by mouth daily. 90 tablet 1  . olmesartan-hydrochlorothiazide (BENICAR HCT) 40-12.5 MG tablet TAKE 1 TABLET BY MOUTH ONCE DAILY 30 tablet 1  . pantoprazole (PROTONIX) 40 MG tablet Take 1 tablet (40 mg total) by mouth daily. 90 tablet 1   No current facility-administered medications for this visit.     Allergies:   Dexilant [dexlansoprazole]; Diclofenac; and Metformin and related    Social History:  The patient  reports that she has never smoked. She has never used smokeless tobacco. She reports that she does not drink alcohol or use drugs.   Family History:  The patient's family history includes COPD in her father; Diabetes in her mother; Heart failure in her father and mother; Stroke in her mother.    ROS:  Please see the history of present illness.   Otherwise, review of systems are positive for none.   All other systems are reviewed and negative.    PHYSICAL EXAM: VS:  BP 136/68 (BP Location: Right Arm, Patient Position: Sitting, Cuff Size: Large) Comment (BP Location): forearm  Pulse 71   Ht 5' (1.524 m)   Wt (!) 149.3 kg (329 lb 3.2 oz)   BMI 64.29 kg/m  , BMI Body mass index is 64.29 kg/m. GENERAL:  Well appearing. Morbidly obese. HEENT:  Pupils equal round and reactive, fundi not visualized, oral mucosa unremarkable NECK:  No jugular venous distention, waveform within normal limits, carotid upstroke brisk and symmetric, no bruits  LUNGS:  Clear to auscultation bilaterally HEART:  RRR.  PMI not displaced or sustained,S1 and S2 within normal limits, no S3, no S4, no clicks, no rubs, no murmurs ABD:  Flat, positive bowel sounds normal in frequency in pitch, no bruits, no rebound, no guarding, no midline pulsatile mass, no hepatomegaly, no splenomegaly EXT:  2 plus pulses throughout, no edema, no cyanosis no clubbing SKIN:  No rashes no nodules NEURO:  Cranial nerves II through XII grossly intact, motor grossly intact  throughout PSYCH:  Cognitively intact, oriented to person place and time    EKG:  EKG is ordered today. The ekg ordered today demonstrates Sinus rhythm. Rate 71 bpm.   Recent Labs: 11/26/2016: ALT 48; BUN 21; Creat 0.64; Hemoglobin 12.9; Platelets 249; Potassium 4.6; Sodium 135; TSH 3.18    Lipid Panel    Component Value Date/Time   CHOL 163 11/26/2016 1459   TRIG 125 11/26/2016 1459   HDL 51 11/26/2016 1459   CHOLHDL 3.2 11/26/2016 1459   VLDL 25 11/26/2016 1459   LDLCALC 87 11/26/2016 1459      Wt Readings from Last 3 Encounters:  04/30/17 (!) 149.3 kg (329 lb 3.2 oz)  11/26/16 (!) 146.1 kg (322 lb)  07/23/16 (!) 144.7 kg (319 lb)      ASSESSMENT AND PLAN:  # Angina: Ms. Alcaide' chest pain is consistent with angina. She is adamant that she will never have another nuclear stres test. Given her body habitus it  is unlikely that a nuclear test, stress echo or coronary CT-A would be accurate. We will proceed with left heart catheterization.  Risks and benefits of cardiac catheterization have been discussed with the patient.  The patient understands that risks included but are not limited to stroke (1 in 1000), death (1 in 9), kidney failure [usually temporary] (1 in 500), bleeding (1 in 200), allergic reaction [possibly serious] (1 in 200). The patient understands and agrees to proceed.   # Palpitations: We will get a 10 day event monitor. Check CMP, CBC, magnesium, TSH and free T4. Palp labs  # AAA: Repeat abdominal ultrasound. Her last one 05/2016 was inconclusive. Prior to that it was 3.6 cm. Continue metoprolol, aspirin and statin. We discussed the importance of exercise, but she is limited by arthritis and morbid obesity.  # Hypertension: Continue metoprolol, HCTZ and olmesartan.  # Hyperlipidemia: Continue atorvastatin. LDL 87 10/2016.This should be increased to 80mg . Will discuss at follow up.     Current medicines are reviewed at length with the patient today.   The patient does not have concerns regarding medicines.  The following changes have been made:  no change  Labs/ tests ordered today include:   Orders Placed This Encounter  Procedures  . CBC with Differential/Platelet  . Basic metabolic panel  . INR/PT  . Cardiac event monitor  . EKG 12-Lead     Disposition:   FU with Ailey Wessling C. Oval Linsey, MD, [facility] in 1 month.    This note was written with the assistance of speech recognition software.  Please excuse any transcriptional errors.  Signed, Mattie Novosel C. Oval Linsey, MD, [facility]  05/01/2017 6:29 AM    [facility]

## 2017-05-08 ENCOUNTER — Telehealth: Payer: Self-pay | Admitting: Cardiovascular Disease

## 2017-05-08 NOTE — Telephone Encounter (Signed)
New message       Pt want to resc her Monday procedure.  Please resc after oct 6

## 2017-05-08 NOTE — Telephone Encounter (Signed)
S/w pt she states that she is having trouble finding someone to bring her to appt and stay with her for 24 hours. She states taht she know how important this procedure is and she is very concerned about it (she is crying) she just cannot get a driver. She states that she would like to reschedule until 05-16-17. Is this ok?

## 2017-05-08 NOTE — Telephone Encounter (Signed)
Spoke with patient will call in am to reschedule

## 2017-05-09 ENCOUNTER — Ambulatory Visit (HOSPITAL_COMMUNITY)
Admission: RE | Admit: 2017-05-09 | Discharge: 2017-05-09 | Disposition: A | Discharge status: Home / Self Care | Payer: BLUE CROSS/BLUE SHIELD | Source: Ambulatory Visit | Attending: Cardiovascular Disease | Admitting: Cardiovascular Disease

## 2017-05-09 ENCOUNTER — Encounter: Payer: Self-pay | Admitting: *Deleted

## 2017-05-09 DIAGNOSIS — I714 Abdominal aortic aneurysm, without rupture, unspecified: Secondary | ICD-10-CM

## 2017-05-09 DIAGNOSIS — R0789 Other chest pain: Secondary | ICD-10-CM | POA: Diagnosis not present

## 2017-05-09 DIAGNOSIS — R002 Palpitations: Secondary | ICD-10-CM | POA: Diagnosis not present

## 2017-05-09 LAB — CBC WITH DIFFERENTIAL/PLATELET
Basophils Absolute: 0 10*3/uL (ref 0.0–0.2)
Basos: 1 %
EOS (ABSOLUTE): 0.1 10*3/uL (ref 0.0–0.4)
Eos: 2 %
HEMATOCRIT: 37.3 % (ref 34.0–46.6)
Hemoglobin: 12.8 g/dL (ref 11.1–15.9)
Immature Grans (Abs): 0 10*3/uL (ref 0.0–0.1)
Immature Granulocytes: 0 %
LYMPHS ABS: 1.9 10*3/uL (ref 0.7–3.1)
Lymphs: 30 %
MCH: 31.3 pg (ref 26.6–33.0)
MCHC: 34.3 g/dL (ref 31.5–35.7)
MCV: 91 fL (ref 79–97)
MONOS ABS: 0.4 10*3/uL (ref 0.1–0.9)
Monocytes: 6 %
Neutrophils Absolute: 3.9 10*3/uL (ref 1.4–7.0)
Neutrophils: 61 %
Platelets: 202 10*3/uL (ref 150–379)
RBC: 4.09 x10E6/uL (ref 3.77–5.28)
RDW: 13.1 % (ref 12.3–15.4)
WBC: 6.4 10*3/uL (ref 3.4–10.8)

## 2017-05-09 LAB — BASIC METABOLIC PANEL
BUN/Creatinine Ratio: 27 — ABNORMAL HIGH (ref 9–23)
BUN: 16 mg/dL (ref 6–24)
CO2: 22 mmol/L (ref 20–29)
Calcium: 9.6 mg/dL (ref 8.7–10.2)
Chloride: 98 mmol/L (ref 96–106)
Creatinine, Ser: 0.59 mg/dL (ref 0.57–1.00)
GFR, EST AFRICAN AMERICAN: 121 mL/min/{1.73_m2} (ref >59)
GFR, EST NON AFRICAN AMERICAN: 105 mL/min/{1.73_m2} (ref >59)
Glucose: 187 mg/dL — ABNORMAL HIGH (ref 65–99)
Potassium: 4.5 mmol/L (ref 3.5–5.2)
SODIUM: 136 mmol/L (ref 134–144)

## 2017-05-09 LAB — PROTIME-INR
INR: 1 (ref 0.8–1.2)
PROTHROMBIN TIME: 10.6 s (ref 9.1–12.0)

## 2017-05-09 NOTE — Telephone Encounter (Signed)
Rescheduled cath for 05/16/17 with Dr End Patient aware of date ant time

## 2017-05-14 ENCOUNTER — Telehealth: Payer: Self-pay

## 2017-05-14 NOTE — Telephone Encounter (Signed)
Call placed to Pt prior to arrival of inclement weather.  Asked if Pt would like to reschedule cath.  Pt spoke with husband.  Requested procedure be rescheduled for Monday 05/19/2017.  Pt accommodated.  Pt notified procedure rescheduled.  Pt indicates understanding.  Pt pre cath instruction completed.  No further needs  Patient contacted pre-catheterization at [facility] scheduled for:  05/19/2017 @ 1030 Verified arrival time and place: NT @ 0800 Confirmed AM meds to be taken pre-cath with sip of water: Take ASA Hold HCTZ and Benicar HCT Confirmed patient has responsible person to drive home post procedure and observe patient for 24 hours:  yes Addl concerns:  All questions answered to Pt satisfaction.

## 2017-05-19 ENCOUNTER — Encounter (HOSPITAL_COMMUNITY)
Admission: RE | Disposition: A | Discharge status: Home / Self Care | Payer: Self-pay | Source: Ambulatory Visit | Attending: Cardiovascular Disease

## 2017-05-19 ENCOUNTER — Ambulatory Visit (HOSPITAL_COMMUNITY)
Admission: RE | Admit: 2017-05-19 | Discharge: 2017-05-19 | Disposition: A | Discharge status: Home / Self Care | Payer: BLUE CROSS/BLUE SHIELD | Source: Ambulatory Visit | Attending: Cardiovascular Disease | Admitting: Cardiovascular Disease

## 2017-05-19 DIAGNOSIS — Z90710 Acquired absence of both cervix and uterus: Secondary | ICD-10-CM | POA: Insufficient documentation

## 2017-05-19 DIAGNOSIS — Z79899 Other long term (current) drug therapy: Secondary | ICD-10-CM | POA: Diagnosis not present

## 2017-05-19 DIAGNOSIS — Z6841 Body Mass Index (BMI) 40.0 and over, adult: Secondary | ICD-10-CM | POA: Diagnosis not present

## 2017-05-19 DIAGNOSIS — Z8614 Personal history of Methicillin resistant Staphylococcus aureus infection: Secondary | ICD-10-CM | POA: Insufficient documentation

## 2017-05-19 DIAGNOSIS — E785 Hyperlipidemia, unspecified: Secondary | ICD-10-CM | POA: Insufficient documentation

## 2017-05-19 DIAGNOSIS — M5030 Other cervical disc degeneration, unspecified cervical region: Secondary | ICD-10-CM | POA: Diagnosis not present

## 2017-05-19 DIAGNOSIS — Z8542 Personal history of malignant neoplasm of other parts of uterus: Secondary | ICD-10-CM | POA: Diagnosis not present

## 2017-05-19 DIAGNOSIS — K219 Gastro-esophageal reflux disease without esophagitis: Secondary | ICD-10-CM | POA: Diagnosis not present

## 2017-05-19 DIAGNOSIS — I714 Abdominal aortic aneurysm, without rupture: Secondary | ICD-10-CM | POA: Insufficient documentation

## 2017-05-19 DIAGNOSIS — K760 Fatty (change of) liver, not elsewhere classified: Secondary | ICD-10-CM | POA: Diagnosis not present

## 2017-05-19 DIAGNOSIS — I10 Essential (primary) hypertension: Secondary | ICD-10-CM | POA: Insufficient documentation

## 2017-05-19 DIAGNOSIS — E119 Type 2 diabetes mellitus without complications: Secondary | ICD-10-CM | POA: Insufficient documentation

## 2017-05-19 DIAGNOSIS — I209 Angina pectoris, unspecified: Secondary | ICD-10-CM

## 2017-05-19 DIAGNOSIS — Z7982 Long term (current) use of aspirin: Secondary | ICD-10-CM | POA: Insufficient documentation

## 2017-05-19 DIAGNOSIS — I25119 Atherosclerotic heart disease of native coronary artery with unspecified angina pectoris: Secondary | ICD-10-CM | POA: Diagnosis not present

## 2017-05-19 HISTORY — PX: LEFT HEART CATH AND CORONARY ANGIOGRAPHY: CATH118249

## 2017-05-19 LAB — GLUCOSE, CAPILLARY: GLUCOSE-CAPILLARY: 191 mg/dL — AB (ref 65–99)

## 2017-05-19 SURGERY — LEFT HEART CATH AND CORONARY ANGIOGRAPHY
Anesthesia: LOCAL

## 2017-05-19 MED ORDER — IOPAMIDOL (ISOVUE-370) INJECTION 76%
INTRAVENOUS | Status: DC | PRN
  Administered 2017-05-19: 40 mL via INTRA_ARTERIAL

## 2017-05-19 MED ORDER — FENTANYL CITRATE (PF) 100 MCG/2ML IJ SOLN
INTRAMUSCULAR | Status: AC
  Filled 2017-05-19: qty 2

## 2017-05-19 MED ORDER — SODIUM CHLORIDE 0.9 % WEIGHT BASED INFUSION
3.0000 mL/kg/h | INTRAVENOUS | Status: AC
  Administered 2017-05-19: 3 mL/kg/h via INTRAVENOUS

## 2017-05-19 MED ORDER — LIDOCAINE HCL (PF) 1 % IJ SOLN
INTRAMUSCULAR | Status: AC
  Filled 2017-05-19: qty 30

## 2017-05-19 MED ORDER — MIDAZOLAM HCL 2 MG/2ML IJ SOLN
INTRAMUSCULAR | Status: AC
  Filled 2017-05-19: qty 2

## 2017-05-19 MED ORDER — FENTANYL CITRATE (PF) 100 MCG/2ML IJ SOLN
INTRAMUSCULAR | Status: DC | PRN
  Administered 2017-05-19: 50 ug via INTRAVENOUS
  Administered 2017-05-19: 25 ug via INTRAVENOUS

## 2017-05-19 MED ORDER — SODIUM CHLORIDE 0.9% FLUSH: 3.0000 mL | Freq: Two times a day (BID) | INTRAVENOUS | Status: DC

## 2017-05-19 MED ORDER — ACETAMINOPHEN 325 MG PO TABS
650.0000 mg | ORAL_TABLET | Freq: Once | ORAL | Status: AC
  Administered 2017-05-19: 650 mg via ORAL

## 2017-05-19 MED ORDER — ACETAMINOPHEN 325 MG PO TABS
ORAL_TABLET | ORAL | Status: AC
  Administered 2017-05-19: 650 mg via ORAL
  Filled 2017-05-19: qty 2

## 2017-05-19 MED ORDER — VERAPAMIL HCL 2.5 MG/ML IV SOLN
INTRAVENOUS | Status: DC | PRN
  Administered 2017-05-19: 10 mL via INTRA_ARTERIAL

## 2017-05-19 MED ORDER — HEPARIN SODIUM (PORCINE) 1000 UNIT/ML IJ SOLN
INTRAMUSCULAR | Status: AC
  Filled 2017-05-19: qty 1

## 2017-05-19 MED ORDER — ASPIRIN 81 MG PO CHEW: 81.0000 mg | CHEWABLE_TABLET | ORAL | Status: DC

## 2017-05-19 MED ORDER — HEPARIN (PORCINE) IN NACL 2-0.9 UNIT/ML-% IJ SOLN
INTRAMUSCULAR | Status: AC
Start: 2017-05-19 — End: 2017-05-19
  Filled 2017-05-19: qty 1000

## 2017-05-19 MED ORDER — SODIUM CHLORIDE 0.9% FLUSH: 3.0000 mL | INTRAVENOUS | Status: DC | PRN

## 2017-05-19 MED ORDER — SODIUM CHLORIDE 0.9 % IV SOLN: 250.0000 mL | INTRAVENOUS | Status: DC | PRN

## 2017-05-19 MED ORDER — LIDOCAINE HCL (PF) 1 % IJ SOLN
INTRAMUSCULAR | Status: DC | PRN
  Administered 2017-05-19: 6 mL via SUBCUTANEOUS

## 2017-05-19 MED ORDER — SODIUM CHLORIDE 0.9 % WEIGHT BASED INFUSION: 1.0000 mL/kg/h | INTRAVENOUS | Status: DC

## 2017-05-19 MED ORDER — VERAPAMIL HCL 2.5 MG/ML IV SOLN
INTRAVENOUS | Status: AC
  Filled 2017-05-19: qty 2

## 2017-05-19 MED ORDER — IOPAMIDOL (ISOVUE-370) INJECTION 76%
INTRAVENOUS | Status: AC
  Filled 2017-05-19: qty 100

## 2017-05-19 MED ORDER — SODIUM CHLORIDE 0.9 % IV SOLN: INTRAVENOUS | Status: AC

## 2017-05-19 MED ORDER — MIDAZOLAM HCL 2 MG/2ML IJ SOLN
INTRAMUSCULAR | Status: DC | PRN
  Administered 2017-05-19: 1 mg via INTRAVENOUS
  Administered 2017-05-19: 2 mg via INTRAVENOUS

## 2017-05-19 MED ORDER — HEPARIN SODIUM (PORCINE) 1000 UNIT/ML IJ SOLN
INTRAMUSCULAR | Status: DC | PRN
  Administered 2017-05-19: 6000 [IU] via INTRAVENOUS

## 2017-05-19 MED ORDER — HEPARIN (PORCINE) IN NACL 2-0.9 UNIT/ML-% IJ SOLN
INTRAMUSCULAR | Status: AC | PRN
  Administered 2017-05-19: 1000 mL

## 2017-05-19 SURGICAL SUPPLY — 12 items
CATH INFINITI 5 FR JL3.5 (CATHETERS) ×2 IMPLANT
CATH INFINITI JR4 5F (CATHETERS) ×2 IMPLANT
COVER PRB 48X5XTLSCP FOLD TPE (BAG) ×1 IMPLANT
COVER PROBE 5X48 (BAG) ×1
DEVICE RAD COMP TR BAND LRG (VASCULAR PRODUCTS) ×2 IMPLANT
GLIDESHEATH SLEND SS 6F .021 (SHEATH) ×2 IMPLANT
GUIDEWIRE INQWIRE 1.5J.035X260 (WIRE) ×1 IMPLANT
INQWIRE 1.5J .035X260CM (WIRE) ×2
KIT HEART LEFT (KITS) ×2 IMPLANT
PACK CARDIAC CATHETERIZATION (CUSTOM PROCEDURE TRAY) ×2 IMPLANT
TRANSDUCER W/STOPCOCK (MISCELLANEOUS) ×2 IMPLANT
TUBING CIL FLEX 10 FLL-RA (TUBING) ×2 IMPLANT

## 2017-05-19 NOTE — Interval H&P Note (Signed)
Cath Lab Visit (complete for each Cath Lab visit)  Clinical Evaluation Leading to the Procedure:   ACS: No.  Non-ACS:    Anginal Classification: CCS III  Anti-ischemic medical therapy: Minimal Therapy (1 class of medications)  Non-Invasive Test Results: No non-invasive testing performed  Prior CABG: No previous CABG      History and Physical Interval Note:  05/19/2017 11:12 AM  Tamara Greer  has presented today for surgery, with the diagnosis of cp  The various methods of treatment have been discussed with the patient and family. After consideration of risks, benefits and other options for treatment, the patient has consented to  Procedure(s): LEFT HEART CATH AND CORONARY ANGIOGRAPHY (N/A) as a surgical intervention .  The patient's history has been reviewed, patient examined, no change in status, stable for surgery.  I have reviewed the patient's chart and labs.  Questions were answered to the patient's satisfaction.     Larae Grooms

## 2017-05-19 NOTE — Discharge Instructions (Signed)

## 2017-05-19 NOTE — H&P (View-Only) (Signed)
Cardiology Office Note   Date:  05/01/2017   ID:  Tamara Greer, DOB 03-22-1966, MRN [id number]  PCP:  Donella Stade, PA-C  Cardiologist:   Skeet Latch, MD   Chief Complaint  Patient presents with  . New Patient (Initial Visit)    diagnosed 2 yrs ago with aortic aneurysm. treated in [place], Dr Janet Berlin [facility]). here for second opinion, pt reports increased shortness of breath and light chest tightness/pressure.      History of Present Illness: Tamara Greer is a 51 y.o. female with AAA, diabetes mellitus type 2, hypertension, hyperlipidemia,GERD, and endometrial cancer who presents for an evaluation of chest pain.  For the last 2 weeks she reports intermittent episodes of substernal chest pain radiating into her right breast. The episodes last for a few minutes at a time. She notes it tmost commonly when she's doing strenuous tasks like her laundry or cleaning her home. It is associated with shortness of breath and diaphoresis but no nausea.  [name] Previously complained of left arm heaviness and atypical chest pain.  She saw Iran Planas, PA-C, on 10/2016 and was referred to cardiology but did not initially follow-up. She reports having a nuclear stress test at [facility] several years ago that was very traumatic. She thought that she was going to die and refuses to ever have another.  She also reports palpitations that occur randomly and last for a few seconds at a time. There is no associated chest pain or shortness of breath, but she does get lightheaded. She drinks one drink with caffeine daily and does not regularly use over the counter cold or cough medication. She reports chronic lower extremity edema that improves with elevation. She denies orthopnea or PND, but she has been wheezing and coughing at night. She has GERD but reports that it has been well-controlled lately. [name] has not been exercising lately. She attributes this to  arthritis and a steel plate in her right leg.   Past Medical History:  Diagnosis Date  . Abdominal aortic aneurysm ([place]) 10/21/2014   (CT June 2016 via [facility] contests this Dx) 3.7cm. Asymptomatic. Follow up ultrasound in 2 years.  U/S 07/2015 stable. Managed by surgeon.    . Abdominal pain, left upper quadrant 10/28/2014  . Abdominal pain, right upper quadrant 07/25/2015  . Abscess of breast, right 01/16/2017  . Abscess of right breast 01/28/2017  . Anxiety state 05/17/2014  . Bloating 07/25/2015  . BPV (benign positional vertigo) 09/30/2014  . Cancer ([place])   . Cellulitis of right breast 01/28/2017  . DDD (degenerative disc disease), cervical 08/08/2015  . Diabetes mellitus type II, controlled ([place]) 12/28/2014  . Diarrhea 07/25/2015  . Elevated liver enzymes 05/19/2014  . Endometrial cancer ([place]) 05/16/2014   2010 surgery hysterectomy. Remission.    . Esophageal reflux 05/17/2014   7 years endoscopy normal.    . Essential hypertension, benign 05/17/2014  . Fatty liver disease, nonalcoholic 9/56/3875  . Heaviness of upper extremity 11/28/2016  . Hyperlipidemia 10/13/2015  . Insomnia 05/17/2014  . LUQ pain 12/28/2014  . Morbid obesity ([place]) 10/13/2015  . MRSA infection 01/28/2017  . Muscle spasms of head or neck 08/08/2015  . Notalgia 08/08/2015  . Pre-diabetes 10/13/2015  . Primary osteoarthritis of both knees 05/31/2014  . Right tibial fracture 05/17/2014   Repaired in 2014. Continues to walk with cane.    . Vitamin D deficiency 05/19/2014    Past Surgical History:  Procedure Laterality  Date  . ABDOMINAL HYSTERECTOMY    . ORTHOPEDIC SURGERY     steel rod placed in tibia     Current Outpatient Prescriptions  Medication Sig Dispense Refill  . ALPRAZolam (XANAX) 0.5 MG tablet TAKE ONE TABLET BY MOUTH AT BEDTIME 30 tablet 5  . aspirin EC 81 MG tablet Take 81 mg by mouth daily.    [name] Kitchen atorvastatin (LIPITOR) 40 MG tablet TAKE ONE TABLET BY MOUTH ONCE DAILY 30 tablet 11  . hydrochlorothiazide  (HYDRODIURIL) 25 MG tablet Take 1 tablet (25 mg total) by mouth daily. 90 tablet 1  . olmesartan-hydrochlorothiazide (BENICAR HCT) 40-12.5 MG tablet TAKE 1 TABLET BY MOUTH ONCE DAILY 30 tablet 1  . pantoprazole (PROTONIX) 40 MG tablet Take 1 tablet (40 mg total) by mouth daily. 90 tablet 1   No current facility-administered medications for this visit.     Allergies:   Dexilant [dexlansoprazole]; Diclofenac; and Metformin and related    Social History:  The patient  reports that she has never smoked. She has never used smokeless tobacco. She reports that she does not drink alcohol or use drugs.   Family History:  The patient's family history includes COPD in her father; Diabetes in her mother; Heart failure in her father and mother; Stroke in her mother.    ROS:  Please see the history of present illness.   Otherwise, review of systems are positive for none.   All other systems are reviewed and negative.    PHYSICAL EXAM: VS:  BP 136/68 (BP Location: Right Arm, Patient Position: Sitting, Cuff Size: Large) Comment (BP Location): forearm  Pulse 71   Ht 5' (1.524 m)   Wt (!) 149.3 kg (329 lb 3.2 oz)   BMI 64.29 kg/m  , BMI Body mass index is 64.29 kg/m. GENERAL:  Well appearing. Morbidly obese. HEENT:  Pupils equal round and reactive, fundi not visualized, oral mucosa unremarkable NECK:  No jugular venous distention, waveform within normal limits, carotid upstroke brisk and symmetric, no bruits  LUNGS:  Clear to auscultation bilaterally HEART:  RRR.  PMI not displaced or sustained,S1 and S2 within normal limits, no S3, no S4, no clicks, no rubs, no murmurs ABD:  Flat, positive bowel sounds normal in frequency in pitch, no bruits, no rebound, no guarding, no midline pulsatile mass, no hepatomegaly, no splenomegaly EXT:  2 plus pulses throughout, no edema, no cyanosis no clubbing SKIN:  No rashes no nodules NEURO:  Cranial nerves II through XII grossly intact, motor grossly intact  throughout PSYCH:  Cognitively intact, oriented to person place and time    EKG:  EKG is ordered today. The ekg ordered today demonstrates Sinus rhythm. Rate 71 bpm.   Recent Labs: 11/26/2016: ALT 48; BUN 21; Creat 0.64; Hemoglobin 12.9; Platelets 249; Potassium 4.6; Sodium 135; TSH 3.18    Lipid Panel    Component Value Date/Time   CHOL 163 11/26/2016 1459   TRIG 125 11/26/2016 1459   HDL 51 11/26/2016 1459   CHOLHDL 3.2 11/26/2016 1459   VLDL 25 11/26/2016 1459   LDLCALC 87 11/26/2016 1459      Wt Readings from Last 3 Encounters:  04/30/17 (!) 149.3 kg (329 lb 3.2 oz)  11/26/16 (!) 146.1 kg (322 lb)  07/23/16 (!) 144.7 kg (319 lb)      ASSESSMENT AND PLAN:  # Angina: Ms. Dry' chest pain is consistent with angina. She is adamant that she will never have another nuclear stres test. Given her body habitus it  is unlikely that a nuclear test, stress echo or coronary CT-A would be accurate. We will proceed with left heart catheterization.  Risks and benefits of cardiac catheterization have been discussed with the patient.  The patient understands that risks included but are not limited to stroke (1 in 1000), death (1 in 60), kidney failure [usually temporary] (1 in 500), bleeding (1 in 200), allergic reaction [possibly serious] (1 in 200). The patient understands and agrees to proceed.   # Palpitations: We will get a 10 day event monitor. Check CMP, CBC, magnesium, TSH and free T4. Palp labs  # AAA: Repeat abdominal ultrasound. Her last one 05/2016 was inconclusive. Prior to that it was 3.6 cm. Continue metoprolol, aspirin and statin. We discussed the importance of exercise, but she is limited by arthritis and morbid obesity.  # Hypertension: Continue metoprolol, HCTZ and olmesartan.  # Hyperlipidemia: Continue atorvastatin. LDL 87 10/2016.This should be increased to 80mg . Will discuss at follow up.     Current medicines are reviewed at length with the patient today.   The patient does not have concerns regarding medicines.  The following changes have been made:  no change  Labs/ tests ordered today include:   Orders Placed This Encounter  Procedures  . CBC with Differential/Platelet  . Basic metabolic panel  . INR/PT  . Cardiac event monitor  . EKG 12-Lead     Disposition:   FU with Kaleyah Labreck C. Oval Linsey, MD, [facility] in 1 month.    This note was written with the assistance of speech recognition software.  Please excuse any transcriptional errors.  Signed, Tamir Wallman C. Oval Linsey, MD, [facility]  05/01/2017 6:29 AM    [facility]

## 2017-05-20 ENCOUNTER — Encounter (HOSPITAL_COMMUNITY): Payer: Self-pay | Admitting: Interventional Cardiology

## 2017-05-21 DIAGNOSIS — G4733 Obstructive sleep apnea (adult) (pediatric): Secondary | ICD-10-CM | POA: Diagnosis not present

## 2017-05-27 ENCOUNTER — Telehealth: Payer: Self-pay | Admitting: *Deleted

## 2017-05-27 DIAGNOSIS — I714 Abdominal aortic aneurysm, without rupture, unspecified: Secondary | ICD-10-CM

## 2017-05-27 NOTE — Telephone Encounter (Signed)
Patient scheduled for CT of abdomen on 06/05/17 at 2:00 [facility] office. No solid foods 2 hours prior Will confirm patient is NOT taking Metformin  Left message to call back

## 2017-05-27 NOTE — Telephone Encounter (Signed)
-----   Message from Skeet Latch, MD sent at 05/20/2017 12:51 PM EDT ----- Ultrasound pictures were not helpful.  Lets get an abdominal CT-A to evaluate her AAA.

## 2017-05-27 NOTE — Telephone Encounter (Signed)
-----   Message from Skeet Latch, MD sent at 05/20/2017 12:48 PM EDT ----- Normal blood counts, kidney function and electrolytes.

## 2017-05-30 NOTE — Telephone Encounter (Signed)
Advised patient, verbalized understanding. Confirmed she is not taking Metformin. Patient did want to rescheduled f/u appointment with Dr Oval Linsey until after all of her testing done. She did rescheduled her 10 day event monitor she had missed.

## 2017-06-03 ENCOUNTER — Ambulatory Visit: Payer: BLUE CROSS/BLUE SHIELD | Admitting: Cardiovascular Disease

## 2017-06-05 ENCOUNTER — Ambulatory Visit (INDEPENDENT_AMBULATORY_CARE_PROVIDER_SITE_OTHER)
Admission: RE | Admit: 2017-06-05 | Discharge: 2017-06-05 | Disposition: A | Discharge status: Home / Self Care | Payer: BLUE CROSS/BLUE SHIELD | Source: Ambulatory Visit | Attending: Cardiovascular Disease | Admitting: Cardiovascular Disease

## 2017-06-05 DIAGNOSIS — I714 Abdominal aortic aneurysm, without rupture, unspecified: Secondary | ICD-10-CM

## 2017-06-05 DIAGNOSIS — K760 Fatty (change of) liver, not elsewhere classified: Secondary | ICD-10-CM | POA: Diagnosis not present

## 2017-06-05 MED ORDER — IOPAMIDOL (ISOVUE-370) INJECTION 76%
100.0000 mL | Freq: Once | INTRAVENOUS | Status: AC | PRN
  Administered 2017-06-05: 100 mL via INTRAVENOUS

## 2017-07-02 ENCOUNTER — Other Ambulatory Visit: Payer: Self-pay | Admitting: Physician Assistant

## 2017-07-02 DIAGNOSIS — I10 Essential (primary) hypertension: Secondary | ICD-10-CM

## 2017-07-09 ENCOUNTER — Ambulatory Visit: Payer: BLUE CROSS/BLUE SHIELD | Admitting: Cardiovascular Disease

## 2017-07-09 NOTE — Progress Notes (Deleted)
Cardiology Office Note   Date:  07/09/2017   ID:  Tamara Greer, DOB 10-21-1965, MRN [id number]  PCP:  Donella Stade, PA-C  Cardiologist:   Skeet Latch, MD   No chief complaint on file.     History of Present Illness: Tamara Greer is a 51 y.o. female with AAA, non-obstructive CAD, diabetes mellitus type 2, hypertension, hyperlipidemia,GERD, and endometrial cancer who presents for follow-up.  She was initially seen 04/2017 for an evaluation of chest pain.  For the preceding 2 weeks she reported intermittent episodes of substernal chest pain radiating into her right breast.  The symptoms were concerning for ischemia.  She refused stress testing due to a prior traumatic experience and was referred for left heart catheterization 05/2017 that revealed 25% mid RCA stenosis but was otherwise unremarkable.  LVEF was 55-65%.  LVEDP was 13 mmHg. at that appointment she also reported palpitations and was referred for an event monitor.  However this was not performed.   Past Medical History:  Diagnosis Date  . Abdominal aortic aneurysm ([place]) 10/21/2014   (CT June 2016 via [facility] contests this Dx) 3.7cm. Asymptomatic. Follow up ultrasound in 2 years.  U/S 07/2015 stable. Managed by surgeon.    . Abdominal pain, left upper quadrant 10/28/2014  . Abdominal pain, right upper quadrant 07/25/2015  . Abscess of breast, right 01/16/2017  . Abscess of right breast 01/28/2017  . Anxiety state 05/17/2014  . Bloating 07/25/2015  . BPV (benign positional vertigo) 09/30/2014  . Cancer ([place])   . Cellulitis of right breast 01/28/2017  . DDD (degenerative disc disease), cervical 08/08/2015  . Diabetes mellitus type II, controlled ([place]) 12/28/2014  . Diarrhea 07/25/2015  . Elevated liver enzymes 05/19/2014  . Endometrial cancer ([place]) 05/16/2014   2010 surgery hysterectomy. Remission.    . Esophageal reflux 05/17/2014   7 years endoscopy normal.    . Essential hypertension, benign 05/17/2014  . Fatty liver  disease, nonalcoholic 9/38/1017  . Heaviness of upper extremity 11/28/2016  . Hyperlipidemia 10/13/2015  . Insomnia 05/17/2014  . LUQ pain 12/28/2014  . Morbid obesity ([place]) 10/13/2015  . MRSA infection 01/28/2017  . Muscle spasms of head or neck 08/08/2015  . Notalgia 08/08/2015  . Pre-diabetes 10/13/2015  . Primary osteoarthritis of both knees 05/31/2014  . Right tibial fracture 05/17/2014   Repaired in 2014. Continues to walk with cane.    . Vitamin D deficiency 05/19/2014    Past Surgical History:  Procedure Laterality Date  . ABDOMINAL HYSTERECTOMY    . ORTHOPEDIC SURGERY     steel rod placed in tibia     Current Outpatient Medications  Medication Sig Dispense Refill  . acetaminophen (TYLENOL) 500 MG tablet Take 500 mg by mouth daily as needed for mild pain or headache.    . ALPRAZolam (XANAX) 0.5 MG tablet TAKE ONE TABLET BY MOUTH AT BEDTIME 30 tablet 5  . aspirin EC 81 MG tablet Take 81 mg by mouth daily.    [name] Kitchen atorvastatin (LIPITOR) 40 MG tablet TAKE ONE TABLET BY MOUTH ONCE DAILY 30 tablet 11  . hydrochlorothiazide (HYDRODIURIL) 25 MG tablet Take 1 tablet (25 mg total) by mouth daily. 90 tablet 1  . metoprolol succinate (TOPROL-XL) 25 MG 24 hr tablet Take 25 mg by mouth every evening.    . olmesartan-hydrochlorothiazide (BENICAR HCT) 40-12.5 MG tablet TAKE 1 TABLET BY MOUTH ONCE DAILY 30 tablet 0  . pantoprazole (PROTONIX) 40 MG tablet Take 1 tablet (40 mg total) by mouth  daily. 90 tablet 1   No current facility-administered medications for this visit.     Allergies:   Dexilant [dexlansoprazole]; Metformin and related; and Diclofenac    Social History:  The patient  reports that  has never smoked. she has never used smokeless tobacco. She reports that she does not drink alcohol or use drugs.   Family History:  The patient's family history includes COPD in her father; Diabetes in her mother; Heart failure in her father and mother; Stroke in her mother.    ROS:  Please see the  history of present illness.   Otherwise, review of systems are positive for none.   All other systems are reviewed and negative.    PHYSICAL EXAM: VS:  There were no vitals taken for this visit. , BMI There is no height or weight on file to calculate BMI. GENERAL:  Well appearing. Morbidly obese. HEENT:  Pupils equal round and reactive, fundi not visualized, oral mucosa unremarkable NECK:  No jugular venous distention, waveform within normal limits, carotid upstroke brisk and symmetric, no bruits  LUNGS:  Clear to auscultation bilaterally HEART:  RRR.  PMI not displaced or sustained,S1 and S2 within normal limits, no S3, no S4, no clicks, no rubs, no murmurs ABD:  Flat, positive bowel sounds normal in frequency in pitch, no bruits, no rebound, no guarding, no midline pulsatile mass, no hepatomegaly, no splenomegaly EXT:  2 plus pulses throughout, no edema, no cyanosis no clubbing SKIN:  No rashes no nodules NEURO:  Cranial nerves II through XII grossly intact, motor grossly intact throughout PSYCH:  Cognitively intact, oriented to person place and time    EKG:  EKG is ordered today. The ekg ordered today demonstrates Sinus rhythm. Rate 71 bpm.  LHC 05/19/17:   Mid RCA lesion, 25 %stenosed.  The left ventricular systolic function is normal.  LV end diastolic pressure is moderately elevated.  The left ventricular ejection fraction is 55-65% by visual estimate.  There is no aortic valve stenosis.  Recent Labs: 11/26/2016: ALT 48; TSH 3.18 05/09/2017: BUN 16; Creatinine, Ser 0.59; Hemoglobin 12.8; Platelets 202; Potassium 4.5; Sodium 136    Lipid Panel    Component Value Date/Time   CHOL 163 11/26/2016 1459   TRIG 125 11/26/2016 1459   HDL 51 11/26/2016 1459   CHOLHDL 3.2 11/26/2016 1459   VLDL 25 11/26/2016 1459   LDLCALC 87 11/26/2016 1459      Wt Readings from Last 3 Encounters:  05/19/17 136.1 kg (300 lb)  04/30/17 (!) 149.3 kg (329 lb 3.2 oz)  11/26/16 (!) 146.1 kg  (322 lb)      ASSESSMENT AND PLAN:  # Angina: Tamara Greer' chest pain is consistent with angina. She is adamant that she will never have another nuclear stres test. Given her body habitus it is unlikely that a nuclear test, stress echo or coronary CT-A would be accurate. We will proceed with left heart catheterization.  Risks and benefits of cardiac catheterization have been discussed with the patient.  The patient understands that risks included but are not limited to stroke (1 in 1000), death (1 in 74), kidney failure [usually temporary] (1 in 500), bleeding (1 in 200), allergic reaction [possibly serious] (1 in 200). The patient understands and agrees to proceed.   # Palpitations: We will get a 10 day event monitor. Check CMP, CBC, magnesium, TSH and free T4. Palp labs  # AAA: Repeat abdominal ultrasound. Her last one 05/2016 was inconclusive. Prior to that it  was 3.6 cm. Continue metoprolol, aspirin and statin. We discussed the importance of exercise, but she is limited by arthritis and morbid obesity.  # Hypertension: Continue metoprolol, HCTZ and olmesartan.  # Hyperlipidemia: Continue atorvastatin. LDL 87 10/2016.This should be increased to 80mg . Will discuss at follow up.     Current medicines are reviewed at length with the patient today.  The patient does not have concerns regarding medicines.  The following changes have been made:  no change  Labs/ tests ordered today include:   No orders of the defined types were placed in this encounter.    Disposition:   FU with Jimena Wieczorek C. Oval Linsey, MD, [facility] in 1 month.    This note was written with the assistance of speech recognition software.  Please excuse any transcriptional errors.  Signed, Jaylan Duggar C. Oval Linsey, MD, [facility]  07/09/2017 8:07 AM    [place]

## 2017-07-27 ENCOUNTER — Other Ambulatory Visit: Payer: Self-pay | Admitting: Physician Assistant

## 2017-07-27 DIAGNOSIS — I10 Essential (primary) hypertension: Secondary | ICD-10-CM

## 2017-07-28 ENCOUNTER — Other Ambulatory Visit: Payer: Self-pay | Admitting: Physician Assistant

## 2017-07-28 DIAGNOSIS — K2100 Gastro-esophageal reflux disease with esophagitis, without bleeding: Secondary | ICD-10-CM

## 2017-07-28 DIAGNOSIS — I10 Essential (primary) hypertension: Secondary | ICD-10-CM

## 2017-08-01 ENCOUNTER — Ambulatory Visit (INDEPENDENT_AMBULATORY_CARE_PROVIDER_SITE_OTHER): Payer: BLUE CROSS/BLUE SHIELD | Admitting: Cardiovascular Disease

## 2017-08-01 ENCOUNTER — Encounter: Payer: Self-pay | Admitting: Cardiovascular Disease

## 2017-08-01 VITALS — BP 118/72 | HR 84 | Ht 60.0 in

## 2017-08-01 DIAGNOSIS — Z5181 Encounter for therapeutic drug level monitoring: Secondary | ICD-10-CM | POA: Diagnosis not present

## 2017-08-01 DIAGNOSIS — I714 Abdominal aortic aneurysm, without rupture, unspecified: Secondary | ICD-10-CM

## 2017-08-01 DIAGNOSIS — R945 Abnormal results of liver function studies: Secondary | ICD-10-CM | POA: Diagnosis not present

## 2017-08-01 DIAGNOSIS — R002 Palpitations: Secondary | ICD-10-CM

## 2017-08-01 DIAGNOSIS — R7989 Other specified abnormal findings of blood chemistry: Secondary | ICD-10-CM

## 2017-08-01 DIAGNOSIS — I10 Essential (primary) hypertension: Secondary | ICD-10-CM | POA: Diagnosis not present

## 2017-08-01 DIAGNOSIS — I2510 Atherosclerotic heart disease of native coronary artery without angina pectoris: Secondary | ICD-10-CM

## 2017-08-01 NOTE — Patient Instructions (Addendum)
Medication Instructions:  Your physician recommends that you continue on your current medications as directed. Please refer to the Current Medication list given to you today.  Labwork: CMET NEXT WEEK AT YOUR PRIMARY CARE   Testing/Procedures: NONE   Follow-Up: Your physician wants you to follow-up in: [place] will receive a reminder letter in the mail two months in advance. If you don't receive a letter, please call our office to schedule the follow-up appointment.  If you need a refill on your cardiac medications before your next appointment, please call your pharmacy.

## 2017-08-01 NOTE — Progress Notes (Signed)
Cardiology Office Note   Date:  08/01/2017   ID:  Tamara Greer, DOB 10/11/1965, MRN [id number]  PCP:  Donella Stade, PA-C  Cardiologist:   Skeet Latch, MD   No chief complaint on file.   History of Present Illness: Tamara Greer is a 51 y.o. female with AAA, non-obstructive CAD, diabetes mellitus type 2, hypertension, hyperlipidemia,GERD, and endometrial cancer who presents for follow-up.  She was initially seen 04/2017 for an evaluation of chest pain.  For the preceding 2 weeks she reported intermittent episodes of substernal chest pain radiating into her right breast.  The symptoms were concerning for ischemia.  She refused stress testing due to a prior traumatic experience and was referred for left heart catheterization 05/2017 that revealed 25% mid RCA stenosis but was otherwise unremarkable.  LVEF was 55-65%.  LVEDP was 13 mmHg.  At that appointment she also reported palpitations and was referred for an event monitor.  However this was not performed because her mother   Since her last appointment Tamara Greer has been feeling well physically.  She has no chest pain and has stable shortness of breath that she attributes to her weight.  Her palpitations have been much better lately.  She hasn't been getting much exercise.  She doesn't check her blood pressure at home often.  However, she thinks it is elevated today because of rushing to get to her appointment on time and traffic.  She gets very anxious when driving.  Tamara Greer were last checked 10/2016 and were elevated.  She was asked to have them repeated but has not done that yet.  She typically does not use much Tylenol and never drinks alcohol.  Past Medical History:  Diagnosis Date  . Abdominal aortic aneurysm ([place]) 10/21/2014   (CT June 2016 via [facility] contests this Dx) 3.7cm. Asymptomatic. Follow up ultrasound in 2 years.  U/S 07/2015 stable. Managed by surgeon.    . Abdominal pain, left upper quadrant 10/28/2014  .  Abdominal pain, right upper quadrant 07/25/2015  . Abscess of breast, right 01/16/2017  . Abscess of right breast 01/28/2017  . Anxiety state 05/17/2014  . Bloating 07/25/2015  . BPV (benign positional vertigo) 09/30/2014  . Cancer ([place])   . Cellulitis of right breast 01/28/2017  . DDD (degenerative disc disease), cervical 08/08/2015  . Diabetes mellitus type II, controlled ([place]) 12/28/2014  . Diarrhea 07/25/2015  . Elevated liver enzymes 05/19/2014  . Endometrial cancer ([place]) 05/16/2014   2010 surgery hysterectomy. Remission.    . Esophageal reflux 05/17/2014   7 years endoscopy normal.    . Essential hypertension, benign 05/17/2014  . Fatty liver disease, nonalcoholic 7/85/8850  . Heaviness of upper extremity 11/28/2016  . Hyperlipidemia 10/13/2015  . Insomnia 05/17/2014  . LUQ pain 12/28/2014  . Morbid obesity ([place]) 10/13/2015  . MRSA infection 01/28/2017  . Muscle spasms of head or neck 08/08/2015  . Notalgia 08/08/2015  . Pre-diabetes 10/13/2015  . Primary osteoarthritis of both knees 05/31/2014  . Right tibial fracture 05/17/2014   Repaired in 2014. Continues to walk with cane.    . Vitamin D deficiency 05/19/2014    Past Surgical History:  Procedure Laterality Date  . ABDOMINAL HYSTERECTOMY    . LEFT HEART CATH AND CORONARY ANGIOGRAPHY N/A 05/19/2017   Procedure: LEFT HEART CATH AND CORONARY ANGIOGRAPHY;  Surgeon: Jettie Booze, MD;  Location: [facility];  Service: Cardiovascular;  Laterality: N/A;  . ORTHOPEDIC SURGERY     steel  rod placed in tibia     Current Outpatient Medications  Medication Sig Dispense Refill  . acetaminophen (TYLENOL) 500 MG tablet Take 500 mg by mouth daily as needed for mild pain or headache.    . ALPRAZolam (XANAX) 0.5 MG tablet TAKE ONE TABLET BY MOUTH AT BEDTIME 30 tablet 5  . aspirin EC 81 MG tablet Take 81 mg by mouth daily.    [name] Kitchen atorvastatin (LIPITOR) 40 MG tablet TAKE ONE TABLET BY MOUTH ONCE DAILY 30 tablet 11  . hydrochlorothiazide  (HYDRODIURIL) 25 MG tablet TAKE ONE TABLET BY MOUTH ONCE DAILY 30 tablet 5  . metoprolol succinate (TOPROL-XL) 25 MG 24 hr tablet Take 25 mg by mouth daily as needed.     [name] Kitchen olmesartan-hydrochlorothiazide (BENICAR HCT) 40-12.5 MG tablet TAKE 1 TABLET BY MOUTH ONCE DAILY 30 tablet 0  . pantoprazole (PROTONIX) 40 MG tablet TAKE ONE TABLET BY MOUTH ONCE DAILY 30 tablet 5   No current facility-administered medications for this visit.     Allergies:   Dexlansoprazole; Metformin; Metformin and related; and Diclofenac    Social History:  The patient  reports that  has never smoked. she has never used smokeless tobacco. She reports that she does not drink alcohol or use drugs.   Family History:  The patient's family history includes COPD in her father; Diabetes in her mother; Heart failure in her father and mother; Stroke in her mother.    ROS:  Please see the history of present illness.   Otherwise, review of systems are positive for none.   All other systems are reviewed and negative.    PHYSICAL EXAM: VS:  BP 118/72   Pulse 84   Ht 5' (1.524 m)   BMI 58.59 kg/m  , BMI Body mass index is 58.59 kg/m. GENERAL:  Well appearing HEENT: Pupils equal round and reactive, fundi not visualized, oral mucosa unremarkable NECK:  No jugular venous distention, waveform within normal limits, carotid upstroke brisk and symmetric, no bruits, no thyromegaly LYMPHATICS:  No cervical adenopathy LUNGS:  Clear to auscultation bilaterally HEART:  RRR.  PMI not displaced or sustained,S1 and S2 within normal limits, no S3, no S4, no clicks, no rubs, no murmurs ABD:  Flat, positive bowel sounds normal in frequency in pitch, no bruits, no rebound, no guarding, no midline pulsatile mass, no hepatomegaly, no splenomegaly EXT:  2 plus pulses throughout, no edema, no cyanosis no clubbing SKIN:  No rashes no nodules NEURO:  Cranial nerves II through XII grossly intact, motor grossly intact throughout PSYCH:  Cognitively  intact, oriented to person place and time   EKG:  EKG is ordered today. The ekg ordered today demonstrates Sinus rhythm. Rate 71 bpm.  LHC 05/19/17:   Mid RCA lesion, 25 %stenosed.  The left ventricular systolic function is normal.  LV end diastolic pressure is moderately elevated.  The left ventricular ejection fraction is 55-65% by visual estimate.  There is no aortic valve stenosis.  Recent Labs: 11/26/2016: ALT 48; TSH 3.18 05/09/2017: BUN 16; Creatinine, Ser 0.59; Hemoglobin 12.8; Platelets 202; Potassium 4.5; Sodium 136    Lipid Panel    Component Value Date/Time   CHOL 163 11/26/2016 1459   TRIG 125 11/26/2016 1459   HDL 51 11/26/2016 1459   CHOLHDL 3.2 11/26/2016 1459   VLDL 25 11/26/2016 1459   LDLCALC 87 11/26/2016 1459      Wt Readings from Last 3 Encounters:  05/19/17 300 lb (136.1 kg)  04/30/17 (!) 329 lb 3.2  oz (149.3 kg)  11/26/16 (!) 322 lb (146.1 kg)      ASSESSMENT AND PLAN:  # Non-obstructive CAD: : Non-obstructive CAD on cath.  Continue aspirin.  We will repeat her Greer today as they were previously elevated.  Continue atorvastatin for now.  Fatty liver noted on CT. Continue metoprolol.  # Palpitations: Symptoms have been better lately.  We will not get an event monitor at this time.  She will call if the change.  # AAA: No AAA on CT-a abdomen 06/2017.  # Hypertension: Blood pressure was initially elevated but within normal limits on repeat.  Continue metoprolol, HCTZ and olmesartan.  # Hyperlipidemia: Continue atorvastatin. LDL 87 10/2016.  Repeat Greer as above.     Current medicines are reviewed at length with the patient today.  The patient does not have concerns regarding medicines.  The following changes have been made:  no change  Labs/ tests ordered today include:   Orders Placed This Encounter  Procedures  . Comprehensive metabolic panel     Disposition:   FU with Smitty Ackerley C. Oval Linsey, MD, [facility] in 1 year.    This note was  written with the assistance of speech recognition software.  Please excuse any transcriptional errors.  Signed, Alyne Martinson C. Oval Linsey, MD, [facility]  08/01/2017 12:56 PM    [place]

## 2017-08-07 ENCOUNTER — Other Ambulatory Visit: Payer: Self-pay | Admitting: Physician Assistant

## 2017-08-07 DIAGNOSIS — I10 Essential (primary) hypertension: Secondary | ICD-10-CM

## 2017-08-20 DIAGNOSIS — G4733 Obstructive sleep apnea (adult) (pediatric): Secondary | ICD-10-CM | POA: Diagnosis not present

## 2017-08-22 DIAGNOSIS — H66002 Acute suppurative otitis media without spontaneous rupture of ear drum, left ear: Secondary | ICD-10-CM | POA: Diagnosis not present

## 2017-08-22 DIAGNOSIS — J0140 Acute pansinusitis, unspecified: Secondary | ICD-10-CM | POA: Diagnosis not present

## 2017-09-02 ENCOUNTER — Other Ambulatory Visit: Payer: Self-pay | Admitting: Physician Assistant

## 2017-09-04 ENCOUNTER — Other Ambulatory Visit: Payer: Self-pay | Admitting: Physician Assistant

## 2017-09-04 ENCOUNTER — Telehealth: Payer: Self-pay | Admitting: *Deleted

## 2017-09-04 MED ORDER — ALPRAZOLAM 0.5 MG PO TABS: 0.5000 mg | ORAL_TABLET | Freq: Every day | ORAL | 1 refills | Status: DC

## 2017-09-04 NOTE — Telephone Encounter (Signed)
Pt notified of rx. 

## 2017-09-04 NOTE — Telephone Encounter (Signed)
Sent!

## 2017-09-04 NOTE — Telephone Encounter (Signed)
Pt left vm requesting a refill on her Xanax.  It looks like Suanne Marker may have already pended it and sent it to you.

## 2017-09-05 ENCOUNTER — Other Ambulatory Visit: Payer: Self-pay | Admitting: Physician Assistant

## 2017-09-05 DIAGNOSIS — I10 Essential (primary) hypertension: Secondary | ICD-10-CM

## 2017-09-15 DIAGNOSIS — R1030 Lower abdominal pain, unspecified: Secondary | ICD-10-CM | POA: Diagnosis not present

## 2017-09-15 DIAGNOSIS — K625 Hemorrhage of anus and rectum: Secondary | ICD-10-CM | POA: Diagnosis not present

## 2017-09-15 DIAGNOSIS — K589 Irritable bowel syndrome without diarrhea: Secondary | ICD-10-CM | POA: Diagnosis not present

## 2017-09-15 DIAGNOSIS — R197 Diarrhea, unspecified: Secondary | ICD-10-CM | POA: Diagnosis not present

## 2017-10-08 DIAGNOSIS — R109 Unspecified abdominal pain: Secondary | ICD-10-CM | POA: Diagnosis not present

## 2017-10-08 DIAGNOSIS — K219 Gastro-esophageal reflux disease without esophagitis: Secondary | ICD-10-CM | POA: Diagnosis not present

## 2017-10-08 DIAGNOSIS — R197 Diarrhea, unspecified: Secondary | ICD-10-CM | POA: Diagnosis not present

## 2017-10-08 DIAGNOSIS — K625 Hemorrhage of anus and rectum: Secondary | ICD-10-CM | POA: Diagnosis not present

## 2017-10-14 DIAGNOSIS — G4730 Sleep apnea, unspecified: Secondary | ICD-10-CM | POA: Diagnosis not present

## 2017-10-14 DIAGNOSIS — K589 Irritable bowel syndrome without diarrhea: Secondary | ICD-10-CM | POA: Diagnosis not present

## 2017-10-14 DIAGNOSIS — R109 Unspecified abdominal pain: Secondary | ICD-10-CM | POA: Diagnosis not present

## 2017-10-14 DIAGNOSIS — I10 Essential (primary) hypertension: Secondary | ICD-10-CM | POA: Diagnosis not present

## 2017-10-14 DIAGNOSIS — K641 Second degree hemorrhoids: Secondary | ICD-10-CM | POA: Diagnosis not present

## 2017-10-14 DIAGNOSIS — F329 Major depressive disorder, single episode, unspecified: Secondary | ICD-10-CM | POA: Diagnosis not present

## 2017-10-14 DIAGNOSIS — M170 Bilateral primary osteoarthritis of knee: Secondary | ICD-10-CM | POA: Diagnosis not present

## 2017-10-14 DIAGNOSIS — F419 Anxiety disorder, unspecified: Secondary | ICD-10-CM | POA: Diagnosis not present

## 2017-10-14 DIAGNOSIS — Z7982 Long term (current) use of aspirin: Secondary | ICD-10-CM | POA: Diagnosis not present

## 2017-10-14 DIAGNOSIS — K219 Gastro-esophageal reflux disease without esophagitis: Secondary | ICD-10-CM | POA: Diagnosis not present

## 2017-10-14 DIAGNOSIS — R197 Diarrhea, unspecified: Secondary | ICD-10-CM | POA: Diagnosis not present

## 2017-10-14 DIAGNOSIS — E11618 Type 2 diabetes mellitus with other diabetic arthropathy: Secondary | ICD-10-CM | POA: Diagnosis not present

## 2017-10-14 DIAGNOSIS — Z79899 Other long term (current) drug therapy: Secondary | ICD-10-CM | POA: Diagnosis not present

## 2017-10-14 DIAGNOSIS — D120 Benign neoplasm of cecum: Secondary | ICD-10-CM | POA: Diagnosis not present

## 2017-10-14 DIAGNOSIS — Z888 Allergy status to other drugs, medicaments and biological substances status: Secondary | ICD-10-CM | POA: Diagnosis not present

## 2017-10-14 DIAGNOSIS — Z6841 Body Mass Index (BMI) 40.0 and over, adult: Secondary | ICD-10-CM | POA: Diagnosis not present

## 2017-10-14 DIAGNOSIS — K625 Hemorrhage of anus and rectum: Secondary | ICD-10-CM | POA: Diagnosis not present

## 2017-10-14 DIAGNOSIS — K2970 Gastritis, unspecified, without bleeding: Secondary | ICD-10-CM | POA: Diagnosis not present

## 2017-10-14 DIAGNOSIS — E785 Hyperlipidemia, unspecified: Secondary | ICD-10-CM | POA: Diagnosis not present

## 2017-10-14 DIAGNOSIS — K2950 Unspecified chronic gastritis without bleeding: Secondary | ICD-10-CM | POA: Diagnosis not present

## 2017-10-14 DIAGNOSIS — K648 Other hemorrhoids: Secondary | ICD-10-CM | POA: Diagnosis not present

## 2017-10-16 ENCOUNTER — Other Ambulatory Visit: Payer: Self-pay | Admitting: Physician Assistant

## 2017-10-16 DIAGNOSIS — I10 Essential (primary) hypertension: Secondary | ICD-10-CM

## 2017-10-21 ENCOUNTER — Encounter: Payer: Self-pay | Admitting: Physician Assistant

## 2017-10-21 ENCOUNTER — Ambulatory Visit (INDEPENDENT_AMBULATORY_CARE_PROVIDER_SITE_OTHER): Payer: BLUE CROSS/BLUE SHIELD | Admitting: Physician Assistant

## 2017-10-21 VITALS — BP 120/64 | HR 76 | Ht 60.0 in | Wt 315.0 lb

## 2017-10-21 DIAGNOSIS — I10 Essential (primary) hypertension: Secondary | ICD-10-CM | POA: Diagnosis not present

## 2017-10-21 DIAGNOSIS — F419 Anxiety disorder, unspecified: Secondary | ICD-10-CM

## 2017-10-21 DIAGNOSIS — R748 Abnormal levels of other serum enzymes: Secondary | ICD-10-CM

## 2017-10-21 DIAGNOSIS — E559 Vitamin D deficiency, unspecified: Secondary | ICD-10-CM | POA: Diagnosis not present

## 2017-10-21 DIAGNOSIS — E1165 Type 2 diabetes mellitus with hyperglycemia: Secondary | ICD-10-CM | POA: Diagnosis not present

## 2017-10-21 DIAGNOSIS — E782 Mixed hyperlipidemia: Secondary | ICD-10-CM

## 2017-10-21 DIAGNOSIS — R5383 Other fatigue: Secondary | ICD-10-CM | POA: Diagnosis not present

## 2017-10-21 LAB — POCT GLYCOSYLATED HEMOGLOBIN (HGB A1C): Hemoglobin A1C: 9.8

## 2017-10-21 MED ORDER — TELMISARTAN-HCTZ 40-12.5 MG PO TABS: 1.0000 | ORAL_TABLET | Freq: Every day | ORAL | 1 refills | Status: DC

## 2017-10-21 MED ORDER — SEMAGLUTIDE(0.25 OR 0.5MG/DOS) 2 MG/1.5ML ~~LOC~~ SOPN: PEN_INJECTOR | SUBCUTANEOUS | 2 refills | Status: AC

## 2017-10-21 MED ORDER — ALPRAZOLAM 0.5 MG PO TABS: 0.5000 mg | ORAL_TABLET | Freq: Every day | ORAL | 5 refills | Status: DC

## 2017-10-21 MED ORDER — DAPAGLIFLOZIN PRO-METFORMIN ER 10-1000 MG PO TB24: 1.0000 | ORAL_TABLET | Freq: Every day | ORAL | 2 refills | Status: DC

## 2017-10-21 NOTE — Progress Notes (Addendum)
Subjective:    Patient ID: Tamara Greer, female    DOB: [birth year], 52 y.o.   MRN: [id number]  HPI  Pt is a very pleasant morbidly obese 52 yo female DM, stable AAA, hyperlipidemia, HTN who presents for follow up.   HTN- benicar is not available right now and on backorder. Needs another medication. Doing well. No CP, palpitations, headaches or vision changes.   DM-not checking sugars. Not on any medications. Last year a1C was 6.4 but patient declined metformin. No hypoglycemia. No open sores or wounds. Never had pancreatitis.   AAA-monitoring BP and hyperlipidemia. Pt on ASA and lipitor.   Recent elevation of liver enzymes. Hx of this but they had improved. On STATIn but has been for years. Takes prn tylenol. Drinks alcohol occasionally. Has put on more weight.   She wishes she had more energy. She has not been eating right or exercising. She has had some deaths in family and a lot going on . No SI or HC thoghts.   .. Active Ambulatory Problems    Diagnosis Date Noted  . Endometrial cancer ([place]) 05/16/2014  . Essential hypertension, benign 05/17/2014  . Insomnia 05/17/2014  . Anxiety state 05/17/2014  . Right tibial fracture 05/17/2014  . Esophageal reflux 05/17/2014  . Vitamin D deficiency 05/19/2014  . Elevated liver enzymes 05/19/2014  . Primary osteoarthritis of both knees 05/31/2014  . BPV (benign positional vertigo) 09/30/2014  . Fatty liver disease, nonalcoholic 52/77/8242  . Abdominal aortic aneurysm ([place]) 10/21/2014  . Abdominal pain, left upper quadrant 10/28/2014  . Diabetes mellitus type II, controlled ([place]) 12/28/2014  . LUQ pain 12/28/2014  . Abdominal pain, right upper quadrant 07/25/2015  . Diarrhea 07/25/2015  . Bloating 07/25/2015  . DDD (degenerative disc disease), cervical 08/08/2015  . Muscle spasms of head or neck 08/08/2015  . Notalgia 08/08/2015  . Morbid obesity ([place]) 10/13/2015  . Pre-diabetes 10/13/2015  . Hyperlipidemia 10/13/2015  .  Heaviness of upper extremity 11/28/2016  . Abscess of breast, right 01/16/2017  . Cellulitis of right breast 01/28/2017  . Abscess of right breast 01/28/2017  . MRSA infection 01/28/2017  . Angina pectoris [facility])    Resolved Ambulatory Problems    Diagnosis Date Noted  . No Resolved Ambulatory Problems   Past Medical History:  Diagnosis Date  . Abdominal aortic aneurysm ([place]) 10/21/2014  . Abdominal pain, left upper quadrant 10/28/2014  . Abdominal pain, right upper quadrant 07/25/2015  . Abscess of breast, right 01/16/2017  . Abscess of right breast 01/28/2017  . Anxiety state 05/17/2014  . Bloating 07/25/2015  . BPV (benign positional vertigo) 09/30/2014  . Cancer ([place])   . Cellulitis of right breast 01/28/2017  . DDD (degenerative disc disease), cervical 08/08/2015  . Diabetes mellitus type II, controlled ([place]) 12/28/2014  . Diarrhea 07/25/2015  . Elevated liver enzymes 05/19/2014  . Endometrial cancer ([place]) 05/16/2014  . Esophageal reflux 05/17/2014  . Essential hypertension, benign 05/17/2014  . Fatty liver disease, nonalcoholic 3/53/6144  . Heaviness of upper extremity 11/28/2016  . Hyperlipidemia 10/13/2015  . Insomnia 05/17/2014  . LUQ pain 12/28/2014  . Morbid obesity ([place]) 10/13/2015  . MRSA infection 01/28/2017  . Muscle spasms of head or neck 08/08/2015  . Notalgia 08/08/2015  . Pre-diabetes 10/13/2015  . Primary osteoarthritis of both knees 05/31/2014  . Right tibial fracture 05/17/2014  . Vitamin D deficiency 05/19/2014     Review of Systems  All other systems reviewed and are negative.  Objective:   Physical Exam  Constitutional: She is oriented to person, place, and time. She appears well-developed and well-nourished.  Obese.   HENT:  Head: Normocephalic and atraumatic.  Cardiovascular: Normal rate, regular rhythm and normal heart sounds.  Pulmonary/Chest: Effort normal and breath sounds normal.  Neurological: She is alert and oriented to person, place, and time.   Psychiatric: She has a normal mood and affect. Her behavior is normal.          Assessment & Plan:  [name] KitchenMarland KitchenCorleen was seen today for diabetes and hypertension.  Diagnoses and all orders for this visit:  Elevated liver enzymes -     COMPLETE METABOLIC PANEL WITH GFR  Mixed hyperlipidemia -     Lipid Panel w/reflex Direct LDL  No energy -     B12 and Folate Panel -     TSH -     CBC with Differential/Platelet  Vitamin D deficiency -     Vitamin D 1,25 dihydroxy  Uncontrolled type 2 diabetes mellitus with hyperglycemia (HCC) -     Semaglutide (OZEMPIC) 0.25 or 0.5 MG/DOSE SOPN; Inject 0.25 mg into the skin once a week for 30 days, THEN 0.5 mg once a week. -     Dapagliflozin-Metformin HCl ER (XIGDUO XR) 06-999 MG TB24; Take 1 tablet by mouth daily. -     POCT HgB A1C  Anxiety -     ALPRAZolam (XANAX) 0.5 MG tablet; Take 1 tablet (0.5 mg total) by mouth at bedtime.  Essential hypertension -     telmisartan-hydrochlorothiazide (MICARDIS HCT) 40-12.5 MG tablet; Take 1 tablet by mouth daily.  Morbid obesity ([place])    Lab Results  Component Value Date   HGBA1C 9.8 10/21/2017   .[name] Kitchen Depression screen PHQ 2/9 10/21/2017  Decreased Interest 2  Down, Depressed, Hopeless 1  PHQ - 2 Score 3  Altered sleeping 2  Tired, decreased energy 2  Change in appetite 0  Feeling bad or failure about yourself  0  Trouble concentrating 0  Moving slowly or fidgety/restless 0  Suicidal thoughts 0  PHQ-9 Score 7  Difficult doing work/chores Not difficult at all   .[name] Kitchen GAD 7 : Generalized Anxiety Score 10/21/2017  Nervous, Anxious, on Edge 1  Control/stop worrying 0  Worry too much - different things 0  Trouble relaxing 0  Restless 0  Easily annoyed or irritable 1  Afraid - awful might happen 1  Total GAD 7 Score 3  Anxiety Difficulty Not difficult at all   Will do some labs.   Start ozempic and xigduo. Given coupon cards. Discussed side effects and how to use. I am hoping that  weight loss will be a welcomed side effect.   Discussed how to use ozempic pen. Pt has never had pancreatitis.   [name] Kitchen.Discussed low carb diet with 1500 calories and 80g of protein.  Exercising at least 150 minutes a week.  [employer] could be a Microbiologist.   Discussed need for eye exam, pneumonia vaccine. Pt declines today.   Changed benicar/hct to micardis/hct. Recheck BP in 2 weeks.   Suspect liver enzymes are due to fatty liver. We are going to work on this.   Xanax refill given for as needed anxiety. Discussed abuse potential. Use sparingly.   Follow up in 3 months.   [name] Kitchen.Spent 40 minutes with patient and greater than 50 percent of visit spent counseling patient regarding treatment plan.

## 2017-10-23 ENCOUNTER — Encounter: Payer: Self-pay | Admitting: Physician Assistant

## 2017-10-23 DIAGNOSIS — R5383 Other fatigue: Secondary | ICD-10-CM | POA: Insufficient documentation

## 2017-10-23 NOTE — Addendum Note (Signed)
Addended by: Donella Stade on: 10/23/2017 10:34 PM   Modules accepted: Level of Service

## 2017-10-29 ENCOUNTER — Telehealth: Payer: Self-pay | Admitting: *Deleted

## 2017-10-29 MED ORDER — AMBULATORY NON FORMULARY MEDICATION: 2 refills | Status: AC

## 2017-11-06 NOTE — Telephone Encounter (Signed)
Rx for glucometer ordered.

## 2017-11-07 ENCOUNTER — Ambulatory Visit: Payer: BLUE CROSS/BLUE SHIELD | Admitting: Physician Assistant

## 2017-11-07 DIAGNOSIS — K219 Gastro-esophageal reflux disease without esophagitis: Secondary | ICD-10-CM | POA: Diagnosis not present

## 2017-11-07 DIAGNOSIS — R109 Unspecified abdominal pain: Secondary | ICD-10-CM | POA: Diagnosis not present

## 2017-11-07 DIAGNOSIS — F419 Anxiety disorder, unspecified: Secondary | ICD-10-CM | POA: Diagnosis not present

## 2017-11-07 DIAGNOSIS — R1013 Epigastric pain: Secondary | ICD-10-CM | POA: Diagnosis not present

## 2017-11-07 DIAGNOSIS — Z888 Allergy status to other drugs, medicaments and biological substances status: Secondary | ICD-10-CM | POA: Diagnosis not present

## 2017-11-07 DIAGNOSIS — R197 Diarrhea, unspecified: Secondary | ICD-10-CM | POA: Diagnosis not present

## 2017-11-07 DIAGNOSIS — I10 Essential (primary) hypertension: Secondary | ICD-10-CM | POA: Diagnosis not present

## 2017-11-07 DIAGNOSIS — E119 Type 2 diabetes mellitus without complications: Secondary | ICD-10-CM | POA: Diagnosis not present

## 2017-11-07 DIAGNOSIS — K760 Fatty (change of) liver, not elsewhere classified: Secondary | ICD-10-CM | POA: Diagnosis not present

## 2017-11-07 DIAGNOSIS — Z7982 Long term (current) use of aspirin: Secondary | ICD-10-CM | POA: Diagnosis not present

## 2017-11-07 DIAGNOSIS — Z79899 Other long term (current) drug therapy: Secondary | ICD-10-CM | POA: Diagnosis not present

## 2017-11-07 DIAGNOSIS — R112 Nausea with vomiting, unspecified: Secondary | ICD-10-CM | POA: Diagnosis not present

## 2017-11-07 DIAGNOSIS — Z881 Allergy status to other antibiotic agents status: Secondary | ICD-10-CM | POA: Diagnosis not present

## 2017-11-07 DIAGNOSIS — K529 Noninfective gastroenteritis and colitis, unspecified: Secondary | ICD-10-CM | POA: Diagnosis not present

## 2017-11-07 DIAGNOSIS — G4730 Sleep apnea, unspecified: Secondary | ICD-10-CM | POA: Diagnosis not present

## 2017-11-07 DIAGNOSIS — Z9989 Dependence on other enabling machines and devices: Secondary | ICD-10-CM | POA: Diagnosis not present

## 2017-11-07 DIAGNOSIS — F329 Major depressive disorder, single episode, unspecified: Secondary | ICD-10-CM | POA: Diagnosis not present

## 2017-11-19 DIAGNOSIS — G4733 Obstructive sleep apnea (adult) (pediatric): Secondary | ICD-10-CM | POA: Diagnosis not present

## 2017-11-21 ENCOUNTER — Encounter: Payer: Self-pay | Admitting: Physician Assistant

## 2017-11-21 ENCOUNTER — Ambulatory Visit: Payer: BLUE CROSS/BLUE SHIELD | Admitting: Physician Assistant

## 2017-11-21 DIAGNOSIS — E1165 Type 2 diabetes mellitus with hyperglycemia: Secondary | ICD-10-CM | POA: Diagnosis not present

## 2017-11-21 DIAGNOSIS — T887XXA Unspecified adverse effect of drug or medicament, initial encounter: Secondary | ICD-10-CM | POA: Diagnosis not present

## 2017-11-21 DIAGNOSIS — I10 Essential (primary) hypertension: Secondary | ICD-10-CM | POA: Diagnosis not present

## 2017-11-21 DIAGNOSIS — I714 Abdominal aortic aneurysm, without rupture, unspecified: Secondary | ICD-10-CM

## 2017-11-21 MED ORDER — ONDANSETRON HCL 8 MG PO TABS: 8.0000 mg | ORAL_TABLET | Freq: Three times a day (TID) | ORAL | 1 refills | Status: DC | PRN

## 2017-11-21 MED ORDER — DAPAGLIFLOZIN PROPANEDIOL 10 MG PO TABS: 10.0000 mg | ORAL_TABLET | Freq: Every day | ORAL | 1 refills | Status: DC

## 2017-11-21 NOTE — Progress Notes (Signed)
Subjective:    Patient ID: Tamara Greer, female    DOB: Aug 16, 1966, 52 y.o.   MRN: [id number]  HPI  Pt is a 52 yo morbidly obese female with Type II DM, CAD, AAA, HTN who presents to the clinic to follow up on medications.   Pt is taking xigduo and makes her vomit and very nauseated. She has had this reaction with all metformin. She would like to switch. She continues to take ozempic but it also makes her nauseated but manageable. She is eating much less. Her sugars have really started to drop. 353 when she first started and 137 this am. She has lost 8lbs and really motivated to keep losing. She has just bought a membership to [employer] and excited about exercising.    Pt does question if she should stay on ASA daily after new study out.   .. Active Ambulatory Problems    Diagnosis Date Noted  . Endometrial cancer ([place]) 05/16/2014  . Essential hypertension, benign 05/17/2014  . Insomnia 05/17/2014  . Anxiety 05/17/2014  . Right tibial fracture 05/17/2014  . Esophageal reflux 05/17/2014  . Vitamin D deficiency 05/19/2014  . Elevated liver enzymes 05/19/2014  . Primary osteoarthritis of both knees 05/31/2014  . BPV (benign positional vertigo) 09/30/2014  . Fatty liver disease, nonalcoholic 32/20/2542  . Abdominal aortic aneurysm ([place]) 10/21/2014  . Abdominal pain, left upper quadrant 10/28/2014  . LUQ pain 12/28/2014  . Abdominal pain, right upper quadrant 07/25/2015  . Diarrhea 07/25/2015  . Bloating 07/25/2015  . DDD (degenerative disc disease), cervical 08/08/2015  . Muscle spasms of head or neck 08/08/2015  . Notalgia 08/08/2015  . Morbid obesity ([place]) 10/13/2015  . Pre-diabetes 10/13/2015  . Hyperlipidemia 10/13/2015  . Heaviness of upper extremity 11/28/2016  . Abscess of breast, right 01/16/2017  . Cellulitis of right breast 01/28/2017  . Abscess of right breast 01/28/2017  . MRSA infection 01/28/2017  . Angina pectoris ([place])   . No energy 10/23/2017  .  Uncontrolled type 2 diabetes mellitus with hyperglycemia ([place]) 11/21/2017  . Medication side effect 11/21/2017   Resolved Ambulatory Problems    Diagnosis Date Noted  . Diabetes mellitus type II, controlled ([place]) 12/28/2014   Past Medical History:  Diagnosis Date  . Abdominal aortic aneurysm ([place]) 10/21/2014  . Abdominal pain, left upper quadrant 10/28/2014  . Abdominal pain, right upper quadrant 07/25/2015  . Abscess of breast, right 01/16/2017  . Abscess of right breast 01/28/2017  . Anxiety state 05/17/2014  . Bloating 07/25/2015  . BPV (benign positional vertigo) 09/30/2014  . Cancer ([place])   . Cellulitis of right breast 01/28/2017  . DDD (degenerative disc disease), cervical 08/08/2015  . Diabetes mellitus type II, controlled ([place]) 12/28/2014  . Diarrhea 07/25/2015  . Elevated liver enzymes 05/19/2014  . Endometrial cancer ([place]) 05/16/2014  . Esophageal reflux 05/17/2014  . Essential hypertension, benign 05/17/2014  . Fatty liver disease, nonalcoholic 03/08/2375  . Heaviness of upper extremity 11/28/2016  . Hyperlipidemia 10/13/2015  . Insomnia 05/17/2014  . LUQ pain 12/28/2014  . Morbid obesity ([place]) 10/13/2015  . MRSA infection 01/28/2017  . Muscle spasms of head or neck 08/08/2015  . Notalgia 08/08/2015  . Pre-diabetes 10/13/2015  . Primary osteoarthritis of both knees 05/31/2014  . Right tibial fracture 05/17/2014  . Vitamin D deficiency 05/19/2014      Review of Systems  All other systems reviewed and are negative.      Objective:   Physical Exam  Constitutional: She  is oriented to person, place, and time. She appears well-developed and well-nourished.  Morbid obesity.   HENT:  Head: Normocephalic and atraumatic.  Cardiovascular: Normal rate, regular rhythm and normal heart sounds.  Pulmonary/Chest: Effort normal and breath sounds normal. She has no wheezes.  Neurological: She is alert and oriented to person, place, and time.  Psychiatric: She has a normal mood and affect. Her  behavior is normal.          Assessment & Plan:  [name] KitchenMarland KitchenDiagnoses and all orders for this visit:  Morbid obesity ([place])  Uncontrolled type 2 diabetes mellitus with hyperglycemia ([place]) -     dapagliflozin propanediol (FARXIGA) 10 MG TABS tablet; Take 10 mg by mouth daily.  Medication side effect -     ondansetron (ZOFRAN) 8 MG tablet; Take 1 tablet (8 mg total) by mouth every 8 (eight) hours as needed for nausea or vomiting.  Essential hypertension, benign  Abdominal aortic aneurysm (AAA) without rupture (HCC)   Pt is down 8lbs. Way to go. Continue with diet and exercise changes.   Stop all metformin products. Start farxiga. Increase to ozempic weekly .5mg . Follow up in 2 months. Use zofran as needed. If cannot tolerate stay on .25mg  weekly dose.   Recommended to stay on ASA due to CAD, AAA, DM. She denies any GI complaints, epigastric pain etc to prelude a GI bleed. Call with any changes.   BP looks good but not to goal for DM of 130/90. Will continue to monitor.

## 2017-12-24 ENCOUNTER — Ambulatory Visit (INDEPENDENT_AMBULATORY_CARE_PROVIDER_SITE_OTHER): Payer: BLUE CROSS/BLUE SHIELD | Admitting: Physician Assistant

## 2017-12-24 ENCOUNTER — Encounter: Payer: Self-pay | Admitting: Physician Assistant

## 2017-12-24 VITALS — BP 105/64 | HR 94 | Temp 98.1°F | Resp 20 | Ht 60.0 in | Wt 310.0 lb

## 2017-12-24 DIAGNOSIS — R300 Dysuria: Secondary | ICD-10-CM

## 2017-12-24 LAB — POCT URINALYSIS DIPSTICK
BILIRUBIN UA: NEGATIVE
Ketones, UA: NEGATIVE
Nitrite, UA: POSITIVE
PH UA: 6 (ref 5.0–8.0)
SPEC GRAV UA: 1.02 (ref 1.010–1.025)
UROBILINOGEN UA: 1 U/dL

## 2017-12-24 MED ORDER — NITROFURANTOIN MONOHYD MACRO 100 MG PO CAPS: 100.0000 mg | ORAL_CAPSULE | Freq: Two times a day (BID) | ORAL | 0 refills | Status: AC

## 2017-12-24 NOTE — Patient Instructions (Signed)

## 2017-12-24 NOTE — Progress Notes (Signed)
HPI:                                                                Tamara Greer is a 52 y.o. female who presents to [facility] today for dysuria  Dysuria   This is a new problem. The current episode started in the past 7 days. The problem occurs every urination. The problem has been unchanged. The quality of the pain is described as burning. The pain is moderate. There has been no fever. She is sexually active. There is no history of pyelonephritis. Associated symptoms include chills, frequency, nausea, urgency and vomiting. Pertinent negatives include no flank pain. She has tried increased fluids (Azo) for the symptoms.      Depression screen PHQ 2/9 10/21/2017  Decreased Interest 2  Down, Depressed, Hopeless 1  PHQ - 2 Score 3  Altered sleeping 2  Tired, decreased energy 2  Change in appetite 0  Feeling bad or failure about yourself  0  Trouble concentrating 0  Moving slowly or fidgety/restless 0  Suicidal thoughts 0  PHQ-9 Score 7  Difficult doing work/chores Not difficult at all    GAD 7 : Generalized Anxiety Score 10/21/2017  Nervous, Anxious, on Edge 1  Control/stop worrying 0  Worry too much - different things 0  Trouble relaxing 0  Restless 0  Easily annoyed or irritable 1  Afraid - awful might happen 1  Total GAD 7 Score 3  Anxiety Difficulty Not difficult at all      Past Medical History:  Diagnosis Date  . Abdominal aortic aneurysm ([place]) 10/21/2014   (CT June 2016 via [facility] contests this Dx) 3.7cm. Asymptomatic. Follow up ultrasound in 2 years.  U/S 07/2015 stable. Managed by surgeon.    . Abdominal pain, left upper quadrant 10/28/2014  . Abdominal pain, right upper quadrant 07/25/2015  . Abscess of breast, right 01/16/2017  . Abscess of right breast 01/28/2017  . Anxiety state 05/17/2014  . Bloating 07/25/2015  . BPV (benign positional vertigo) 09/30/2014  . Cancer ([place])   . Cellulitis of right breast  01/28/2017  . DDD (degenerative disc disease), cervical 08/08/2015  . Diabetes mellitus type II, controlled ([place]) 12/28/2014  . Diarrhea 07/25/2015  . Elevated liver enzymes 05/19/2014  . Endometrial cancer ([place]) 05/16/2014   2010 surgery hysterectomy. Remission.    . Esophageal reflux 05/17/2014   7 years endoscopy normal.    . Essential hypertension, benign 05/17/2014  . Fatty liver disease, nonalcoholic 2/40/9735  . Heaviness of upper extremity 11/28/2016  . Hyperlipidemia 10/13/2015  . Insomnia 05/17/2014  . LUQ pain 12/28/2014  . Morbid obesity ([place]) 10/13/2015  . MRSA infection 01/28/2017  . Muscle spasms of head or neck 08/08/2015  . Notalgia 08/08/2015  . Pre-diabetes 10/13/2015  . Primary osteoarthritis of both knees 05/31/2014  . Right tibial fracture 05/17/2014   Repaired in 2014. Continues to walk with cane.    . Vitamin D deficiency 05/19/2014   Past Surgical History:  Procedure Laterality Date  . ABDOMINAL HYSTERECTOMY    . LEFT HEART CATH AND CORONARY ANGIOGRAPHY N/A 05/19/2017   Procedure: LEFT HEART CATH AND CORONARY ANGIOGRAPHY;  Surgeon: Jettie Booze, MD;  Location: [facility];  Service: Cardiovascular;  Laterality: N/A;  . ORTHOPEDIC SURGERY     steel rod placed in tibia   Social History   Tobacco Use  . Smoking status: Never Smoker  . Smokeless tobacco: Never Used  Substance Use Topics  . Alcohol use: No   family history includes COPD in her father; Diabetes in her mother; Heart failure in her father and mother; Stroke in her mother.    ROS: negative except as noted in the HPI  Medications: Current Outpatient Medications  Medication Sig Dispense Refill  . acetaminophen (TYLENOL) 500 MG tablet Take 500 mg by mouth daily as needed for mild pain or headache.    . ALPRAZolam (XANAX) 0.5 MG tablet Take 1 tablet (0.5 mg total) by mouth at bedtime. 30 tablet 5  . AMBULATORY NON FORMULARY MEDICATION Glucometer, test strips, and lancets. Test blood glucose  twice daily.  Please fill what insurance prefers. Dx; E11.9 100 each 2  . aspirin EC 81 MG tablet Take 81 mg by mouth daily.    [name] Kitchen atorvastatin (LIPITOR) 40 MG tablet TAKE ONE TABLET BY MOUTH ONCE DAILY 30 tablet 11  . dapagliflozin propanediol (FARXIGA) 10 MG TABS tablet Take 10 mg by mouth daily. 30 tablet 1  . metoprolol succinate (TOPROL-XL) 25 MG 24 hr tablet Take 25 mg by mouth daily as needed.     . ondansetron (ZOFRAN) 8 MG tablet Take 1 tablet (8 mg total) by mouth every 8 (eight) hours as needed for nausea or vomiting. 20 tablet 1  . pantoprazole (PROTONIX) 40 MG tablet TAKE ONE TABLET BY MOUTH ONCE DAILY 30 tablet 5  . telmisartan-hydrochlorothiazide (MICARDIS HCT) 40-12.5 MG tablet Take 1 tablet by mouth daily. 90 tablet 1   No current facility-administered medications for this visit.    Allergies  Allergen Reactions  . Dexlansoprazole Other (See Comments)    Whole body aches/pain.  Whole body aches/pain.   . Metformin     GI side effects  . Metformin And Related Nausea And Vomiting  . Xigduo Xr [Dapagliflozin-Metformin Hcl Er]     Nausea/vomiting.   . Diclofenac Rash    Topical cream gave RASH.        Objective:  BP 105/64   Pulse 94   Temp 98.1 F (36.7 C) (Oral)   Resp 20   Ht 5' (1.524 m)   Wt (!) 310 lb (140.6 kg)   SpO2 98%   BMI 60.54 kg/m  Gen:  alert, not ill-appearing, no distress, appropriate for age, obese female HEENT: head normocephalic without obvious abnormality, conjunctiva and cornea clear, trachea midline Pulm: Normal work of breathing, normal phonation, clear to auscultation bilaterally, no wheezes, rales or rhonchi GI: abdomen soft, nontender, no CVA tenderness MSK: extremities atraumatic, normal gait and station Skin: intact, no rashes on exposed skin, no jaundice, no cyanosis Psych: well-groomed, cooperative, good eye contact, euthymic mood, affect mood-congruent, speech is articulate, and thought processes clear and  goal-directed    Results for orders placed or performed in visit on 12/24/17 (from the past 72 hour(s))  POCT urinalysis dipstick     Status: Abnormal   Collection Time: 12/24/17  4:07 PM  Result Value Ref Range   Color, UA Orange    Clarity, UA Cloudy    Glucose, UA 100mg /dL    Bilirubin, UA Neg    Ketones, UA Neg    Spec Grav, UA 1.020 1.010 - 1.025   Blood, UA Trace    pH, UA 6.0 5.0 - 8.0   Protein,  UA Trace    Urobilinogen, UA 1.0 0.2 or 1.0 E.U./dL   Nitrite, UA Pos    Leukocytes, UA Large (3+) (A) Negative   Appearance     Odor     No results found.    Assessment and Plan: 52 y.o. female with   1. Dysuria - POCT urinalysis dipstick grossly positive, but contaminated by Azo - Urine Culture pending - afebrile, no CVA tenderness, reports nausea/vomiting is due to Ozempic. Treating empirically for uncomplicated cystitis with Macrobid  Dysuria - Plan: POCT urinalysis dipstick, Urine Culture, nitrofurantoin, macrocrystal-monohydrate, (MACROBID) 100 MG capsule  Patient education and anticipatory guidance given Patient agrees with treatment plan Follow-up as needed if symptoms worsen or fail to improve  Darlyne Russian PA-C

## 2017-12-26 LAB — URINE CULTURE
MICRO NUMBER:: 90501816
SPECIMEN QUALITY:: ADEQUATE

## 2017-12-26 NOTE — Progress Notes (Signed)
Urine culture was positive for bladder infection Macrobid should work well for this If still having symptoms after completing the antibiotic, let us know and we will send in a different one

## 2018-01-19 ENCOUNTER — Ambulatory Visit (INDEPENDENT_AMBULATORY_CARE_PROVIDER_SITE_OTHER): Payer: BLUE CROSS/BLUE SHIELD | Admitting: Physician Assistant

## 2018-01-19 ENCOUNTER — Encounter: Payer: Self-pay | Admitting: Physician Assistant

## 2018-01-19 VITALS — BP 101/46 | HR 75 | Ht 60.0 in | Wt 305.0 lb

## 2018-01-19 DIAGNOSIS — N3000 Acute cystitis without hematuria: Secondary | ICD-10-CM

## 2018-01-19 DIAGNOSIS — R031 Nonspecific low blood-pressure reading: Secondary | ICD-10-CM | POA: Diagnosis not present

## 2018-01-19 DIAGNOSIS — I714 Abdominal aortic aneurysm, without rupture, unspecified: Secondary | ICD-10-CM

## 2018-01-19 DIAGNOSIS — R300 Dysuria: Secondary | ICD-10-CM

## 2018-01-19 DIAGNOSIS — I10 Essential (primary) hypertension: Secondary | ICD-10-CM

## 2018-01-19 DIAGNOSIS — I959 Hypotension, unspecified: Secondary | ICD-10-CM | POA: Insufficient documentation

## 2018-01-19 DIAGNOSIS — E1165 Type 2 diabetes mellitus with hyperglycemia: Secondary | ICD-10-CM

## 2018-01-19 LAB — POCT URINALYSIS DIPSTICK
Bilirubin, UA: NEGATIVE
Blood, UA: NEGATIVE
GLUCOSE UA: NEGATIVE
Ketones, UA: NEGATIVE
NITRITE UA: NEGATIVE
PROTEIN UA: NEGATIVE
Spec Grav, UA: 1.01 (ref 1.010–1.025)
Urobilinogen, UA: 0.2 E.U./dL
pH, UA: 6 (ref 5.0–8.0)

## 2018-01-19 LAB — POCT GLYCOSYLATED HEMOGLOBIN (HGB A1C): HEMOGLOBIN A1C: 7.7

## 2018-01-19 MED ORDER — SEMAGLUTIDE (1 MG/DOSE) 2 MG/1.5ML ~~LOC~~ SOPN: 1.0000 "application " | PEN_INJECTOR | SUBCUTANEOUS | 2 refills | Status: AC

## 2018-01-19 MED ORDER — NITROFURANTOIN MONOHYD MACRO 100 MG PO CAPS: 100.0000 mg | ORAL_CAPSULE | Freq: Two times a day (BID) | ORAL | 0 refills | Status: DC

## 2018-01-19 NOTE — Progress Notes (Signed)
Subjective:    Patient ID: Tamara Greer, female    DOB: 1965-11-04, 52 y.o.   MRN: [id number]  HPI  52 year old female presents for 2 month diabetes follow-up. At the last visit we increased the dose of her Ozempic to 0.5 mg and added [place]. She never got the the [place] filled. Her A1C at the last visit was 9.8 and today is 7.7. Her morning BGLs have been as low as 130 and as high as 256. She states she has been compliant with the Ozempic, and has also increased her water intake and started walking for exercise. At the last visit she was considering joining [employer], but she has put that on hold because she has started walking and it has been working for her.  She also feels like she has another UTI. She had one about a month ago as well and was treated with Macrobid which resolved the infection. She has been having burning and urgency for the past week. She states that she feels the burning feeling especially in the morning.  .. Active Ambulatory Problems    Diagnosis Date Noted  . Endometrial cancer ([place]) 05/16/2014  . Essential hypertension, benign 05/17/2014  . Insomnia 05/17/2014  . Anxiety 05/17/2014  . Right tibial fracture 05/17/2014  . Esophageal reflux 05/17/2014  . Vitamin D deficiency 05/19/2014  . Elevated liver enzymes 05/19/2014  . Primary osteoarthritis of both knees 05/31/2014  . BPV (benign positional vertigo) 09/30/2014  . Fatty liver disease, nonalcoholic 92/33/0076  . Abdominal aortic aneurysm ([place]) 10/21/2014  . Abdominal pain, left upper quadrant 10/28/2014  . LUQ pain 12/28/2014  . Abdominal pain, right upper quadrant 07/25/2015  . Diarrhea 07/25/2015  . Bloating 07/25/2015  . DDD (degenerative disc disease), cervical 08/08/2015  . Muscle spasms of head or neck 08/08/2015  . Notalgia 08/08/2015  . Morbid obesity ([place]) 10/13/2015  . Pre-diabetes 10/13/2015  . Hyperlipidemia 10/13/2015  . Heaviness of upper extremity 11/28/2016  . Abscess of  breast, right 01/16/2017  . Cellulitis of right breast 01/28/2017  . Abscess of right breast 01/28/2017  . MRSA infection 01/28/2017  . Angina pectoris ([place])   . No energy 10/23/2017  . Uncontrolled type 2 diabetes mellitus with hyperglycemia ([place]) 11/21/2017  . Medication side effect 11/21/2017  . Low blood pressure reading 01/19/2018   Resolved Ambulatory Problems    Diagnosis Date Noted  . Diabetes mellitus type II, controlled ([place]) 12/28/2014   Past Medical History:  Diagnosis Date  . Abdominal aortic aneurysm ([place]) 10/21/2014  . Abdominal pain, left upper quadrant 10/28/2014  . Abdominal pain, right upper quadrant 07/25/2015  . Abscess of breast, right 01/16/2017  . Abscess of right breast 01/28/2017  . Anxiety state 05/17/2014  . Bloating 07/25/2015  . BPV (benign positional vertigo) 09/30/2014  . Cancer ([place])   . Cellulitis of right breast 01/28/2017  . DDD (degenerative disc disease), cervical 08/08/2015  . Diabetes mellitus type II, controlled ([place]) 12/28/2014  . Diarrhea 07/25/2015  . Elevated liver enzymes 05/19/2014  . Endometrial cancer ([place]) 05/16/2014  . Esophageal reflux 05/17/2014  . Essential hypertension, benign 05/17/2014  . Fatty liver disease, nonalcoholic 10/28/3333  . Heaviness of upper extremity 11/28/2016  . Hyperlipidemia 10/13/2015  . Insomnia 05/17/2014  . LUQ pain 12/28/2014  . Morbid obesity ([place]) 10/13/2015  . MRSA infection 01/28/2017  . Muscle spasms of head or neck 08/08/2015  . Notalgia 08/08/2015  . Pre-diabetes 10/13/2015  . Primary osteoarthritis of both knees 05/31/2014  .  Right tibial fracture 05/17/2014  . Vitamin D deficiency 05/19/2014     Review of Systems  All other systems reviewed and are negative.      Objective:   Physical Exam  Constitutional: She is oriented to person, place, and time. She appears well-developed and well-nourished.  HENT:  Head: Normocephalic and atraumatic.  Eyes: Pupils are equal, round, and reactive to light.  Conjunctivae and EOM are normal.  Cardiovascular: Normal rate, regular rhythm and normal heart sounds.  Pulmonary/Chest: Effort normal and breath sounds normal.  No CVA tenderness.   Abdominal: Soft. Bowel sounds are normal. She exhibits no distension and no mass. There is no tenderness. There is no guarding.  Neurological: She is alert and oriented to person, place, and time.  Skin: Skin is warm and dry.  Psychiatric: She has a normal mood and affect. Her behavior is normal. Judgment and thought content normal.  Vitals reviewed.     Assessment & Plan:  [name] KitchenMarland KitchenDiem was seen today for diabetes.  Diagnoses and all orders for this visit:  Uncontrolled type 2 diabetes mellitus with hyperglycemia ([place]) -     POCT HgB A1C -     Semaglutide (OZEMPIC) 1 MG/DOSE SOPN; Inject 1 application into the skin once a week.  Dysuria -     POCT urinalysis dipstick -     Urine Culture  Acute cystitis without hematuria -     nitrofurantoin, macrocrystal-monohydrate, (MACROBID) 100 MG capsule; Take 1 capsule (100 mg total) by mouth 2 (two) times daily.  Essential hypertension, benign  Abdominal aortic aneurysm (AAA) without rupture ([facility])  Low blood pressure reading   .[name] Kitchen Results for orders placed or performed in visit on 01/19/18  POCT urinalysis dipstick  Result Value Ref Range   Color, UA yellow    Clarity, UA cloudy    Glucose, UA neg    Bilirubin, UA neg    Ketones, UA neg    Spec Grav, UA 1.010 1.010 - 1.025   Blood, UA neg    pH, UA 6.0 5.0 - 8.0   Protein, UA neg    Urobilinogen, UA 0.2 0.2 or 1.0 E.U./dL   Nitrite, UA neg    Leukocytes, UA Moderate (2+) (A) Negative   Appearance     Odor    POCT HgB A1C  Result Value Ref Range   Hemoglobin A1C 7.7    Will culture. Due to symptoms will go ahead and treat with macrobid.   A!C much better but not controlled.  Will increase dose of Ozempic from 0.5 mg once weekly to 1 mg once weekly in order to hopefully decrease her A1C to  less than 7.  BP controlled. On ACE.  On statin.  Vaccines up to date.  Needs eye exam. Continue with diet changes and exercise. Goal is under 300 at next visit.    Advised her to cut the tablet of metoprolol in half due to her blood pressure being low today and the fact that she occasionally feels dizzy in the evenings.  Follow up in 3 months.

## 2018-01-19 NOTE — Patient Instructions (Addendum)
Cut metoprolol in half due to BP being too low.  Increase ozempic to 1mg  weekly.

## 2018-01-21 LAB — URINE CULTURE
MICRO NUMBER:: 90611014
SPECIMEN QUALITY: ADEQUATE

## 2018-01-21 MED ORDER — AMOXICILLIN-POT CLAVULANATE 500-125 MG PO TABS: 1.0000 | ORAL_TABLET | Freq: Three times a day (TID) | ORAL | 0 refills | Status: DC

## 2018-01-21 NOTE — Addendum Note (Signed)
Addended by: Donella Stade on: 01/21/2018 03:26 PM   Modules accepted: Orders

## 2018-01-21 NOTE — Progress Notes (Signed)
Call pt : culture showed klebsiella pneumonia and macrobid will not fully cover. Sent augmentin which will fully cover. Stop macrobid.

## 2018-02-19 ENCOUNTER — Other Ambulatory Visit: Payer: Self-pay | Admitting: Physician Assistant

## 2018-02-20 DIAGNOSIS — G4733 Obstructive sleep apnea (adult) (pediatric): Secondary | ICD-10-CM | POA: Diagnosis not present

## 2018-03-01 ENCOUNTER — Other Ambulatory Visit: Payer: Self-pay | Admitting: Physician Assistant

## 2018-03-01 DIAGNOSIS — K2100 Gastro-esophageal reflux disease with esophagitis, without bleeding: Secondary | ICD-10-CM

## 2018-04-01 ENCOUNTER — Other Ambulatory Visit: Payer: Self-pay | Admitting: Physician Assistant

## 2018-04-01 DIAGNOSIS — I10 Essential (primary) hypertension: Secondary | ICD-10-CM

## 2018-04-03 ENCOUNTER — Other Ambulatory Visit: Payer: Self-pay | Admitting: *Deleted

## 2018-04-03 ENCOUNTER — Other Ambulatory Visit: Payer: Self-pay | Admitting: Physician Assistant

## 2018-04-03 DIAGNOSIS — I10 Essential (primary) hypertension: Secondary | ICD-10-CM

## 2018-04-05 ENCOUNTER — Other Ambulatory Visit: Payer: Self-pay | Admitting: Physician Assistant

## 2018-04-05 DIAGNOSIS — I10 Essential (primary) hypertension: Secondary | ICD-10-CM

## 2018-04-07 ENCOUNTER — Other Ambulatory Visit: Payer: Self-pay | Admitting: Physician Assistant

## 2018-04-07 DIAGNOSIS — I10 Essential (primary) hypertension: Secondary | ICD-10-CM

## 2018-04-24 ENCOUNTER — Other Ambulatory Visit: Payer: Self-pay | Admitting: Physician Assistant

## 2018-04-24 DIAGNOSIS — F419 Anxiety disorder, unspecified: Secondary | ICD-10-CM

## 2018-05-01 ENCOUNTER — Other Ambulatory Visit: Payer: Self-pay | Admitting: Physician Assistant

## 2018-05-01 IMAGING — CT CT ANGIO ABDOMEN
2 of 6 series · 15 of 46 positions shown, 17 images · IV contrast (ISOVUE 370)
Comparison: CT scan of February 01, 2015.

CLINICAL DATA: Abdominal aortic aneurysm.

EXAM:
CT ANGIOGRAPHY ABDOMEN
TECHNIQUE: Multidetector CT imaging of the abdomen was performed using the
standard protocol during bolus administration of intravenous
contrast. Multiplanar reconstructed images and MIPs were obtained
and reviewed to evaluate the vascular anatomy.
CONTRAST:  100 mL of Isovue 370 intravenously.

[Series 4: arterial 3.0 i40f 2 · axial · arterial · 0.84mm/px · z∈[-328,-76]mm · 12 of 92 slices shown, 14 images]
[im 4/92  soft-tissue]
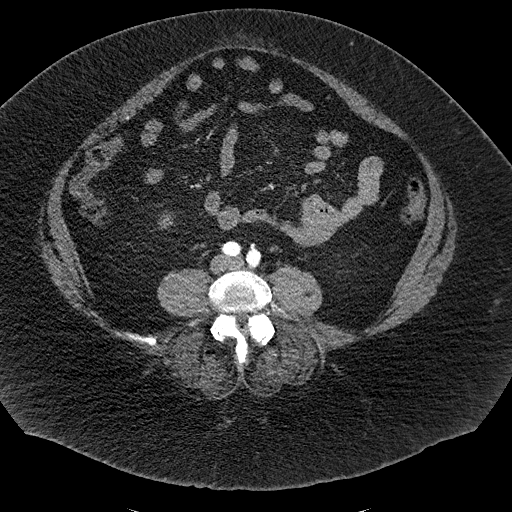
[im 4/92  bone]
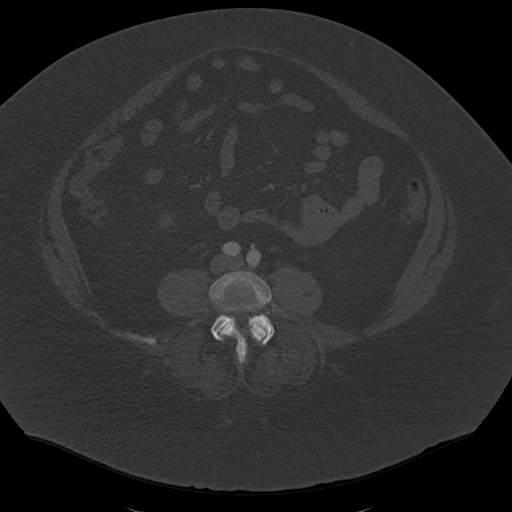
[im 12/92  soft-tissue]
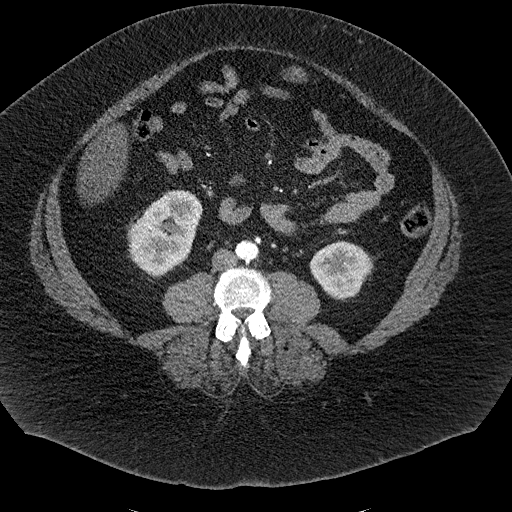
[im 19/92  soft-tissue]
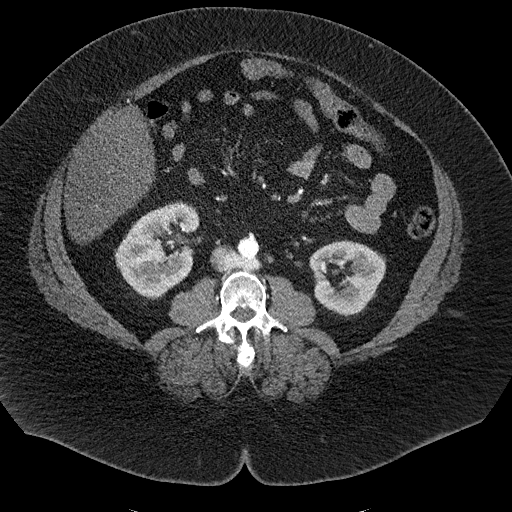
[im 27/92  soft-tissue]
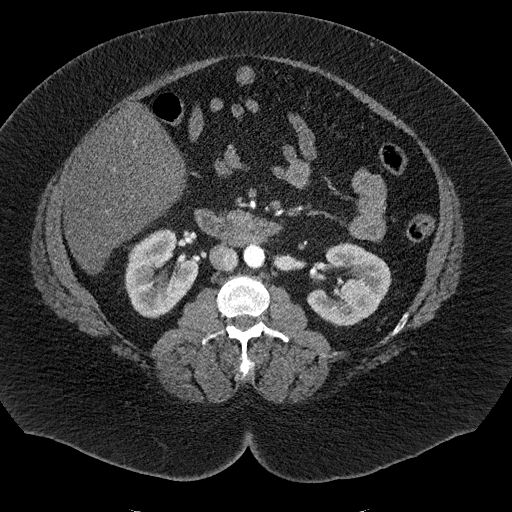
[im 35/92  soft-tissue]
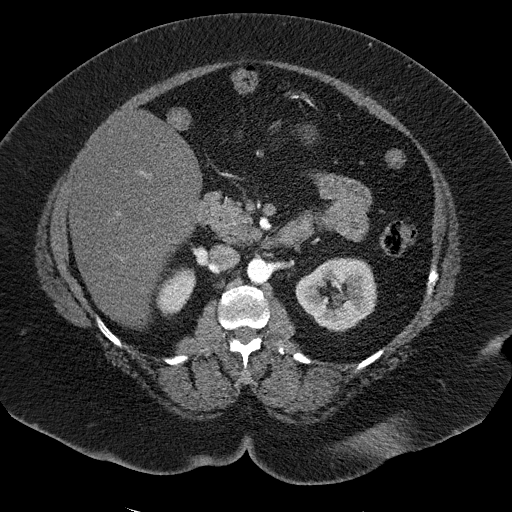
[im 42/92  soft-tissue]
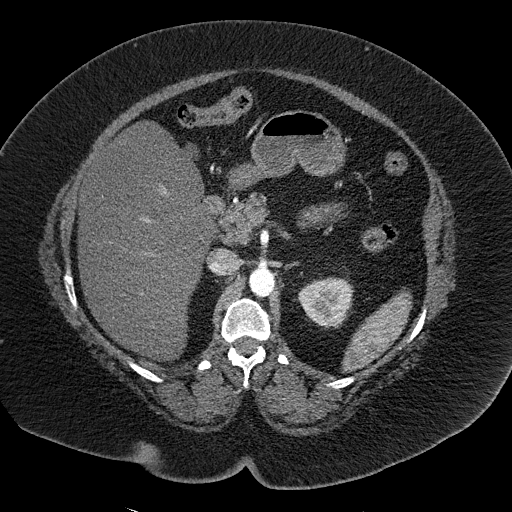
[im 50/92  soft-tissue]
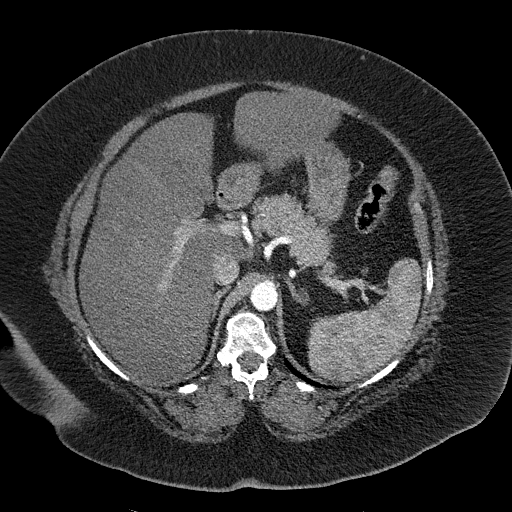
[im 57/92  soft-tissue]
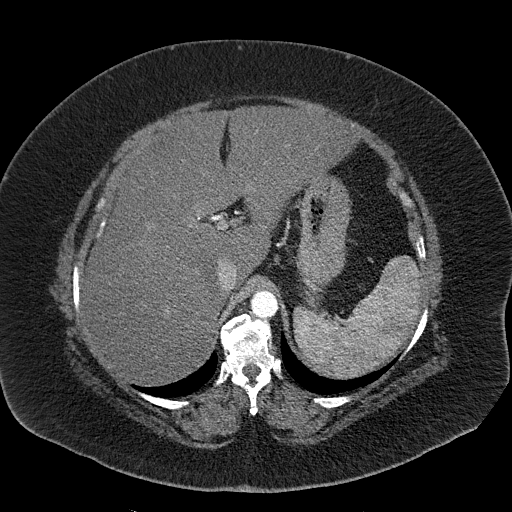
[im 65/92  soft-tissue]
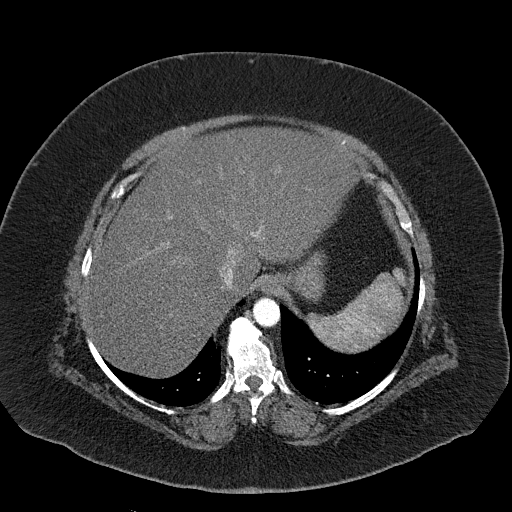
[im 65/92  bone]
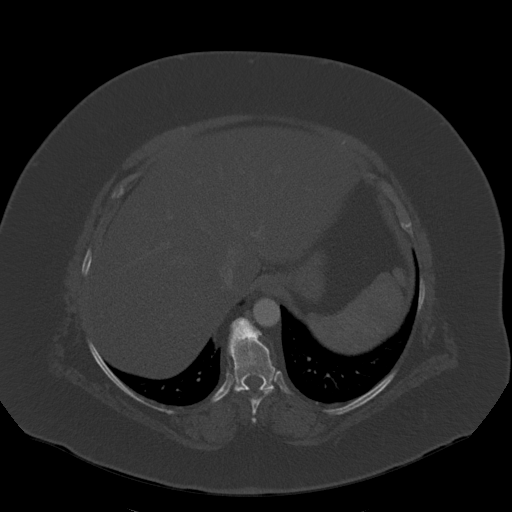
[im 73/92  soft-tissue]
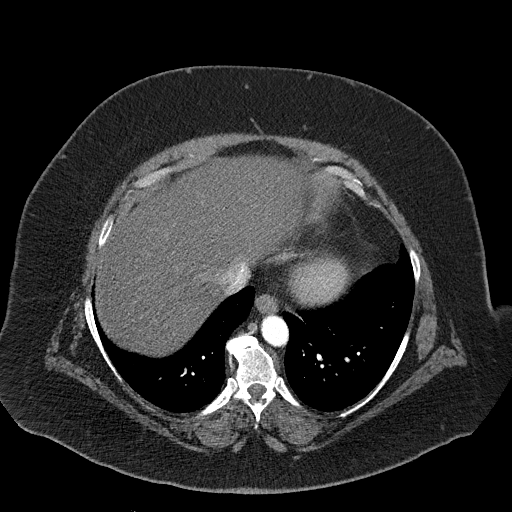
[im 80/92  soft-tissue]
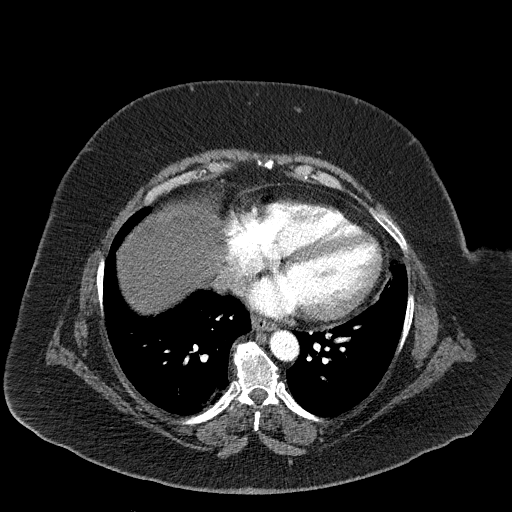
[im 88/92  soft-tissue]
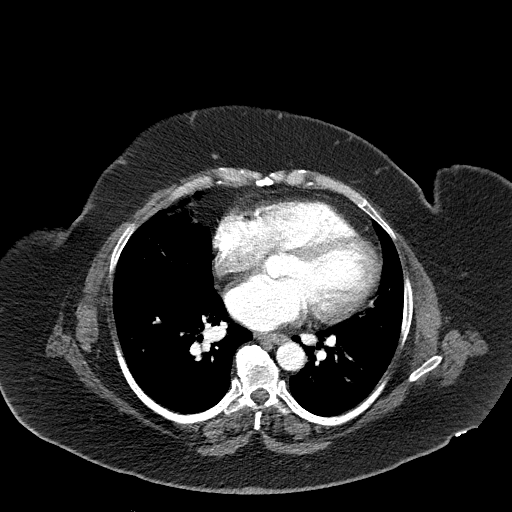

[Series 7: coronals · coronal · 0.59mm/px · 3 of 176 slices shown]
[im 44/176  soft-tissue]
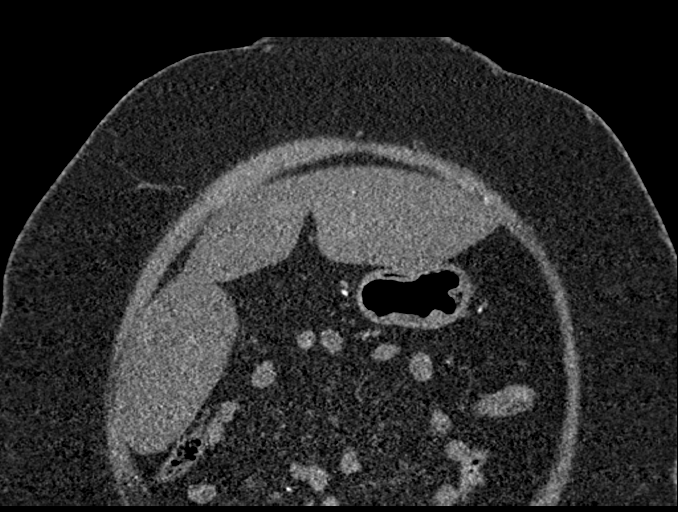
[im 88/176  soft-tissue]
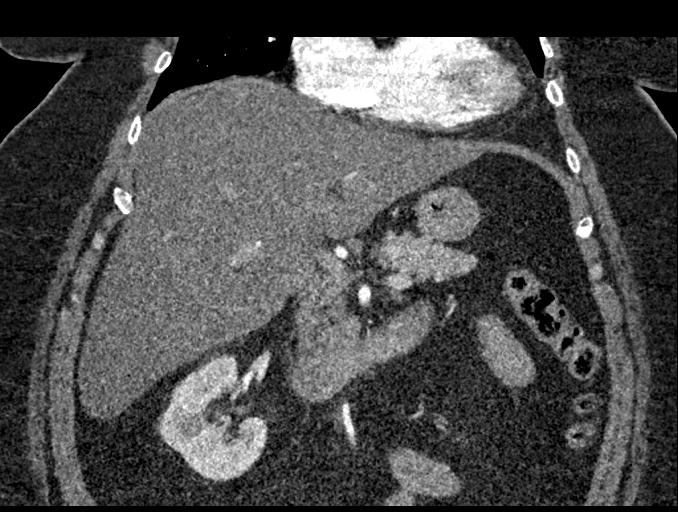
[im 132/176  soft-tissue]
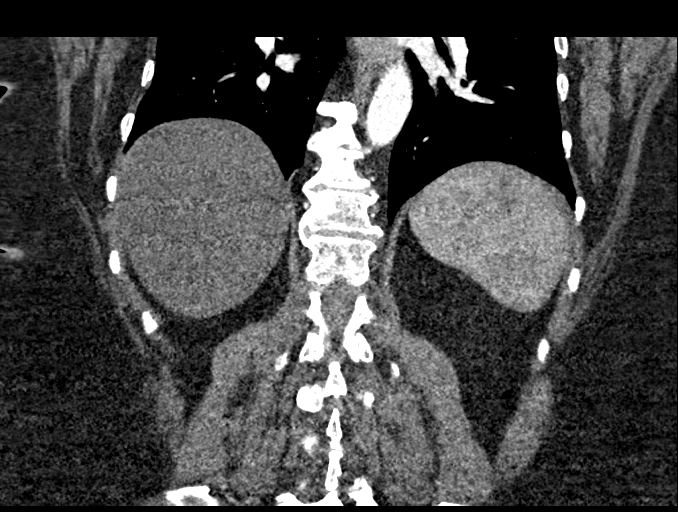

[15 of 46 positions shown; findings below may reference images not displayed]

FINDINGS: VASCULAR

Aorta: Normal caliber aorta without aneurysm, dissection, vasculitis
or significant stenosis.

Celiac: Patent without evidence of aneurysm, dissection, vasculitis
or significant stenosis.

SMA: Patent without evidence of aneurysm, dissection, vasculitis or
significant stenosis.

Renals: Both renal arteries are patent without evidence of aneurysm,
dissection, vasculitis, fibromuscular dysplasia or significant
stenosis.

IMA: Patent without evidence of aneurysm, dissection, vasculitis or
significant stenosis.

Inflow: Patent without evidence of aneurysm, dissection, vasculitis
or significant stenosis.

Veins: No obvious venous abnormality within the limitations of this
arterial phase study.

Review of the MIP images confirms the above findings.

NON-VASCULAR

Lower chest: No acute abnormality.

Hepatobiliary: No gallstones are noted. Fatty infiltration of the
liver is noted.

Pancreas: Unremarkable. No pancreatic ductal dilatation or
surrounding inflammatory changes.

Spleen: Normal in size without focal abnormality.

Adrenals/Urinary Tract: Adrenal glands and kidneys are unremarkable.
No hydronephrosis or renal obstruction is noted.

Stomach/Bowel: Stomach and visualized bowel loops are unremarkable.

Lymphatic: No significant adenopathy is noted.

Other: No evidence of hernia or abnormal fluid collection is noted.

Musculoskeletal: No acute or significant osseous findings.
IMPRESSION: VASCULAR

No evidence of abdominal aortic aneurysm or dissection.

NON-VASCULAR

Fatty infiltration of the liver. No other definite abnormality seen
in the abdomen.

## 2018-05-26 DIAGNOSIS — G4733 Obstructive sleep apnea (adult) (pediatric): Secondary | ICD-10-CM | POA: Diagnosis not present

## 2018-07-16 ENCOUNTER — Other Ambulatory Visit: Payer: Self-pay | Admitting: Physician Assistant

## 2018-07-16 DIAGNOSIS — I10 Essential (primary) hypertension: Secondary | ICD-10-CM

## 2018-08-25 DIAGNOSIS — G4733 Obstructive sleep apnea (adult) (pediatric): Secondary | ICD-10-CM | POA: Diagnosis not present

## 2018-08-28 ENCOUNTER — Other Ambulatory Visit: Payer: Self-pay | Admitting: Physician Assistant

## 2018-08-28 DIAGNOSIS — I10 Essential (primary) hypertension: Secondary | ICD-10-CM

## 2018-09-12 DIAGNOSIS — M25661 Stiffness of right knee, not elsewhere classified: Secondary | ICD-10-CM | POA: Insufficient documentation

## 2018-09-12 DIAGNOSIS — S82142A Displaced bicondylar fracture of left tibia, initial encounter for closed fracture: Secondary | ICD-10-CM | POA: Diagnosis not present

## 2018-09-12 DIAGNOSIS — E119 Type 2 diabetes mellitus without complications: Secondary | ICD-10-CM | POA: Insufficient documentation

## 2018-09-12 DIAGNOSIS — M1712 Unilateral primary osteoarthritis, left knee: Secondary | ICD-10-CM | POA: Insufficient documentation

## 2018-09-12 DIAGNOSIS — W1849XA Other slipping, tripping and stumbling without falling, initial encounter: Secondary | ICD-10-CM | POA: Diagnosis not present

## 2018-09-12 DIAGNOSIS — S82122A Displaced fracture of lateral condyle of left tibia, initial encounter for closed fracture: Secondary | ICD-10-CM | POA: Diagnosis not present

## 2018-09-12 DIAGNOSIS — S82142D Displaced bicondylar fracture of left tibia, subsequent encounter for closed fracture with routine healing: Secondary | ICD-10-CM | POA: Insufficient documentation

## 2018-09-12 DIAGNOSIS — G4734 Idiopathic sleep related nonobstructive alveolar hypoventilation: Secondary | ICD-10-CM | POA: Insufficient documentation

## 2018-09-12 DIAGNOSIS — E1165 Type 2 diabetes mellitus with hyperglycemia: Secondary | ICD-10-CM | POA: Diagnosis not present

## 2018-09-12 DIAGNOSIS — Y999 Unspecified external cause status: Secondary | ICD-10-CM | POA: Diagnosis not present

## 2018-09-14 DIAGNOSIS — S82109A Unspecified fracture of upper end of unspecified tibia, initial encounter for closed fracture: Secondary | ICD-10-CM | POA: Diagnosis not present

## 2018-09-14 DIAGNOSIS — S82122D Displaced fracture of lateral condyle of left tibia, subsequent encounter for closed fracture with routine healing: Secondary | ICD-10-CM | POA: Diagnosis not present

## 2018-09-14 DIAGNOSIS — Z79891 Long term (current) use of opiate analgesic: Secondary | ICD-10-CM | POA: Diagnosis not present

## 2018-09-14 DIAGNOSIS — W19XXXD Unspecified fall, subsequent encounter: Secondary | ICD-10-CM | POA: Diagnosis not present

## 2018-09-14 DIAGNOSIS — Y92012 Bathroom of single-family (private) house as the place of occurrence of the external cause: Secondary | ICD-10-CM | POA: Diagnosis not present

## 2018-09-14 DIAGNOSIS — E119 Type 2 diabetes mellitus without complications: Secondary | ICD-10-CM | POA: Diagnosis not present

## 2018-09-18 ENCOUNTER — Other Ambulatory Visit: Payer: Self-pay

## 2018-09-18 ENCOUNTER — Telehealth: Payer: Self-pay

## 2018-09-18 DIAGNOSIS — I10 Essential (primary) hypertension: Secondary | ICD-10-CM

## 2018-09-18 MED ORDER — TELMISARTAN-HCTZ 40-12.5 MG PO TABS: ORAL_TABLET | ORAL | 0 refills | Status: DC

## 2018-09-18 NOTE — Telephone Encounter (Signed)
I will be looking out to sign orders. So sorry for her.   Ok to refill medications needed for 3 months.

## 2018-09-18 NOTE — Telephone Encounter (Signed)
Okay thanks! Medication has been refilled and patient has been notified.

## 2018-09-18 NOTE — Telephone Encounter (Signed)
Patient called today saying that she broke her leg last week and [facility] will be faxing over some paperwork for you to sign off on.   Patient also states she is in need of a refill of her telimisartan, but she is due for a follow-up appointment. Patient states that since she is wheel-chair bound right now, she would like to wait to follow-up but still needs that medication refilled. I wanted to ask you before I refill it just to be sure it was okay to refill. Patient states she will follow-up after this refill runs out. I have pended the medication for refill, and the pharmacy on file is correct. No further questions or concerns at this time.

## 2018-09-21 ENCOUNTER — Telehealth: Payer: Self-pay

## 2018-09-21 NOTE — Telephone Encounter (Signed)
[facility] faxed over paperwork requesting PCP signature for medical equipment order for patient due to having a broken leg. I have sent over requested documents along with the most recent office note. Fax confirmation received.

## 2018-09-25 DIAGNOSIS — S82142D Displaced bicondylar fracture of left tibia, subsequent encounter for closed fracture with routine healing: Secondary | ICD-10-CM | POA: Diagnosis not present

## 2018-09-25 DIAGNOSIS — S82125D Nondisplaced fracture of lateral condyle of left tibia, subsequent encounter for closed fracture with routine healing: Secondary | ICD-10-CM | POA: Diagnosis not present

## 2018-09-25 DIAGNOSIS — M1712 Unilateral primary osteoarthritis, left knee: Secondary | ICD-10-CM | POA: Diagnosis not present

## 2018-10-01 DIAGNOSIS — R269 Unspecified abnormalities of gait and mobility: Secondary | ICD-10-CM | POA: Diagnosis not present

## 2018-10-01 DIAGNOSIS — G4733 Obstructive sleep apnea (adult) (pediatric): Secondary | ICD-10-CM | POA: Diagnosis not present

## 2018-10-01 DIAGNOSIS — S82402A Unspecified fracture of shaft of left fibula, initial encounter for closed fracture: Secondary | ICD-10-CM | POA: Diagnosis not present

## 2018-10-21 ENCOUNTER — Other Ambulatory Visit: Payer: Self-pay | Admitting: Family Medicine

## 2018-10-21 DIAGNOSIS — F419 Anxiety disorder, unspecified: Secondary | ICD-10-CM

## 2018-10-22 NOTE — Telephone Encounter (Signed)
Routing to pcp for review and signature.Dameon Soltis [place], CMA  

## 2018-10-23 NOTE — Telephone Encounter (Signed)
Needs appt.  Last refill 09/20/18.  [name] KitchenMarland KitchenPDMP reviewed during this encounter. Given small quanity to get into office.

## 2018-11-19 ENCOUNTER — Other Ambulatory Visit: Payer: Self-pay | Admitting: Physician Assistant

## 2018-11-19 DIAGNOSIS — F419 Anxiety disorder, unspecified: Secondary | ICD-10-CM

## 2018-11-24 DIAGNOSIS — G4733 Obstructive sleep apnea (adult) (pediatric): Secondary | ICD-10-CM | POA: Diagnosis not present

## 2018-11-25 ENCOUNTER — Other Ambulatory Visit: Payer: Self-pay

## 2018-11-25 ENCOUNTER — Encounter: Payer: Self-pay | Admitting: Physician Assistant

## 2018-11-25 ENCOUNTER — Ambulatory Visit (INDEPENDENT_AMBULATORY_CARE_PROVIDER_SITE_OTHER): Payer: BLUE CROSS/BLUE SHIELD | Admitting: Physician Assistant

## 2018-11-25 DIAGNOSIS — K2100 Gastro-esophageal reflux disease with esophagitis, without bleeding: Secondary | ICD-10-CM

## 2018-11-25 DIAGNOSIS — I10 Essential (primary) hypertension: Secondary | ICD-10-CM

## 2018-11-25 DIAGNOSIS — E1165 Type 2 diabetes mellitus with hyperglycemia: Secondary | ICD-10-CM | POA: Diagnosis not present

## 2018-11-25 DIAGNOSIS — F419 Anxiety disorder, unspecified: Secondary | ICD-10-CM | POA: Diagnosis not present

## 2018-11-25 DIAGNOSIS — F4323 Adjustment disorder with mixed anxiety and depressed mood: Secondary | ICD-10-CM

## 2018-11-25 MED ORDER — METOPROLOL SUCCINATE ER 25 MG PO TB24: 25.0000 mg | ORAL_TABLET | Freq: Every day | ORAL | 5 refills | Status: DC

## 2018-11-25 MED ORDER — SEMAGLUTIDE(0.25 OR 0.5MG/DOS) 2 MG/1.5ML ~~LOC~~ SOPN: 0.2500 mg | PEN_INJECTOR | SUBCUTANEOUS | 2 refills | Status: DC

## 2018-11-25 MED ORDER — ALPRAZOLAM 0.5 MG PO TABS: 0.5000 mg | ORAL_TABLET | Freq: Every day | ORAL | 0 refills | Status: DC

## 2018-11-25 MED ORDER — ESCITALOPRAM OXALATE 5 MG PO TABS: 5.0000 mg | ORAL_TABLET | Freq: Every day | ORAL | 1 refills | Status: DC

## 2018-11-25 MED ORDER — PANTOPRAZOLE SODIUM 40 MG PO TBEC: 40.0000 mg | DELAYED_RELEASE_TABLET | Freq: Every day | ORAL | 11 refills | Status: DC

## 2018-11-25 MED ORDER — TELMISARTAN-HCTZ 40-12.5 MG PO TABS: ORAL_TABLET | ORAL | 5 refills | Status: DC

## 2018-11-25 NOTE — Progress Notes (Signed)
..Virtual Visit via Telephone Note  I connected with Tamara Greer on 11/30/18 at  8:30 AM EDT by telephone and verified that I am speaking with the correct person using two identifiers.   I discussed the limitations, risks, security and privacy concerns of performing an evaluation and management service by telephone and the availability of in person appointments. I also discussed with the patient that there may be a patient responsible charge related to this service. The patient expressed understanding and agreed to proceed.   History of Present Illness:  Pt is a an obese 53 yo female with T2DM, HTN, GERD, anxiety who calls into clinic needing refills.   She admits she has not followed up like she should due to a recent fracture that has kept her from being very mobile. Her fracture has healed. She is able to bear weight now.   She is not checking her BP or sugars. She admits to stopping ozempic because increased to 1mg  and could not tolerate that dose due to diarrhea. She was doing well on the .25 and .5 dose. She would like to go back down.   She denies any CP, palpitations, headaches or vision changes.   Her mood is not good she is anxious and depressed. She is using more xanax lately to even go to sleep and stop her mind from running away with her. She is worried about covid-19. She does not like being stuck in her home. Denies any SI/HC.   [name] Kitchen. Active Ambulatory Problems    Diagnosis Date Noted  . Endometrial cancer ([place]) 05/16/2014  . Essential hypertension, benign 05/17/2014  . Insomnia 05/17/2014  . Anxiety 05/17/2014  . Right tibial fracture 05/17/2014  . Esophageal reflux 05/17/2014  . Vitamin D deficiency 05/19/2014  . Elevated liver enzymes 05/19/2014  . Primary osteoarthritis of both knees 05/31/2014  . BPV (benign positional vertigo) 09/30/2014  . Fatty liver disease, nonalcoholic 29/47/6546  . Abdominal aortic aneurysm ([place]) 10/21/2014  . Abdominal pain, left upper  quadrant 10/28/2014  . LUQ pain 12/28/2014  . Abdominal pain, right upper quadrant 07/25/2015  . Diarrhea 07/25/2015  . Bloating 07/25/2015  . DDD (degenerative disc disease), cervical 08/08/2015  . Muscle spasms of head or neck 08/08/2015  . Notalgia 08/08/2015  . Morbid obesity ([place]) 10/13/2015  . Pre-diabetes 10/13/2015  . Hyperlipidemia 10/13/2015  . Heaviness of upper extremity 11/28/2016  . Abscess of breast, right 01/16/2017  . Cellulitis of right breast 01/28/2017  . Abscess of right breast 01/28/2017  . MRSA infection 01/28/2017  . Angina pectoris ([place])   . No energy 10/23/2017  . Uncontrolled type 2 diabetes mellitus with hyperglycemia ([place]) 11/21/2017  . Medication side effect 11/21/2017  . Low blood pressure reading 01/19/2018   Resolved Ambulatory Problems    Diagnosis Date Noted  . Diabetes mellitus type II, controlled ([place]) 12/28/2014   Past Medical History:  Diagnosis Date  . Anxiety state 05/17/2014  . Cancer [facility])      Reviewed med, allergy and problem list.   Observations/Objective: No acute distress.  Pt was tearful on the phone and seemed to be overwhelmed and depressed mood.   [name] Kitchen.There were no vitals filed for this visit. There is no height or weight on file to calculate BMI.   Assessment and Plan: [name] KitchenMarland KitchenDiagnoses and all orders for this visit:  Uncontrolled type 2 diabetes mellitus with hyperglycemia ([place]) -     Semaglutide,0.25 or 0.5MG /DOS, (OZEMPIC, 0.25 OR 0.5 MG/DOSE,) 2 MG/1.5ML SOPN; Inject 0.25 mg  into the skin once a week.  Anxiety -     ALPRAZolam (XANAX) 0.5 MG tablet; Take 1 tablet (0.5 mg total) by mouth at bedtime.  Gastroesophageal reflux disease with esophagitis -     pantoprazole (PROTONIX) 40 MG tablet; Take 1 tablet (40 mg total) by mouth daily.  Essential hypertension -     telmisartan-hydrochlorothiazide (MICARDIS HCT) 40-12.5 MG tablet; TAKE 1 TABLET BY MOUTH ONCE DAILY. -     metoprolol succinate (TOPROL-XL) 25 MG 24 hr  tablet; Take 1 tablet (25 mg total) by mouth daily.  Adjustment disorder with mixed anxiety and depressed mood -     escitalopram (LEXAPRO) 5 MG tablet; Take 1 tablet (5 mg total) by mouth daily.  Morbid obesity ([place])   .[name] Kitchen Depression screen [facility] 2/9 11/30/2018 10/21/2017  Decreased Interest 2 2  Down, Depressed, Hopeless 2 1  PHQ - 2 Score 4 3  Altered sleeping 2 2  Tired, decreased energy 2 2  Change in appetite 2 0  Feeling bad or failure about yourself  2 0  Trouble concentrating 1 0  Moving slowly or fidgety/restless 0 0  Suicidal thoughts 0 0  PHQ-9 Score 13 7  Difficult doing work/chores Very difficult Not difficult at all   Pt aware she needs to check BP and pulse for me and get [place] some numbers. I refilled medication for now.   She has not been on anything for her sugars. Get back on ozempic and will recheck in 3 months. Ok to start checking fasting sugars and home and report them to me. Discussed DM diet.  On ACE. On STATIN.  Needs eye exam.  Needs pneumonia vaccine.   Discussed need for mammogram. Will consider when COVID-19 has improved.   Dicussed not only using xanax for anxiety and depression. Start lexapro. Discussed side effects. Follow up in one month. Continue to try to take walks and enjoy beautiful weather. Discussed teletherapy via [facility]. Pt declined today.      Follow Up Instructions:    I discussed the assessment and treatment plan with the patient. The patient was provided an opportunity to ask questions and all were answered. The patient agreed with the plan and demonstrated an understanding of the instructions.   The patient was advised to call back or seek an in-person evaluation if the symptoms worsen or if the condition fails to improve as anticipated.  I provided 22 minutes of non-face-to-face time during this encounter.   [place] Planas, PA-C

## 2019-01-09 ENCOUNTER — Other Ambulatory Visit: Payer: Self-pay | Admitting: Physician Assistant

## 2019-01-09 DIAGNOSIS — F419 Anxiety disorder, unspecified: Secondary | ICD-10-CM

## 2019-01-11 ENCOUNTER — Telehealth: Payer: Self-pay | Admitting: Neurology

## 2019-01-11 NOTE — Telephone Encounter (Signed)
Patient left vm stating she wants to get a new bipap machine. Her machine is 55 years old and has not had a sleep study in over 15 years.

## 2019-01-11 NOTE — Telephone Encounter (Signed)
Need virtual follow up.

## 2019-01-11 NOTE — Telephone Encounter (Signed)
Last RF sent 12/11/18 for #30 no RF  Last OV 11/25/18  RF pended.

## 2019-01-12 ENCOUNTER — Telehealth: Payer: Self-pay | Admitting: Physician Assistant

## 2019-01-12 ENCOUNTER — Ambulatory Visit (INDEPENDENT_AMBULATORY_CARE_PROVIDER_SITE_OTHER): Payer: BLUE CROSS/BLUE SHIELD | Admitting: Physician Assistant

## 2019-01-12 VITALS — Ht 60.0 in

## 2019-01-12 DIAGNOSIS — F419 Anxiety disorder, unspecified: Secondary | ICD-10-CM

## 2019-01-12 DIAGNOSIS — G4733 Obstructive sleep apnea (adult) (pediatric): Secondary | ICD-10-CM

## 2019-01-12 MED ORDER — ALPRAZOLAM 0.5 MG PO TABS: 0.5000 mg | ORAL_TABLET | Freq: Every day | ORAL | 0 refills | Status: DC

## 2019-01-12 NOTE — Telephone Encounter (Signed)
Spoke with [facility]. They state we would need to send a new order for Bipap machine with pressures (last they had was 15/11) with her last clinic note stating it isn't working, to fax number [phone] and they can get her a new machine.

## 2019-01-12 NOTE — Progress Notes (Deleted)
Patient wants to discuss getting new bipap machine.  Also needs refills of Xanax. PHQ9-GAD7 completed.

## 2019-01-12 NOTE — Telephone Encounter (Signed)
Pt's Bipap broke 2 days ago and needs a new replacement. She uses [facility]. Can we call and see what they need to replace her supplies?

## 2019-01-12 NOTE — Progress Notes (Signed)
Patient ID: Tamara Greer, female   DOB: Jul 19, 1966, 53 y.o.   MRN: [id number] .[name] KitchenVirtual Visit via Telephone Note  I connected with Tamara Greer on 01/13/19 at  1:20 PM EDT by telephone and verified that I am speaking with the correct person using two identifiers.  Location: Patient: home Provider: clinic   I discussed the limitations, risks, security and privacy concerns of performing an evaluation and management service by telephone and the availability of in person appointments. I also discussed with the patient that there may be a patient responsible charge related to this service. The patient expressed understanding and agreed to proceed.   History of Present Illness: Pt is a morbidly obese female with T2DM, OSA, anxiety who calls into the clinic for follow up.   She is requesting a refill on xanax. It was started one month ago during the Tamara Greer pandemic and had a lot of things going on at home. She feels like it has really helped to have to relax at night. Her husband is still an alcoholic and her son still moved out but she feels like she is coping better. She tried lexapro but made her feel like a "zombie". She did not like it. She has been able to get out of the house a little more and that has helped some.   She needs a new bipap machine. 2 days ago pressure has been way off. She usually can feel air pressure at night and now there is no pressure.. She is not sleeping or breathing good at night. She has been on for years and done really well.   .. Active Ambulatory Problems    Diagnosis Date Noted  . Endometrial cancer ([place]) 05/16/2014  . Essential hypertension, benign 05/17/2014  . Insomnia 05/17/2014  . Anxiety 05/17/2014  . Right tibial fracture 05/17/2014  . Esophageal reflux 05/17/2014  . Vitamin D deficiency 05/19/2014  . Elevated liver enzymes 05/19/2014  . Primary osteoarthritis of both knees 05/31/2014  . BPV (benign positional vertigo) 09/30/2014  . Fatty liver  disease, nonalcoholic 72/53/6644  . Abdominal aortic aneurysm ([place]) 10/21/2014  . LUQ pain 12/28/2014  . DDD (degenerative disc disease), cervical 08/08/2015  . Muscle spasms of head or neck 08/08/2015  . Notalgia 08/08/2015  . Morbid obesity ([place]) 10/13/2015  . Pre-diabetes 10/13/2015  . Hyperlipidemia 10/13/2015  . MRSA infection 01/28/2017  . Angina pectoris ([place])   . No energy 10/23/2017  . Uncontrolled type 2 diabetes mellitus with hyperglycemia ([place]) 11/21/2017  . Medication side effect 11/21/2017  . Low blood pressure reading 01/19/2018  . OSA (obstructive sleep apnea) 01/12/2019   Resolved Ambulatory Problems    Diagnosis Date Noted  . Abdominal pain, left upper quadrant 10/28/2014  . Diabetes mellitus type II, controlled ([place]) 12/28/2014  . Abdominal pain, right upper quadrant 07/25/2015  . Diarrhea 07/25/2015  . Bloating 07/25/2015  . Heaviness of upper extremity 11/28/2016  . Abscess of breast, right 01/16/2017  . Cellulitis of right breast 01/28/2017  . Abscess of right breast 01/28/2017   Past Medical History:  Diagnosis Date  . Anxiety state 05/17/2014  . Cancer [facility])    Reviewed med, allergy, problem list.      Observations/Objective: No acute distress.  Normal breathing.  Normal mood.   .. Today's Vitals   01/12/19 0943  Height: 5' (1.524 m)   Body mass index is 59.57 kg/m. .. Depression screen [facility] 2/9 01/12/2019 11/30/2018 10/21/2017  Decreased Interest 2 2 2   Down, Depressed, Hopeless 2  2 1  PHQ - 2 Score 4 4 3   Altered sleeping 3 2 2   Tired, decreased energy 0 2 2  Change in appetite 2 2 0  Feeling bad or failure about yourself  0 2 0  Trouble concentrating 0 1 0  Moving slowly or fidgety/restless 0 0 0  Suicidal thoughts 0 0 0  PHQ-9 Score 9 13 7   Difficult doing work/chores Somewhat difficult Very difficult Not difficult at all   .[name] Kitchen GAD 7 : Generalized Anxiety Score 01/12/2019 10/21/2017  Nervous, Anxious, on Edge 1 1  Control/stop  worrying 2 0  Worry too much - different things 0 0  Trouble relaxing 0 0  Restless 0 0  Easily annoyed or irritable 1 1  Afraid - awful might happen 1 1  Total GAD 7 Score 5 3  Anxiety Difficulty Not difficult at all Not difficult at all      Assessment and Plan:  [name] KitchenMarland KitchenMardee was seen today for sleep apnea and anxiety.  Diagnoses and all orders for this visit:  Anxiety -     ALPRAZolam (XANAX) 0.5 MG tablet; Take 1 tablet (0.5 mg total) by mouth at bedtime.  OSA (obstructive sleep apnea) -     AMBULATORY NON FORMULARY MEDICATION; Bilevel positive airway pressure (BiPAP) machine set at 15 cm/11cm of H2O pressure, with all supplemental supplies as needed.  Morbid obesity ([place])  follow up a1c in 3 months.   GAD has improved. Refilled xanax. Discussed goal would to not continue to take daily. Pt is working on other ways to help her anxiety.   Reorder bipap through [facility]. Same settings.   Follow Up Instructions:    I discussed the assessment and treatment plan with the patient. The patient was provided an opportunity to ask questions and all were answered. The patient agreed with the plan and demonstrated an understanding of the instructions.   The patient was advised to call back or seek an in-person evaluation if the symptoms worsen or if the condition fails to improve as anticipated.  I provided 25 minutes of non-face-to-face time during this encounter.   Iran Planas, PA-C

## 2019-01-12 NOTE — Telephone Encounter (Signed)
Called patient, she already has appt schedule today to discuss Xanax refill. Will discuss at scheduled appt.

## 2019-01-13 ENCOUNTER — Encounter: Payer: Self-pay | Admitting: Physician Assistant

## 2019-01-13 MED ORDER — AMBULATORY NON FORMULARY MEDICATION: 0 refills | Status: AC

## 2019-01-13 NOTE — Telephone Encounter (Signed)
Script printed and signed this morning.

## 2019-01-13 NOTE — Telephone Encounter (Signed)
RX and note faxed to [facility] at [phone] with confirmation received.

## 2019-01-21 DIAGNOSIS — G4733 Obstructive sleep apnea (adult) (pediatric): Secondary | ICD-10-CM | POA: Diagnosis not present

## 2019-02-21 DIAGNOSIS — G4733 Obstructive sleep apnea (adult) (pediatric): Secondary | ICD-10-CM | POA: Diagnosis not present

## 2019-02-24 DIAGNOSIS — G4733 Obstructive sleep apnea (adult) (pediatric): Secondary | ICD-10-CM | POA: Diagnosis not present

## 2019-03-23 DIAGNOSIS — G4733 Obstructive sleep apnea (adult) (pediatric): Secondary | ICD-10-CM | POA: Diagnosis not present

## 2019-04-12 ENCOUNTER — Other Ambulatory Visit: Payer: Self-pay | Admitting: Physician Assistant

## 2019-04-12 DIAGNOSIS — F419 Anxiety disorder, unspecified: Secondary | ICD-10-CM

## 2019-04-14 ENCOUNTER — Other Ambulatory Visit: Payer: Self-pay

## 2019-04-14 DIAGNOSIS — F419 Anxiety disorder, unspecified: Secondary | ICD-10-CM

## 2019-04-14 MED ORDER — ALPRAZOLAM 0.5 MG PO TABS: 0.5000 mg | ORAL_TABLET | Freq: Every day | ORAL | 0 refills | Status: DC

## 2019-04-14 NOTE — Telephone Encounter (Signed)
Nirvana needs a refill on Xanax. Dr Madilyn Fireman signed a refill this morning but it was printed. It needs to be sent electronically.

## 2019-04-14 NOTE — Telephone Encounter (Signed)
LVM to schedule f/u on A1C

## 2019-04-14 NOTE — Telephone Encounter (Signed)
OK. Needs f/u for her A1c. Please shedule here.

## 2019-04-19 ENCOUNTER — Ambulatory Visit (INDEPENDENT_AMBULATORY_CARE_PROVIDER_SITE_OTHER): Payer: Medicare Other | Admitting: Physician Assistant

## 2019-04-19 ENCOUNTER — Other Ambulatory Visit: Payer: Self-pay

## 2019-04-19 VITALS — BP 121/69 | HR 73 | Temp 98.5°F | Ht 60.0 in | Wt 306.0 lb

## 2019-04-19 DIAGNOSIS — Z1329 Encounter for screening for other suspected endocrine disorder: Secondary | ICD-10-CM

## 2019-04-19 DIAGNOSIS — E559 Vitamin D deficiency, unspecified: Secondary | ICD-10-CM

## 2019-04-19 DIAGNOSIS — E1165 Type 2 diabetes mellitus with hyperglycemia: Secondary | ICD-10-CM

## 2019-04-19 DIAGNOSIS — Z1231 Encounter for screening mammogram for malignant neoplasm of breast: Secondary | ICD-10-CM | POA: Diagnosis not present

## 2019-04-19 DIAGNOSIS — G4700 Insomnia, unspecified: Secondary | ICD-10-CM

## 2019-04-19 DIAGNOSIS — R5383 Other fatigue: Secondary | ICD-10-CM

## 2019-04-19 DIAGNOSIS — I10 Essential (primary) hypertension: Secondary | ICD-10-CM | POA: Diagnosis not present

## 2019-04-19 DIAGNOSIS — E782 Mixed hyperlipidemia: Secondary | ICD-10-CM | POA: Diagnosis not present

## 2019-04-19 LAB — POCT GLYCOSYLATED HEMOGLOBIN (HGB A1C): Hemoglobin A1C: 6.8 % — AB (ref 4.0–5.6)

## 2019-04-19 MED ORDER — GLUCOSE BLOOD VI STRP: ORAL_STRIP | 12 refills | Status: AC

## 2019-04-19 MED ORDER — TRAZODONE HCL 50 MG PO TABS: 50.0000 mg | ORAL_TABLET | Freq: Every day | ORAL | 2 refills | Status: DC

## 2019-04-19 MED ORDER — TELMISARTAN-HCTZ 40-12.5 MG PO TABS: ORAL_TABLET | ORAL | 3 refills | Status: DC

## 2019-04-19 MED ORDER — ATORVASTATIN CALCIUM 40 MG PO TABS: 40.0000 mg | ORAL_TABLET | Freq: Every day | ORAL | 3 refills | Status: DC

## 2019-04-19 MED ORDER — OZEMPIC (0.25 OR 0.5 MG/DOSE) 2 MG/1.5ML ~~LOC~~ SOPN: 0.5000 mg | PEN_INJECTOR | SUBCUTANEOUS | 2 refills | Status: DC

## 2019-04-19 MED ORDER — METOPROLOL SUCCINATE ER 25 MG PO TB24: 25.0000 mg | ORAL_TABLET | Freq: Every day | ORAL | 3 refills | Status: DC

## 2019-04-19 NOTE — Progress Notes (Signed)
Subjective:    Patient ID: Tamara Greer, female    DOB: Sep 16, 1965, 53 y.o.   MRN: [id number]  HPI  Pt is a 53 yo morbidly obese female with T2DM, AAA, HTN, OSA, HLD who presents to the clinic for 3 month follow up and medication refills.   DM- pt checks her sugars in the morning. Running about 150's. She needs another prescription for a meter she lost hers. She denies any CP, palpitations, headaches, open sores or wounds. She is taking ozempic at the .25mg  dose. She is able to tolerate it pretty well after a few months being on it. The nausea and GI side effects were difficult at first.   She is aware she needs eye exam and ask for referral.   She is sleeping well with trazodone.   .. Active Ambulatory Problems    Diagnosis Date Noted  . Endometrial cancer ([place]) 05/16/2014  . Essential hypertension 05/17/2014  . Insomnia 05/17/2014  . Anxiety 05/17/2014  . Right tibial fracture 05/17/2014  . Esophageal reflux 05/17/2014  . Vitamin D deficiency 05/19/2014  . Elevated liver enzymes 05/19/2014  . Primary osteoarthritis of both knees 05/31/2014  . BPV (benign positional vertigo) 09/30/2014  . Fatty liver disease, nonalcoholic 58/52/7782  . Abdominal aortic aneurysm ([place]) 10/21/2014  . LUQ pain 12/28/2014  . DDD (degenerative disc disease), cervical 08/08/2015  . Muscle spasms of head or neck 08/08/2015  . Notalgia 08/08/2015  . Morbidly obese ([place]) 10/13/2015  . Pre-diabetes 10/13/2015  . Hyperlipidemia 10/13/2015  . MRSA infection 01/28/2017  . Angina pectoris ([place])   . No energy 10/23/2017  . Uncontrolled type 2 diabetes mellitus with hyperglycemia ([place]) 11/21/2017  . Medication side effect 11/21/2017  . Low blood pressure reading 01/19/2018  . OSA (obstructive sleep apnea) 01/12/2019  . Vitamin D insufficiency 04/21/2019  . Thyroid disorder screening 04/21/2019   Resolved Ambulatory Problems    Diagnosis Date Noted  . Abdominal pain, left upper quadrant 10/28/2014   . Diabetes mellitus type II, controlled ([place]) 12/28/2014  . Abdominal pain, right upper quadrant 07/25/2015  . Diarrhea 07/25/2015  . Bloating 07/25/2015  . Heaviness of upper extremity 11/28/2016  . Abscess of breast, right 01/16/2017  . Cellulitis of right breast 01/28/2017  . Abscess of right breast 01/28/2017   Past Medical History:  Diagnosis Date  . Anxiety state 05/17/2014  . Cancer ([place])   . Essential hypertension, benign 05/17/2014  . Morbid obesity ([place]) 10/13/2015     Review of Systems  All other systems reviewed and are negative.      Objective:   Physical Exam Vitals signs reviewed.  Constitutional:      Appearance: Normal appearance. She is obese.  Cardiovascular:     Rate and Rhythm: Normal rate and regular rhythm.     Pulses: Normal pulses.  Pulmonary:     Effort: Pulmonary effort is normal.     Breath sounds: Normal breath sounds.  Neurological:     General: No focal deficit present.     Mental Status: She is alert and oriented to person, place, and time.  Psychiatric:        Mood and Affect: Mood normal.        Behavior: Behavior normal.        Thought Content: Thought content normal.           Assessment & Plan:   [name] KitchenMarland KitchenSuann was seen today for diabetes.  Diagnoses and all orders for this visit:  Controlled type  2 diabetes mellitus with hyperglycemia, without long-term current use of insulin (HCC) -     POCT glycosylated hemoglobin (Hb A1C) -     OZEMPIC, 0.25 OR 0.5 MG/DOSE, 2 MG/1.5ML SOPN; Inject 0.5 mg into the skin once a week. -     COMPLETE METABOLIC PANEL WITH GFR -     glucose blood test strip; To check glucose up to three times a day for Diabetes management. Dx code E11.65 -     Ambulatory referral to Ophthalmology  Essential hypertension -     metoprolol succinate (TOPROL-XL) 25 MG 24 hr tablet; Take 1 tablet (25 mg total) by mouth daily. -     telmisartan-hydrochlorothiazide (MICARDIS HCT) 40-12.5 MG tablet; TAKE 1 TABLET BY  MOUTH ONCE DAILY. -     COMPLETE METABOLIC PANEL WITH GFR  Visit for screening mammogram -     MM 3D SCREEN BREAST BILATERAL  Mixed hyperlipidemia -     atorvastatin (LIPITOR) 40 MG tablet; Take 1 tablet (40 mg total) by mouth daily. -     Lipid Panel w/reflex Direct LDL  Thyroid disorder screening -     TSH  Vitamin D insufficiency -     Vitamin D 1,25 dihydroxy  No energy -     B12 and Folate Panel  Morbidly obese (HCC)  Insomnia, unspecified type -     traZODone (DESYREL) 50 MG tablet; Take 1 tablet (50 mg total) by mouth at bedtime.   .. Depression screen [facility] 2/9 04/19/2019 01/12/2019 11/30/2018 10/21/2017  Decreased Interest 1 2 2 2   Down, Depressed, Hopeless 1 2 2 1   PHQ - 2 Score 2 4 4 3   Altered sleeping 2 3 2 2   Tired, decreased energy 1 0 2 2  Change in appetite 0 2 2 0  Feeling bad or failure about yourself  0 0 2 0  Trouble concentrating 0 0 1 0  Moving slowly or fidgety/restless 0 0 0 0  Suicidal thoughts 0 0 0 0  PHQ-9 Score 5 9 13 7   Difficult doing work/chores Not difficult at all Somewhat difficult Very difficult Not difficult at all    Lab Results  Component Value Date   HGBA1C 6.8 (A) 04/19/2019   A!C looks much better.  Increase ozempic to .5mg  weekly.  On statin. On ACE. Eye exam. Made referral.  Pt declined pneumonia and flu vaccine.   .. Diabetic Foot Exam - Simple   Simple Foot Form Diabetic Foot exam was performed with the following findings: Yes 04/19/2019 11:35 AM  Visual Inspection No deformities, no ulcerations, no other skin breakdown bilaterally: Yes Sensation Testing Intact to touch and monofilament testing bilaterally: Yes Pulse Check Posterior Tibialis and Dorsalis pulse intact bilaterally: Yes Comments     Needs labs.   Follow up in 3 months.

## 2019-04-20 NOTE — Progress Notes (Signed)
Cholesterol looks good. LDL goal is 70 but you are at 86.  Thyroid normal range.  Liver enzymes improved.  B12 still low normal. Are you taking this daily?

## 2019-04-21 ENCOUNTER — Encounter: Payer: Self-pay | Admitting: Physician Assistant

## 2019-04-21 DIAGNOSIS — E559 Vitamin D deficiency, unspecified: Secondary | ICD-10-CM | POA: Insufficient documentation

## 2019-04-21 DIAGNOSIS — Z1329 Encounter for screening for other suspected endocrine disorder: Secondary | ICD-10-CM | POA: Insufficient documentation

## 2019-04-22 LAB — COMPLETE METABOLIC PANEL WITH GFR
AG Ratio: 1.6 (calc) (ref 1.0–2.5)
ALT: 28 U/L (ref 6–29)
AST: 30 U/L (ref 10–35)
Albumin: 4.1 g/dL (ref 3.6–5.1)
Alkaline phosphatase (APISO): 92 U/L (ref 37–153)
BUN: 12 mg/dL (ref 7–25)
CO2: 26 mmol/L (ref 20–32)
Calcium: 9.3 mg/dL (ref 8.6–10.4)
Chloride: 103 mmol/L (ref 98–110)
Creat: 0.51 mg/dL (ref 0.50–1.05)
GFR, Est African American: 125 mL/min/{1.73_m2} (ref >=60)
GFR, Est Non African American: 108 mL/min/{1.73_m2} (ref >=60)
Globulin: 2.5 g/dL (calc) (ref 1.9–3.7)
Glucose, Bld: 123 mg/dL — ABNORMAL HIGH (ref 65–99)
Potassium: 4.2 mmol/L (ref 3.5–5.3)
Sodium: 138 mmol/L (ref 135–146)
Total Bilirubin: 0.7 mg/dL (ref 0.2–1.2)
Total Protein: 6.6 g/dL (ref 6.1–8.1)

## 2019-04-22 LAB — LIPID PANEL W/REFLEX DIRECT LDL
Cholesterol: 160 mg/dL (ref <200)
HDL: 51 mg/dL (ref >=50)
LDL Cholesterol (Calc): 86 mg/dL (calc)
Non-HDL Cholesterol (Calc): 109 mg/dL (calc) (ref <130)
Total CHOL/HDL Ratio: 3.1 (calc) (ref <5.0)
Triglycerides: 129 mg/dL (ref <150)

## 2019-04-22 LAB — B12 AND FOLATE PANEL
Folate: 5.6 ng/mL
Vitamin B-12: 330 pg/mL (ref 200–1100)

## 2019-04-22 LAB — VITAMIN D 1,25 DIHYDROXY
Vitamin D 1, 25 (OH)2 Total: 64 pg/mL (ref 18–72)
Vitamin D2 1, 25 (OH)2: <8 pg/mL
Vitamin D3 1, 25 (OH)2: 64 pg/mL

## 2019-04-22 LAB — TSH: TSH: 2.48 mIU/L

## 2019-04-23 DIAGNOSIS — G4733 Obstructive sleep apnea (adult) (pediatric): Secondary | ICD-10-CM | POA: Diagnosis not present

## 2019-05-13 ENCOUNTER — Other Ambulatory Visit: Payer: Self-pay

## 2019-05-13 ENCOUNTER — Ambulatory Visit (INDEPENDENT_AMBULATORY_CARE_PROVIDER_SITE_OTHER): Payer: Medicare Other

## 2019-05-13 DIAGNOSIS — Z1231 Encounter for screening mammogram for malignant neoplasm of breast: Secondary | ICD-10-CM | POA: Diagnosis not present

## 2019-05-14 NOTE — Progress Notes (Signed)
Normal mammogram. Follow up in 1 year.

## 2019-05-17 ENCOUNTER — Ambulatory Visit: Payer: BLUE CROSS/BLUE SHIELD | Admitting: Physician Assistant

## 2019-05-24 DIAGNOSIS — G4733 Obstructive sleep apnea (adult) (pediatric): Secondary | ICD-10-CM | POA: Diagnosis not present

## 2019-05-27 DIAGNOSIS — G4733 Obstructive sleep apnea (adult) (pediatric): Secondary | ICD-10-CM | POA: Diagnosis not present

## 2019-07-17 ENCOUNTER — Other Ambulatory Visit: Payer: Self-pay | Admitting: Osteopathic Medicine

## 2019-07-17 DIAGNOSIS — F419 Anxiety disorder, unspecified: Secondary | ICD-10-CM

## 2019-07-17 NOTE — Telephone Encounter (Signed)
Forwarding medication refill request to PCP for review. 

## 2019-10-11 ENCOUNTER — Ambulatory Visit: Payer: Medicare Other | Admitting: Physician Assistant

## 2019-10-12 ENCOUNTER — Ambulatory Visit: Payer: Medicare Other | Admitting: Sports Medicine

## 2019-10-18 ENCOUNTER — Other Ambulatory Visit: Payer: Self-pay | Admitting: Physician Assistant

## 2019-10-18 DIAGNOSIS — F419 Anxiety disorder, unspecified: Secondary | ICD-10-CM

## 2019-11-09 ENCOUNTER — Telehealth: Payer: Medicare Other | Admitting: Physician Assistant

## 2019-11-09 NOTE — Progress Notes (Unsigned)
{Choose 1 Note Type (Telehealth Visit or Telephone Visit):[phone]}  Evaluation Performed:  Follow-up visit  This visit type was conducted due to national recommendations for restrictions regarding the COVID-19 Pandemic (e.g. social distancing).  This format is felt to be most appropriate for this patient at this time.  All issues noted in this document were discussed and addressed.  No physical exam was performed (except for noted visual exam findings with Video Visits).  Please refer to the patient's chart (MyChart message for video visits and phone note for telephone visits) for the patient's consent to telehealth for [facility]  Date:  11/09/2019   ID:  Tamara Greer, DOB February 03, 1966, MRN [id number]  Patient Location:  ***  Provider location:   ***  PCP:  Donella Stade, PA-C  Cardiologist:  Skeet Latch, MD 08/01/2017 Electrophysiologist:  None   Chief Complaint:  ***  History of Present Illness:    Tamara Greer is a 54 y.o. female who presents via audio/video conferencing for a telehealth visit today.    54 y.o. yo female who has a hx of AAA, non-obs CAD at cath 05/2017, DM, HTN, HLD, GERD, endometrial CA, palpitations (has not worn monitor)  ***   The patient {does/does not:200015} have symptoms concerning for COVID-19 infection (fever, chills, cough, or new shortness of breath).    Prior CV studies:   The following studies were reviewed today:  LHC 05/19/17:  Mid RCA lesion, 25 %stenosed.  The left ventricular systolic function is normal.  LV end diastolic pressure is moderately elevated.  The left ventricular ejection fraction is 55-65% by visual estimate.  There is no aortic valve stenosis.  Past Medical History:  Diagnosis Date  . Abdominal aortic aneurysm ([place]) 10/21/2014   (CT June 2016 via [facility] contests this Dx) 3.7cm. Asymptomatic. Follow up ultrasound in 2 years.  U/S 07/2015 stable. Managed by surgeon.    . Abdominal pain, left  upper quadrant 10/28/2014  . Abdominal pain, right upper quadrant 07/25/2015  . Abscess of breast, right 01/16/2017  . Abscess of right breast 01/28/2017  . Anxiety state 05/17/2014  . Bloating 07/25/2015  . BPV (benign positional vertigo) 09/30/2014  . Cancer ([place])   . Cellulitis of right breast 01/28/2017  . DDD (degenerative disc disease), cervical 08/08/2015  . Diabetes mellitus type II, controlled ([place]) 12/28/2014  . Diarrhea 07/25/2015  . Elevated liver enzymes 05/19/2014  . Endometrial cancer ([place]) 05/16/2014   2010 surgery hysterectomy. Remission.    . Esophageal reflux 05/17/2014   7 years endoscopy normal.    . Essential hypertension, benign 05/17/2014  . Fatty liver disease, nonalcoholic 0000000  . Heaviness of upper extremity 11/28/2016  . Hyperlipidemia 10/13/2015  . Insomnia 05/17/2014  . LUQ pain 12/28/2014  . Morbid obesity ([place]) 10/13/2015  . MRSA infection 01/28/2017  . Muscle spasms of head or neck 08/08/2015  . Notalgia 08/08/2015  . Pre-diabetes 10/13/2015  . Primary osteoarthritis of both knees 05/31/2014  . Right tibial fracture 05/17/2014   Repaired in 2014. Continues to walk with cane.    . Vitamin D deficiency 05/19/2014   Past Surgical History:  Procedure Laterality Date  . ABDOMINAL HYSTERECTOMY    . LEFT HEART CATH AND CORONARY ANGIOGRAPHY N/A 05/19/2017   Procedure: LEFT HEART CATH AND CORONARY ANGIOGRAPHY;  Surgeon: Jettie Booze, MD;  Location: [facility];  Service: Cardiovascular;  Laterality: N/A;  . ORTHOPEDIC SURGERY     steel rod placed in tibia  No outpatient medications have been marked as taking for the 11/09/19 encounter (Appointment) with Alizey Noren, Evelene Croon, PA-C.     Allergies:   Dexlansoprazole, Lexapro [escitalopram], Metformin, Metformin and related, Xigduo xr [dapagliflozin-metformin hcl er], and Diclofenac   Social History   Tobacco Use  . Smoking status: Never Smoker  . Smokeless tobacco: Never Used  Substance Use Topics  .  Alcohol use: No  . Drug use: No     Family Hx: The patient's family history includes COPD in her father; Diabetes in her mother; Heart failure in her father and mother; Stroke in her mother.  ROS:   Please see the history of present illness.    All other systems reviewed and are negative.   Labs/Other Tests and Data Reviewed:    Recent Labs: 04/19/2019: ALT 28; BUN 12; Creat 0.51; Potassium 4.2; Sodium 138; TSH 2.48   CBC    Component Value Date/Time   WBC 6.4 05/09/2017 1056   WBC 7.2 11/26/2016 1459   RBC 4.09 05/09/2017 1056   RBC 4.29 11/26/2016 1459   HGB 12.8 05/09/2017 1056   HCT 37.3 05/09/2017 1056   PLT 202 05/09/2017 1056   MCV 91 05/09/2017 1056   MCH 31.3 05/09/2017 1056   MCH 30.1 11/26/2016 1459   MCHC 34.3 05/09/2017 1056   MCHC 32.6 11/26/2016 1459   RDW 13.1 05/09/2017 1056   LYMPHSABS 1.9 05/09/2017 1056   MONOABS 0.5 06/19/2015 1139   EOSABS 0.1 05/09/2017 1056   BASOSABS 0.0 05/09/2017 1056    CMP Latest Ref Rng & Units 04/19/2019 05/09/2017 11/26/2016  Glucose 65 - 99 mg/dL 123(H) 187(H) 166(H)  BUN 7 - 25 mg/dL 12 16 21   Creatinine 0.50 - 1.05 mg/dL 0.51 0.59 0.64  Sodium 135 - 146 mmol/L 138 136 135  Potassium 3.5 - 5.3 mmol/L 4.2 4.5 4.6  Chloride 98 - 110 mmol/L 103 98 100  CO2 20 - 32 mmol/L 26 22 22   Calcium 8.6 - 10.4 mg/dL 9.3 9.6 9.3  Total Protein 6.1 - 8.1 g/dL 6.6 - 7.0  Total Bilirubin 0.2 - 1.2 mg/dL 0.7 - 0.7  Alkaline Phos 33 - 130 U/L - - 96  AST 10 - 35 U/L 30 - 49(H)  ALT 6 - 29 U/L 28 - 48(H)     Recent Lipid Panel Lab Results  Component Value Date/Time   CHOL 160 04/19/2019 12:12 PM   TRIG 129 04/19/2019 12:12 PM   HDL 51 04/19/2019 12:12 PM   CHOLHDL 3.1 04/19/2019 12:12 PM   LDLCALC 86 04/19/2019 12:12 PM    Wt Readings from Last 3 Encounters:  04/19/19 (!) 306 lb (138.8 kg)  01/19/18 (!) 305 lb (138.3 kg)  12/24/17 (!) 310 lb (140.6 kg)     Objective:    Vital Signs:  There were no vitals taken for this  visit.   54 y.o. female in no*** acute distress.   ASSESSMENT & PLAN:    1.  ***  COVID-19 Education: The signs and symptoms of COVID-19 were discussed with the patient and how to seek care for testing (follow up with PCP or arrange E-visit).  The importance of social distancing was discussed today.  Patient Risk:   After full review of this patient's clinical status, I feel that they are at least moderate risk at this time.  Time:   Today, I have spent *** minutes with the patient with telehealth technology discussing ***.     Medication Adjustments/Labs and Tests Ordered:  Current medicines are reviewed at length with the patient today.  Concerns regarding medicines are outlined above.  Tests Ordered: No orders of the defined types were placed in this encounter.  Medication Changes: No orders of the defined types were placed in this encounter.   Disposition:  Follow up with Skeet Latch, MD   Signed, Rosaria Ferries, PA-C  11/09/2019 11:58 AM    [place]

## 2019-11-14 NOTE — Progress Notes (Addendum)
Virtual Visit via Telephone Note   This visit type was conducted due to national recommendations for restrictions regarding the COVID-19 Pandemic (e.g. social distancing) in an effort to limit this patient's exposure and mitigate transmission in our community.  Due to her co-morbid illnesses, this patient is at least at moderate risk for complications without adequate follow up.  This format is felt to be most appropriate for this patient at this time.  The patient did not have access to video technology/had technical difficulties with video requiring transitioning to audio format only (telephone).  All issues noted in this document were discussed and addressed.  No physical exam could be performed with this format.  Please refer to the patient's chart for her  consent to telehealth for [facility].   The patient was identified using 2 identifiers.  Date:  11/15/2019   ID:  Tamara Greer, DOB 1966/07/07, MRN [id number]  Patient Location: Home Provider Location: Home  PCP:  Donella Stade, PA-C  Cardiologist:  Skeet Latch, MD  Electrophysiologist:  None   Evaluation Performed:  Follow-Up Visit  Chief Complaint:  Yearly follow up  History of Present Illness:    Tamara Greer is a 54 y.o. 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 She was initailly seen by Dr. Oval Linsey for chest pain which lead to heart catheterization (she refused stress testing due to prior traumatic event). LHC revealed nonobstructive CAD.   She presents todayy for routine follow up, but also for dyspnea. She reports SOB for the past month. Reports DOE and SOB at rest. Her left leg is swelling greater than right leg. She denies orthopnea, but does report abdominal fullness that has corresponded with leg swelling. She has not gained weight from calories, has actually lost weight on ozempic. She has lost 24 lbs in the  past 3 months. Swelling worsens with sitting. Swelling is better in the morning. No chest pain, palpitations, or syncope. Upon further questioning, has been eating canned soup daily.   The patient does not have symptoms concerning for COVID-19 infection (fever, chills, cough, or new shortness of breath).    Past Medical History:  Diagnosis Date  . Abdominal aortic aneurysm ([place]) 10/21/2014   (CT June 2016 via [facility] contests this Dx) 3.7cm. Asymptomatic. Follow up ultrasound in 2 years.  U/S 07/2015 stable. Managed by surgeon.    . Abdominal pain, left upper quadrant 10/28/2014  . Abdominal pain, right upper quadrant 07/25/2015  . Abscess of breast, right 01/16/2017  . Abscess of right breast 01/28/2017  . Anxiety state 05/17/2014  . Bloating 07/25/2015  . BPV (benign positional vertigo) 09/30/2014  . Cancer ([place])   . Cellulitis of right breast 01/28/2017  . DDD (degenerative disc disease), cervical 08/08/2015  . Diabetes mellitus type II, controlled ([place]) 12/28/2014  . Diarrhea 07/25/2015  . Elevated liver enzymes 05/19/2014  . Endometrial cancer ([place]) 05/16/2014   2010 surgery hysterectomy. Remission.    . Esophageal reflux 05/17/2014   7 years endoscopy normal.    . Essential hypertension, benign 05/17/2014  . Fatty liver disease, nonalcoholic 0000000  . Heaviness of upper extremity 11/28/2016  . Hyperlipidemia 10/13/2015  . Insomnia 05/17/2014  . LUQ pain 12/28/2014  . Morbid obesity ([place]) 10/13/2015  . MRSA infection 01/28/2017  . Muscle spasms of head or neck 08/08/2015  . Notalgia 08/08/2015  . Pre-diabetes 10/13/2015  . Primary osteoarthritis of both knees 05/31/2014  .  Right tibial fracture 05/17/2014   Repaired in 2014. Continues to walk with cane.    . Vitamin D deficiency 05/19/2014   Past Surgical History:  Procedure Laterality Date  . ABDOMINAL HYSTERECTOMY    . LEFT HEART CATH AND CORONARY ANGIOGRAPHY N/A 05/19/2017   Procedure: LEFT HEART CATH AND CORONARY ANGIOGRAPHY;  Surgeon:  Jettie Booze, MD;  Location: [facility];  Service: Cardiovascular;  Laterality: N/A;  . ORTHOPEDIC SURGERY     steel rod placed in tibia     Current Meds  Medication Sig  . acetaminophen (TYLENOL) 500 MG tablet Take 500 mg by mouth daily as needed for mild pain or headache.  . ALPRAZolam (XANAX) 0.5 MG tablet TAKE 1 TABLET BY MOUTH AT BEDTIME  . AMBULATORY NON FORMULARY MEDICATION Glucometer, test strips, and lancets. Test blood glucose twice daily.  Please fill what insurance prefers. Dx; E11.9  . AMBULATORY NON FORMULARY MEDICATION Bilevel positive airway pressure (BiPAP) machine set at 15 cm/11cm of H2O pressure, with all supplemental supplies as needed.  [name] Kitchen aspirin EC 81 MG tablet Take 81 mg by mouth daily.  [name] Kitchen atorvastatin (LIPITOR) 40 MG tablet Take 1 tablet (40 mg total) by mouth daily.  [name] Kitchen glucose blood test strip To check glucose up to three times a day for Diabetes management. Dx code E11.65  . metoprolol succinate (TOPROL-XL) 25 MG 24 hr tablet Take 1 tablet (25 mg total) by mouth daily.  . ONE TOUCH ULTRA TEST test strip USE 1 STRIP TO CHECK GLUCOSE TWICE DAILY  . OZEMPIC, 0.25 OR 0.5 MG/DOSE, 2 MG/1.5ML SOPN Inject 0.5 mg into the skin once a week.  . pantoprazole (PROTONIX) 40 MG tablet Take 1 tablet (40 mg total) by mouth daily.  [name] Kitchen telmisartan-hydrochlorothiazide (MICARDIS HCT) 40-12.5 MG tablet TAKE 1 TABLET BY MOUTH ONCE DAILY.  . traZODone (DESYREL) 50 MG tablet Take 1 tablet (50 mg total) by mouth at bedtime.     Allergies:   Dexlansoprazole, Lexapro [escitalopram], Metformin, Metformin and related, Xigduo xr [dapagliflozin-metformin hcl er], and Diclofenac   Social History   Tobacco Use  . Smoking status: Never Smoker  . Smokeless tobacco: Never Used  Substance Use Topics  . Alcohol use: No  . Drug use: No     Family Hx: The patient's family history includes COPD in her father; Diabetes in her mother; Heart failure in her father and mother; Stroke  in her mother.  ROS:   Please see the history of present illness.     All other systems reviewed and are negative.   Prior CV studies:   The following studies were reviewed today:  Left heart cath 2018:  Mid RCA lesion, 25 %stenosed.  The left ventricular systolic function is normal.  LV end diastolic pressure is moderately elevated.  The left ventricular ejection fraction is 55-65% by visual estimate.  There is no aortic valve stenosis.   Nonobstructive coronary artery disease.  Continue aggressive preventive therapy.  Labs/Other Tests and Data Reviewed:    EKG:  No ECG reviewed.  Recent Labs: 04/19/2019: ALT 28; BUN 12; Creat 0.51; Potassium 4.2; Sodium 138; TSH 2.48   Recent Lipid Panel Lab Results  Component Value Date/Time   CHOL 160 04/19/2019 12:12 PM   TRIG 129 04/19/2019 12:12 PM   HDL 51 04/19/2019 12:12 PM   CHOLHDL 3.1 04/19/2019 12:12 PM   LDLCALC 86 04/19/2019 12:12 PM    Wt Readings from Last 3 Encounters:  04/19/19 (!) 306 lb (138.8  kg)  01/19/18 (!) 305 lb (138.3 kg)  12/24/17 (!) 310 lb (140.6 kg)     Objective:    Vital Signs:  Ht 5' (1.524 m)   BMI 59.76 kg/m    VITAL SIGNS:  reviewed GEN:  no acute distress RESPIRATORY:  respirations unlabored NEURO:  alert and oriented x 3, no obvious focal deficit PSYCH:  normal affect  ASSESSMENT & PLAN:    Lower extremity swelling - no recent echo - will add 20 mg lasix and obtain echocardiogram - states her pressure is generally on the low side - so started with low dose lasix - recommended compression socks - will collect BMP when she comes for echo  - will see her back in 2 weeks for virtual visit to assess response to lasix - have also asked her to wear compression socks and weight daily   Hypertension - on torpol 25 mg daily, telmisartan-HCTZ 40-12.5 mg daily   Hyperlipidemia 04/19/2019: Cholesterol 160; HDL 51; LDL Cholesterol (Calc) 86; Triglycerides 129 - continue 40 mg  lipitor   Nonobstructive CAD - risk factor modificaiton - continue ASA and statin, BB   OSA on BiPAP - has been compliant   Echo and BMP. Follow up virtually on the 26th.   COVID-19 Education: The signs and symptoms of COVID-19 were discussed with the patient and how to seek care for testing (follow up with PCP or arrange E-visit).  The importance of social distancing was discussed today.  Time:   Today, I have spent 21 minutes with the patient with telehealth technology discussing the above problems.     Medication Adjustments/Labs and Tests Ordered: Current medicines are reviewed at length with the patient today.  Concerns regarding medicines are outlined above.   Tests Ordered: Orders Placed This Encounter  Procedures  . ECHOCARDIOGRAM COMPLETE    Medication Changes: No orders of the defined types were placed in this encounter.   Follow Up:  Virtual Visit  in 2 week(s)  Signed, Ledora Bottcher, PA  11/15/2019 11:10 AM    [place]

## 2019-11-15 ENCOUNTER — Encounter: Payer: Self-pay | Admitting: Physician Assistant

## 2019-11-15 ENCOUNTER — Telehealth (INDEPENDENT_AMBULATORY_CARE_PROVIDER_SITE_OTHER): Payer: Medicare Other | Admitting: Physician Assistant

## 2019-11-15 VITALS — Ht 60.0 in

## 2019-11-15 DIAGNOSIS — G4733 Obstructive sleep apnea (adult) (pediatric): Secondary | ICD-10-CM | POA: Diagnosis not present

## 2019-11-15 DIAGNOSIS — M7989 Other specified soft tissue disorders: Secondary | ICD-10-CM

## 2019-11-15 DIAGNOSIS — I10 Essential (primary) hypertension: Secondary | ICD-10-CM | POA: Diagnosis not present

## 2019-11-15 DIAGNOSIS — E785 Hyperlipidemia, unspecified: Secondary | ICD-10-CM | POA: Diagnosis not present

## 2019-11-15 DIAGNOSIS — Z9989 Dependence on other enabling machines and devices: Secondary | ICD-10-CM

## 2019-11-15 MED ORDER — FUROSEMIDE 20 MG PO TABS: 20.0000 mg | ORAL_TABLET | Freq: Every day | ORAL | 3 refills | Status: DC

## 2019-11-15 NOTE — Patient Instructions (Signed)
Medication Instructions:  Take 20 mg of Lasix (Furosemide) in the mornings. *If you need a refill on your cardiac medications before your next appointment, please call your pharmacy*   Lab Work: Your physician recommends that you return for lab work (BMET) on the same day as your echo. Take lab slip with you to echo appointment.  If you have labs (blood work) drawn today and your tests are completely normal, you will receive your results only by: [name] Kitchen MyChart Message (if you have MyChart) OR . A paper copy in the mail If you have any lab test that is abnormal or we need to change your treatment, we will call you to review the results.   Testing/Procedures: Your physician has requested that you have an echocardiogram. Echocardiography is a painless test that uses sound waves to create images of your heart. It provides your doctor with information about the size and shape of your heart and how well your heart's chambers and valves are working. This procedure takes approximately one hour. There are no restrictions for this procedure. Our office will contact you to schedule this.    Follow-Up: At [facility], you and your health needs are our priority.  As part of our continuing mission to provide you with exceptional heart care, we have created designated Provider Care Teams.  These Care Teams include your primary Cardiologist (physician) and Advanced Practice Providers (APPs -  Physician Assistants and Nurse Practitioners) who all work together to provide you with the care you need, when you need it.  We recommend signing up for the patient portal called "MyChart".  Sign up information is provided on this After Visit Summary.  MyChart is used to connect with patients for Virtual Visits (Telemedicine).  Patients are able to view lab/test results, encounter notes, upcoming appointments, etc.  Non-urgent messages can be sent to your provider as well.   To learn more about what you can do with  MyChart, go to [email or web address].    Your next appointment:   Friday, 11/26/19 at 11:15 AM.  The format for your next appointment:   Virtual Visit   Provider:   Fabian Sharp, PA-C   Other Instructions Your physician recommends that you weigh, daily, at the same time every day, and in the same amount of clothing. Please record your daily weights on the handout provided and bring it to your next appointment.   Please purchase compression stockings to help with swelling.   Low-Sodium Eating Plan Sodium, which is an element that makes up salt, helps you maintain a healthy balance of fluids in your body. Too much sodium can increase your blood pressure and cause fluid and waste to be held in your body. Your health care provider or dietitian may recommend following this plan if you have high blood pressure (hypertension), kidney disease, liver disease, or heart failure. Eating less sodium can help lower your blood pressure, reduce swelling, and protect your heart, liver, and kidneys. What are tips for following this plan? General guidelines  Most people on this plan should limit their sodium intake to 1,500-2,000 mg (milligrams) of sodium each day. Reading food labels   The Nutrition Facts label lists the amount of sodium in one serving of the food. If you eat more than one serving, you must multiply the listed amount of sodium by the number of servings.  Choose foods with less than 140 mg of sodium per serving.  Avoid foods with 300 mg of sodium or more per serving. Shopping  Look for lower-sodium products, often labeled as "low-sodium" or "no salt added."  Always check the sodium content even if foods are labeled as "unsalted" or "no salt added".  Buy fresh foods. ? Avoid canned foods and premade or frozen meals. ? Avoid canned, cured, or processed meats  Buy breads that have less than 80 mg of sodium per slice. Cooking  Eat more home-cooked food and less restaurant,  buffet, and fast food.  Avoid adding salt when cooking. Use salt-free seasonings or herbs instead of table salt or sea salt. Check with your health care provider or pharmacist before using salt substitutes.  Cook with plant-based oils, such as canola, sunflower, or olive oil. Meal planning  When eating at a restaurant, ask that your food be prepared with less salt or no salt, if possible.  Avoid foods that contain MSG (monosodium glutamate). MSG is sometimes added to [place] food, bouillon, and some canned foods. What foods are recommended? The items listed may not be a complete list. Talk with your dietitian about what dietary choices are best for you. Grains Low-sodium cereals, including oats, puffed wheat and rice, and shredded wheat. Low-sodium crackers. Unsalted rice. Unsalted pasta. Low-sodium bread. Whole-grain breads and whole-grain pasta. Vegetables Fresh or frozen vegetables. "No salt added" canned vegetables. "No salt added" tomato sauce and paste. Low-sodium or reduced-sodium tomato and vegetable juice. Fruits Fresh, frozen, or canned fruit. Fruit juice. Meats and other protein foods Fresh or frozen (no salt added) meat, poultry, seafood, and fish. Low-sodium canned tuna and salmon. Unsalted nuts. Dried peas, beans, and lentils without added salt. Unsalted canned beans. Eggs. Unsalted nut butters. Dairy Milk. Soy milk. Cheese that is naturally low in sodium, such as ricotta cheese, fresh mozzarella, or Swiss cheese Low-sodium or reduced-sodium cheese. Cream cheese. Yogurt. Fats and oils Unsalted butter. Unsalted margarine with no trans fat. Vegetable oils such as canola or olive oils. Seasonings and other foods Fresh and dried herbs and spices. Salt-free seasonings. Low-sodium mustard and ketchup. Sodium-free salad dressing. Sodium-free light mayonnaise. Fresh or refrigerated horseradish. Lemon juice. Vinegar. Homemade, reduced-sodium, or low-sodium soups. Unsalted popcorn and  pretzels. Low-salt or salt-free chips. What foods are not recommended? The items listed may not be a complete list. Talk with your dietitian about what dietary choices are best for you. Grains Instant hot cereals. Bread stuffing, pancake, and biscuit mixes. Croutons. Seasoned rice or pasta mixes. Noodle soup cups. Boxed or frozen macaroni and cheese. Regular salted crackers. Self-rising flour. Vegetables Sauerkraut, pickled vegetables, and relishes. Olives. [place] fries. Onion rings. Regular canned vegetables (not low-sodium or reduced-sodium). Regular canned tomato sauce and paste (not low-sodium or reduced-sodium). Regular tomato and vegetable juice (not low-sodium or reduced-sodium). Frozen vegetables in sauces. Meats and other protein foods Meat or fish that is salted, canned, smoked, spiced, or pickled. Bacon, ham, sausage, hotdogs, corned beef, chipped beef, packaged lunch meats, salt pork, jerky, pickled herring, anchovies, regular canned tuna, sardines, salted nuts. Dairy Processed cheese and cheese spreads. Cheese curds. Blue cheese. Feta cheese. String cheese. Regular cottage cheese. Buttermilk. Canned milk. Fats and oils Salted butter. Regular margarine. Ghee. Bacon fat. Seasonings and other foods Onion salt, garlic salt, seasoned salt, table salt, and sea salt. Canned and packaged gravies. Worcestershire sauce. Tartar sauce. Barbecue sauce. Teriyaki sauce. Soy sauce, including reduced-sodium. Steak sauce. Fish sauce. Oyster sauce. Cocktail sauce. Horseradish that you find on the shelf. Regular ketchup and mustard. Meat flavorings and tenderizers. Bouillon cubes. Hot sauce and Tabasco sauce. Premade or packaged marinades.  Premade or packaged taco seasonings. Relishes. Regular salad dressings. Salsa. Potato and tortilla chips. Corn chips and puffs. Salted popcorn and pretzels. Canned or dried soups. Pizza. Frozen entrees and pot pies. Summary  Eating less sodium can help lower your blood  pressure, reduce swelling, and protect your heart, liver, and kidneys.  Most people on this plan should limit their sodium intake to 1,500-2,000 mg (milligrams) of sodium each day.  Canned, boxed, and frozen foods are high in sodium. Restaurant foods, fast foods, and pizza are also very high in sodium. You also get sodium by adding salt to food.  Try to cook at home, eat more fresh fruits and vegetables, and eat less fast food, canned, processed, or prepared foods. This information is not intended to replace advice given to you by your health care provider. Make sure you discuss any questions you have with your health care provider. Document Revised: 08/01/2017 Document Reviewed: 08/12/2016 Elsevier Patient Education  2020 [employer].

## 2019-11-15 NOTE — Addendum Note (Signed)
Addended by: Therisa Doyne on: 11/15/2019 11:30 AM   Modules accepted: Orders

## 2019-11-15 NOTE — Addendum Note (Signed)
Addended by: Minette Brine on: 11/15/2019 11:11 AM   Modules accepted: Orders

## 2019-11-23 NOTE — Progress Notes (Deleted)
{Choose 1 Note Type (Video or Telephone):[phone]}   The patient was identified using 2 identifiers.  Date:  11/23/2019   ID:  Tamara Greer, DOB Aug 07, 1966, MRN [id number]  {Patient Location:[phone]::"Home"} {Provider [phone]::"Home"}  PCP:  Donella Stade, PA-C  Cardiologist:  Skeet Latch, MD *** Electrophysiologist:  None   Evaluation Performed:  {Choose Visit [id number] Visit"}  Chief Complaint:  ***  History of Present Illness:    Tamara Greer is a 54 y.o. 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 She was initailly seen by Dr. Oval Linsey for chest pain which lead to heart catheterization (she refused stress testing due to prior traumatic event). LHC revealed nonobstructive CAD. I saw her 11/15/19 with complaints of SOB and lower extremity swelling. I added lasix and ordered an echo and follow up BMP. Echo not yet completed.   She returns today for follow up.    Needs BMP today Also needs fasting lipids   Lower extremity swelling - compression socks - daily weights - 20 mg lasix   Hypertension  - BB, telmisartan-HCTZ 40-12.5   Hyperlipidemia 04/19/2019: Cholesterol 160; HDL 51; LDL Cholesterol (Calc) 86; Triglycerides 129 - continue 40 mg lipitor   Nonobstructive CAD by heart cath 2018 - ASA, statin, BB - risk factor modification      The patient {does/does not:200015} have symptoms concerning for COVID-19 infection (fever, chills, cough, or new shortness of breath).    Past Medical History:  Diagnosis Date  . Abdominal aortic aneurysm ([place]) 10/21/2014   (CT June 2016 via [facility] contests this Dx) 3.7cm. Asymptomatic. Follow up ultrasound in 2 years.  U/S 07/2015 stable. Managed by surgeon.    . Abdominal pain, left upper quadrant 10/28/2014  . Abdominal pain, right upper quadrant 07/25/2015  . Abscess of breast, right  01/16/2017  . Abscess of right breast 01/28/2017  . Anxiety state 05/17/2014  . Bloating 07/25/2015  . BPV (benign positional vertigo) 09/30/2014  . Cancer ([place])   . Cellulitis of right breast 01/28/2017  . DDD (degenerative disc disease), cervical 08/08/2015  . Diabetes mellitus type II, controlled ([place]) 12/28/2014  . Diarrhea 07/25/2015  . Elevated liver enzymes 05/19/2014  . Endometrial cancer ([place]) 05/16/2014   2010 surgery hysterectomy. Remission.    . Esophageal reflux 05/17/2014   7 years endoscopy normal.    . Essential hypertension, benign 05/17/2014  . Fatty liver disease, nonalcoholic 0000000  . Heaviness of upper extremity 11/28/2016  . Hyperlipidemia 10/13/2015  . Insomnia 05/17/2014  . LUQ pain 12/28/2014  . Morbid obesity ([place]) 10/13/2015  . MRSA infection 01/28/2017  . Muscle spasms of head or neck 08/08/2015  . Notalgia 08/08/2015  . Pre-diabetes 10/13/2015  . Primary osteoarthritis of both knees 05/31/2014  . Right tibial fracture 05/17/2014   Repaired in 2014. Continues to walk with cane.    . Vitamin D deficiency 05/19/2014   Past Surgical History:  Procedure Laterality Date  . ABDOMINAL HYSTERECTOMY    . LEFT HEART CATH AND CORONARY ANGIOGRAPHY N/A 05/19/2017   Procedure: LEFT HEART CATH AND CORONARY ANGIOGRAPHY;  Surgeon: Jettie Booze, MD;  Location: [facility];  Service: Cardiovascular;  Laterality: N/A;  . ORTHOPEDIC SURGERY     steel rod placed in tibia     No outpatient medications have been marked as taking for the 11/26/19 encounter (Appointment) with Ledora Bottcher, [place].     Allergies:   Dexlansoprazole,  Lexapro [escitalopram], Metformin, Metformin and related, Xigduo xr [dapagliflozin-metformin hcl er], and Diclofenac   Social History   Tobacco Use  . Smoking status: Never Smoker  . Smokeless tobacco: Never Used  Substance Use Topics  . Alcohol use: No  . Drug use: No     Family Hx: The patient's family history includes COPD in her  father; Diabetes in her mother; Heart failure in her father and mother; Stroke in her mother.  ROS:   Please see the history of present illness.    *** All other systems reviewed and are negative.   Prior CV studies:   The following studies were reviewed today:  ***  Labs/Other Tests and Data Reviewed:    EKG:  {EKG/Telemetry Strips Reviewed:[id number]}  Recent Labs: 04/19/2019: ALT 28; BUN 12; Creat 0.51; Potassium 4.2; Sodium 138; TSH 2.48   Recent Lipid Panel Lab Results  Component Value Date/Time   CHOL 160 04/19/2019 12:12 PM   TRIG 129 04/19/2019 12:12 PM   HDL 51 04/19/2019 12:12 PM   CHOLHDL 3.1 04/19/2019 12:12 PM   LDLCALC 86 04/19/2019 12:12 PM    Wt Readings from Last 3 Encounters:  04/19/19 (!) 306 lb (138.8 kg)  01/19/18 (!) 305 lb (138.3 kg)  12/24/17 (!) 310 lb (140.6 kg)     Objective:    Vital Signs:  There were no vitals taken for this visit.   {Heart[facility] Virtual Exam (Optional):[phone]::"VITAL SIGNS:  reviewed"}  ASSESSMENT & PLAN:    1. ***  COVID-19 Education: The signs and symptoms of COVID-19 were discussed with the patient and how to seek care for testing (follow up with PCP or arrange E-visit).  ***The importance of social distancing was discussed today.  Time:   Today, I have spent *** minutes with the patient with telehealth technology discussing the above problems.     Medication Adjustments/Labs and Tests Ordered: Current medicines are reviewed at length with the patient today.  Concerns regarding medicines are outlined above.   Tests Ordered: No orders of the defined types were placed in this encounter.   Medication Changes: No orders of the defined types were placed in this encounter.   Follow Up:  {F/U Format:[phone]} {follow up:15908}  Signed, Ledora Bottcher, PA  11/23/2019 3:09 PM    [facility]

## 2019-11-26 ENCOUNTER — Telehealth: Payer: Medicare Other | Admitting: Physician Assistant

## 2019-12-01 ENCOUNTER — Other Ambulatory Visit: Payer: Medicare Other

## 2019-12-01 ENCOUNTER — Other Ambulatory Visit (HOSPITAL_COMMUNITY): Payer: Medicare Other

## 2019-12-15 ENCOUNTER — Other Ambulatory Visit: Payer: Medicare Other | Admitting: *Deleted

## 2019-12-15 ENCOUNTER — Ambulatory Visit (HOSPITAL_COMMUNITY): Payer: Medicare Other | Attending: Cardiovascular Disease

## 2019-12-15 ENCOUNTER — Other Ambulatory Visit: Payer: Self-pay

## 2019-12-15 DIAGNOSIS — K760 Fatty (change of) liver, not elsewhere classified: Secondary | ICD-10-CM | POA: Insufficient documentation

## 2019-12-15 DIAGNOSIS — R600 Localized edema: Secondary | ICD-10-CM

## 2019-12-15 DIAGNOSIS — I083 Combined rheumatic disorders of mitral, aortic and tricuspid valves: Secondary | ICD-10-CM | POA: Diagnosis not present

## 2019-12-15 DIAGNOSIS — E785 Hyperlipidemia, unspecified: Secondary | ICD-10-CM | POA: Diagnosis not present

## 2019-12-15 DIAGNOSIS — E119 Type 2 diabetes mellitus without complications: Secondary | ICD-10-CM | POA: Insufficient documentation

## 2019-12-15 DIAGNOSIS — M7989 Other specified soft tissue disorders: Secondary | ICD-10-CM | POA: Insufficient documentation

## 2019-12-15 LAB — BASIC METABOLIC PANEL
BUN/Creatinine Ratio: 29 — ABNORMAL HIGH (ref 9–23)
BUN: 17 mg/dL (ref 6–24)
CO2: 19 mmol/L — ABNORMAL LOW (ref 20–29)
Calcium: 9 mg/dL (ref 8.7–10.2)
Chloride: 102 mmol/L (ref 96–106)
Creatinine, Ser: 0.59 mg/dL (ref 0.57–1.00)
GFR calc Af Amer: 119 mL/min/{1.73_m2} (ref >59)
GFR calc non Af Amer: 104 mL/min/{1.73_m2} (ref >59)
Glucose: 142 mg/dL — ABNORMAL HIGH (ref 65–99)
Potassium: 4.4 mmol/L (ref 3.5–5.2)
Sodium: 139 mmol/L (ref 134–144)

## 2020-01-11 ENCOUNTER — Other Ambulatory Visit: Payer: Self-pay | Admitting: Physician Assistant

## 2020-01-11 DIAGNOSIS — K2100 Gastro-esophageal reflux disease with esophagitis, without bleeding: Secondary | ICD-10-CM

## 2020-01-14 ENCOUNTER — Other Ambulatory Visit: Payer: Self-pay | Admitting: Physician Assistant

## 2020-01-14 DIAGNOSIS — F419 Anxiety disorder, unspecified: Secondary | ICD-10-CM

## 2020-01-17 NOTE — Telephone Encounter (Signed)
Last appt 04/18/2020 Last filled 10/18/2019 #90 no refills.  Pended with note that she needs appt.

## 2020-02-10 ENCOUNTER — Telehealth: Payer: Self-pay | Admitting: Neurology

## 2020-02-10 NOTE — Telephone Encounter (Signed)
Received request from [facility] for recent sleep study. Patient has not had in over 15 years. Called and made her aware they will deny coverage until she completes. She is agreeable. Order entered for sleep study, waiting on Jacques Fife's signature then will send to [facility]. Note sent back to [facility] making them aware.

## 2020-02-17 ENCOUNTER — Telehealth: Payer: Self-pay | Admitting: Neurology

## 2020-02-17 NOTE — Telephone Encounter (Signed)
Spoke with patient. She states [employer] needs a $500 deposit for sleep study. She states once they get machine back they will give money back but she can not afford and wants to have done locally. She will discuss at appt with [facility] tomorrow.

## 2020-02-17 NOTE — Telephone Encounter (Signed)
Patient left vm stating she has some issues with her sleep study machine. Asking for call back at [phone].

## 2020-02-18 ENCOUNTER — Other Ambulatory Visit: Payer: Self-pay | Admitting: Physician Assistant

## 2020-02-18 ENCOUNTER — Encounter: Payer: Self-pay | Admitting: Physician Assistant

## 2020-02-18 ENCOUNTER — Ambulatory Visit (INDEPENDENT_AMBULATORY_CARE_PROVIDER_SITE_OTHER): Payer: Medicare Other | Admitting: Physician Assistant

## 2020-02-18 VITALS — BP 116/53 | HR 73 | Ht 60.0 in | Wt 301.0 lb

## 2020-02-18 DIAGNOSIS — E1165 Type 2 diabetes mellitus with hyperglycemia: Secondary | ICD-10-CM | POA: Diagnosis not present

## 2020-02-18 DIAGNOSIS — F419 Anxiety disorder, unspecified: Secondary | ICD-10-CM | POA: Diagnosis not present

## 2020-02-18 DIAGNOSIS — E1169 Type 2 diabetes mellitus with other specified complication: Secondary | ICD-10-CM

## 2020-02-18 DIAGNOSIS — E785 Hyperlipidemia, unspecified: Secondary | ICD-10-CM

## 2020-02-18 DIAGNOSIS — G4733 Obstructive sleep apnea (adult) (pediatric): Secondary | ICD-10-CM

## 2020-02-18 DIAGNOSIS — G4700 Insomnia, unspecified: Secondary | ICD-10-CM

## 2020-02-18 DIAGNOSIS — E782 Mixed hyperlipidemia: Secondary | ICD-10-CM

## 2020-02-18 DIAGNOSIS — I10 Essential (primary) hypertension: Secondary | ICD-10-CM | POA: Diagnosis not present

## 2020-02-18 DIAGNOSIS — K2100 Gastro-esophageal reflux disease with esophagitis, without bleeding: Secondary | ICD-10-CM

## 2020-02-18 DIAGNOSIS — Z9989 Dependence on other enabling machines and devices: Secondary | ICD-10-CM

## 2020-02-18 LAB — POCT GLYCOSYLATED HEMOGLOBIN (HGB A1C): Hemoglobin A1C: 6.5 % — AB (ref 4.0–5.6)

## 2020-02-18 MED ORDER — METOPROLOL SUCCINATE ER 25 MG PO TB24: 25.0000 mg | ORAL_TABLET | Freq: Every day | ORAL | 3 refills | Status: DC

## 2020-02-18 MED ORDER — OZEMPIC (0.25 OR 0.5 MG/DOSE) 2 MG/1.5ML ~~LOC~~ SOPN: 0.5000 mg | PEN_INJECTOR | SUBCUTANEOUS | 2 refills | Status: DC

## 2020-02-18 MED ORDER — TELMISARTAN-HCTZ 40-12.5 MG PO TABS: ORAL_TABLET | ORAL | 3 refills | Status: DC

## 2020-02-18 MED ORDER — ALPRAZOLAM 0.5 MG PO TABS: 0.5000 mg | ORAL_TABLET | Freq: Every day | ORAL | 5 refills | Status: DC

## 2020-02-18 MED ORDER — ATORVASTATIN CALCIUM 40 MG PO TABS: 40.0000 mg | ORAL_TABLET | Freq: Every day | ORAL | 3 refills | Status: DC

## 2020-02-18 NOTE — Patient Instructions (Signed)
Refer to sleep study 

## 2020-02-18 NOTE — Progress Notes (Addendum)
Subjective:    Patient ID: Tamara Greer, female    DOB: Aug 13, 1966, 54 y.o.   MRN: [id number]  HPI  Tamara Greer is a 54 year old female with T2DM, AAA, HTN, OSA, HLD presenting in the clinic for 3 month follow up and medication refills.  DM - Patient is checking sugars at home. She states they are running 110-150. She has been watching her diet. She admits to a few hypoglycemic episodes but has only experienced dizziness occasionally. She states the Ozempic has She denies chest pian, palpitations, headaches, open sores, wounds. She states her vision will get blurry intermittently but she read that was a side effect of Ozempic. She is sleeping well, she is not taking Trazodone any more because she is sleeping well when her anxiety is controlled. No acute concerns today.  Last AAA monitoring US done in 2017. CT done in 2018 did not show evidence of AAA.Patient does follow up with Cardiology regularly.   Patient has not had an Eye exam this year.  Patient denies Pneumococcal, COVID, flu vaccine.  .. Active Ambulatory Problems    Diagnosis Date Noted  . Endometrial cancer ([place]) 05/16/2014  . Essential hypertension 05/17/2014  . Insomnia 05/17/2014  . Anxiety 05/17/2014  . Right tibial fracture 05/17/2014  . Esophageal reflux 05/17/2014  . Vitamin D deficiency 05/19/2014  . Elevated liver enzymes 05/19/2014  . Primary osteoarthritis of both knees 05/31/2014  . BPV (benign positional vertigo) 09/30/2014  . Fatty liver disease, nonalcoholic 09/73/5329  . Abdominal aortic aneurysm ([place]) 10/21/2014  . LUQ pain 12/28/2014  . DDD (degenerative disc disease), cervical 08/08/2015  . Muscle spasms of head or neck 08/08/2015  . Notalgia 08/08/2015  . Morbidly obese ([place]) 10/13/2015  . Pre-diabetes 10/13/2015  . Hyperlipidemia 10/13/2015  . MRSA infection 01/28/2017  . Angina pectoris ([place])   . No energy 10/23/2017  . Uncontrolled type 2 diabetes mellitus with hyperglycemia ([place]) 11/21/2017   . Medication side effect 11/21/2017  . Low blood pressure reading 01/19/2018  . OSA (obstructive sleep apnea) 01/12/2019  . Vitamin D insufficiency 04/21/2019  . Thyroid disorder screening 04/21/2019   Resolved Ambulatory Problems    Diagnosis Date Noted  . Abdominal pain, left upper quadrant 10/28/2014  . Diabetes mellitus type II, controlled ([place]) 12/28/2014  . Abdominal pain, right upper quadrant 07/25/2015  . Diarrhea 07/25/2015  . Bloating 07/25/2015  . Heaviness of upper extremity 11/28/2016  . Abscess of breast, right 01/16/2017  . Cellulitis of right breast 01/28/2017  . Abscess of right breast 01/28/2017   Past Medical History:  Diagnosis Date  . Anxiety state 05/17/2014  . Cancer ([place])   . Essential hypertension, benign 05/17/2014  . Morbid obesity ([place]) 10/13/2015       Review of Systems  Constitutional: Negative.   HENT: Negative.   Eyes: Positive for visual disturbance (blurry vision intermittent ).  Respiratory: Negative.   Cardiovascular: Negative.   Gastrointestinal: Negative.   Endocrine: Negative.   Genitourinary: Negative.   Musculoskeletal: Negative.   Skin: Negative.   Neurological: Positive for dizziness (intermittent ). Negative for weakness, light-headedness and headaches.  Psychiatric/Behavioral: Negative.           Objective:   Physical Exam Constitutional:      Appearance: She is obese.  HENT:     Head: Normocephalic and atraumatic.  Eyes:     Extraocular Movements: Extraocular movements intact.     Pupils: Pupils are equal, round, and reactive to light.  Cardiovascular:  Rate and Rhythm: Normal rate and regular rhythm.     Pulses: Normal pulses.     Heart sounds: Normal heart sounds.  Pulmonary:     Effort: Pulmonary effort is normal.     Breath sounds: Normal breath sounds.  Skin:    General: Skin is warm and dry.  Neurological:     General: No focal deficit present.     Mental Status: She is alert and oriented to  person, place, and time.  Psychiatric:        Mood and Affect: Mood normal.        Behavior: Behavior normal.        Assessment & Plan:   DM Type 2, controlled - Doing well on Ozempic, no noticeable side effects.  - A1C 6.8 3 months ago, stable and controlled at 6.5 today.   HTN - Continue TOPROL-XL, MICARDIS HCT  HLD - Continue LIPITOR   Vit D insufficiency  - Continue supplementation   OSA - Continue using CPAP at night.  - Needs sleep study, but is having difficulty with insurance and finances to complete the study.    Anxiety - Continue xanax as needed.   AAA - pt followed by cardiology - Last [place] in 2017 showed aneurysm to be 2.5cm -CT 2018 no aneurysm found.   On statin.pt having some achiness. Ok to take every other day and see if helps with symptoms.  On ACE. BP to goal. Fasting labs ordered.  Pt declined pneumonia, COVID, and flu vaccine.  Patient needs eye exam, referral made.  Labs ordered: CBC, CMP, lipids, TSH  Follow up in 3 months or sooner if needed.   [name] KitchenVernetta Honey PA-C, have reviewed and agree with the above documentation in it's entirety.

## 2020-03-02 ENCOUNTER — Telehealth: Payer: Self-pay | Admitting: Neurology

## 2020-03-02 ENCOUNTER — Other Ambulatory Visit: Payer: Self-pay | Admitting: Neurology

## 2020-03-02 DIAGNOSIS — G4733 Obstructive sleep apnea (adult) (pediatric): Secondary | ICD-10-CM

## 2020-03-02 NOTE — Telephone Encounter (Signed)
Called [facility] and order did not go to their work queue. I re-entered order and confirmed they received it. Patient made aware. They will run insurance and contact her to schedule. She was driving and will send mychart message to patient with their contact information if she does not hear from them.

## 2020-03-02 NOTE — Telephone Encounter (Signed)
Patient left VM asking if we have heard an update about the sleep study. Looks like it was ordered on 02/18/2020. Note states: Faxed Order to [facility] at Fax [phone] and Ph is [phone]. Will check with them on status.

## 2020-04-03 ENCOUNTER — Encounter (HOSPITAL_BASED_OUTPATIENT_CLINIC_OR_DEPARTMENT_OTHER): Payer: Self-pay

## 2020-04-07 ENCOUNTER — Ambulatory Visit (HOSPITAL_BASED_OUTPATIENT_CLINIC_OR_DEPARTMENT_OTHER): Payer: Medicare Other | Attending: Physician Assistant | Admitting: Internal Medicine

## 2020-04-07 ENCOUNTER — Other Ambulatory Visit: Payer: Self-pay

## 2020-04-07 VITALS — Ht <= 58 in | Wt 300.0 lb

## 2020-04-07 DIAGNOSIS — G4733 Obstructive sleep apnea (adult) (pediatric): Secondary | ICD-10-CM | POA: Diagnosis not present

## 2020-04-07 DIAGNOSIS — Z9989 Dependence on other enabling machines and devices: Secondary | ICD-10-CM | POA: Diagnosis not present

## 2020-04-22 DIAGNOSIS — G4733 Obstructive sleep apnea (adult) (pediatric): Secondary | ICD-10-CM

## 2020-04-22 DIAGNOSIS — Z9989 Dependence on other enabling machines and devices: Secondary | ICD-10-CM | POA: Diagnosis not present

## 2020-04-22 NOTE — Procedures (Signed)
Patient Name: Tamara Greer, Tamara Greer Date: 04/07/2020 Gender: Female D.O.B: 03-04-1966 Age (years): 54 Referring Provider: Iran Planas Height (inches): 60 Interpreting Physician: Baird Lyons MD, ABSM Weight (lbs): 300 RPSGT: Baxter Flattery BMI: 24 MRN: [id number] Neck Size: 16.00  CLINICAL INFORMATION The patient is referred for a split night study with BPAP. MEDICATIONS Medications self-administered by patient taken the night of the study : XANAX XR, ASPIRIN  SLEEP STUDY TECHNIQUE As per the AASM Manual for the Scoring of Sleep and Associated Events v2.3 (April 2016) with a hypopnea requiring 4% desaturations.  The channels recorded and monitored were frontal, central and occipital EEG, electrooculogram (EOG), submentalis EMG (chin), nasal and oral airflow, thoracic and abdominal wall motion, anterior tibialis EMG, snore microphone, electrocardiogram, and pulse oximetry. Bi-level positive airway pressure (BiPAP) was initiated when the patient met split night criteria and was titrated according to treat sleep-disordered breathing.  RESPIRATORY PARAMETERS Diagnostic  Total AHI (/hr): 71.5 RDI (/hr): 71.5 OA Index (/hr): 49 CA Index (/hr): 0.0 REM AHI (/hr): N/A NREM AHI (/hr): 71.5 Supine AHI (/hr): N/A Non-supine AHI (/hr): 71.48 Min O2 Sat (%): 77.0 Mean O2 (%): 91.9 Time below 88% (min): 14.4   Titration  Optimal IPAP Pressure (cm): 19 Optimal EPAP Pressure (cm): 15 AHI at Optimal Pressure (/hr): 0.0 Min O2 at Optimal Pressure (%): 92.0 Sleep % at Optimal (%): 49 Supine % at Optimal (%): 100   SLEEP ARCHITECTURE The study was initiated at 11:15:48 PM and terminated at 5:18:49 AM. The total recorded time was 363 minutes. EEG confirmed total sleep time was 263.5 minutes yielding a sleep efficiency of 72.6%%. Sleep onset after lights out was 6.5 minutes with a REM latency of 257.5 minutes. The patient spent 13.7%% of the night in stage N1 sleep, 78.4%% in stage N2 sleep, 0.2%%  in stage N3 and 7.8% in REM. Wake after sleep onset (WASO) was 93.1 minutes. The Arousal Index was 7.1/hour.  LEG MOVEMENT DATA The total Periodic Limb Movements of Sleep (PLMS) were 0. The PLMS index was 0.0 .  CARDIAC DATA The 2 lead EKG demonstrated sinus rhythm. The mean heart rate was 100.0 beats per minute. Other EKG findings include: PVCs.  IMPRESSIONS - Severe obstructive sleep apnea occurred during the diagnostic portion of the study (AHI = 71.5 /hour). - Comfortable control was not achieved with CPAP at tolerated pressure and titration was changed to bilevel (BiPAP). - An optimal BiPAP pressure was selected for this patient ( 19 / 15 cm of water) - No significant central sleep apnea occurred during the diagnostic portion of the study (CAI = 0.0/hour). - Oxygen desaturation was noted during the diagnostic portion of the study (Min O2 = 77.0%). Minimum sat on BIPAP 19/15 was 92%. - The patient snored with soft snoring volume during the diagnostic portion of the study. - EKG findings include PVCs. - Frequent limb movements were noted (98.1/ hr), but no events were associated with arousal or awakening.  DIAGNOSIS - Obstructive Sleep Apnea (G47.33)  RECOMMENDATIONS - Trial of BiPAP therapy on 19/15 cm H2O. - Patient used a Small size Resmed Full Face Mask Mirage Quattro mask and heated humidification. - Watch for need to treat for limb movement sleep disorder, if persistent. - Be careful with alcohol, sedatives and other CNS depressants that may worsen sleep apnea and disrupt normal sleep architecture. - Sleep hygiene should be reviewed to assess factors that may improve sleep quality. - Weight management and regular exercise should be initiated or continued  [  Electronically signed] 04/22/2020 03:39 PM  Baird Lyons MD, [place], American Board of Sleep Medicine   NPI: [id number]                          [place], [place]  of Sleep Medicine  ELECTRONICALLY SIGNED ON:  04/22/2020, 3:31 PM [place] PH: [phone]   FX: [phone] [place]

## 2020-05-12 ENCOUNTER — Encounter: Payer: Self-pay | Admitting: Physician Assistant

## 2020-05-12 ENCOUNTER — Other Ambulatory Visit: Payer: Self-pay | Admitting: Neurology

## 2020-05-12 DIAGNOSIS — G4733 Obstructive sleep apnea (adult) (pediatric): Secondary | ICD-10-CM

## 2020-05-12 NOTE — Progress Notes (Signed)
Bipap ordered

## 2020-05-12 NOTE — Telephone Encounter (Signed)
Spoke with patient.

## 2020-05-12 NOTE — Progress Notes (Signed)
Tamara Greer,   Sleep study positive for sleep apnea. Suggest BiPAP of 19/15. We need to get this ordered for you.   [name] ok to send BIPAP order to [facility].

## 2020-06-15 ENCOUNTER — Telehealth: Payer: Self-pay | Admitting: Neurology

## 2020-06-15 NOTE — Telephone Encounter (Signed)
Patient left vm stating she is having issues with [employer] paying for CPAP and wants to discuss. [phone].

## 2020-06-15 NOTE — Telephone Encounter (Signed)
Spoke with patient. She states sleep study/medical proof she needs CPAP needs sent to [facility]. States they are sending her to collections. Made her aware last sleep study stated she needed a Bipap, she already had CPAP machine and can not afford the $500 up front cost of switching machine. She asked if Tamara Greer could maybe write something saying she has been doing well on CPAP? Let her know I would send this to [facility] to ask, but she is not back til Monday.   Printed last office note and sleep study results and faxed to [facility] at [phone] with confirmation received.

## 2020-07-10 ENCOUNTER — Ambulatory Visit (INDEPENDENT_AMBULATORY_CARE_PROVIDER_SITE_OTHER): Payer: Medicare Other

## 2020-07-10 ENCOUNTER — Ambulatory Visit (INDEPENDENT_AMBULATORY_CARE_PROVIDER_SITE_OTHER): Payer: Medicare Other | Admitting: Physician Assistant

## 2020-07-10 ENCOUNTER — Encounter: Payer: Self-pay | Admitting: Physician Assistant

## 2020-07-10 ENCOUNTER — Other Ambulatory Visit: Payer: Self-pay

## 2020-07-10 VITALS — BP 123/66 | HR 80 | Ht <= 58 in

## 2020-07-10 DIAGNOSIS — W19XXXA Unspecified fall, initial encounter: Secondary | ICD-10-CM

## 2020-07-10 DIAGNOSIS — M79661 Pain in right lower leg: Secondary | ICD-10-CM | POA: Diagnosis not present

## 2020-07-10 DIAGNOSIS — M25561 Pain in right knee: Secondary | ICD-10-CM

## 2020-07-10 DIAGNOSIS — W010XXA Fall on same level from slipping, tripping and stumbling without subsequent striking against object, initial encounter: Secondary | ICD-10-CM

## 2020-07-10 DIAGNOSIS — M79604 Pain in right leg: Secondary | ICD-10-CM

## 2020-07-10 DIAGNOSIS — M7989 Other specified soft tissue disorders: Secondary | ICD-10-CM

## 2020-07-10 MED ORDER — HYDROCODONE-ACETAMINOPHEN 5-325 MG PO TABS: 1.0000 | ORAL_TABLET | Freq: Four times a day (QID) | ORAL | 0 refills | Status: AC | PRN

## 2020-07-10 MED ORDER — HYDROCODONE-ACETAMINOPHEN 5-325 MG PO TABS: 1.0000 | ORAL_TABLET | Freq: Four times a day (QID) | ORAL | 0 refills | Status: DC | PRN

## 2020-07-10 NOTE — Progress Notes (Signed)
Subjective:    Patient ID: Tamara Greer, female    DOB: 03/18/1966, 54 y.o.   MRN: [id number]  HPI  Pt is a 54 yo obese female who presents to the clinic with her husband to follow up after fall 6 days ago. She tripped at home and her right ankle rolled and inverted causing her right leg to role outward and fell on right leg. Pt has a history of tibia fracture in same leg. Pain is 9/10 and located over anterior mid shin. She is taking tylenol and ibuprofen with no help. She thought it would get better but almost feels like getting worse. She is not able to bear weight and in wheelchair today. No fever, chills, SOB.  [name] Kitchen. Active Ambulatory Problems    Diagnosis Date Noted  . Endometrial cancer ([place]) 05/16/2014  . Essential hypertension 05/17/2014  . Insomnia 05/17/2014  . Anxiety 05/17/2014  . Right tibial fracture 05/17/2014  . Esophageal reflux 05/17/2014  . Vitamin D deficiency 05/19/2014  . Elevated liver enzymes 05/19/2014  . Primary osteoarthritis of both knees 05/31/2014  . BPV (benign positional vertigo) 09/30/2014  . Fatty liver disease, nonalcoholic 12/87/8676  . Abdominal aortic aneurysm ([place]) 10/21/2014  . LUQ pain 12/28/2014  . DDD (degenerative disc disease), cervical 08/08/2015  . Muscle spasms of head or neck 08/08/2015  . Notalgia 08/08/2015  . Morbidly obese ([place]) 10/13/2015  . Pre-diabetes 10/13/2015  . Hyperlipidemia 10/13/2015  . MRSA infection 01/28/2017  . Angina pectoris ([place])   . No energy 10/23/2017  . Uncontrolled type 2 diabetes mellitus with hyperglycemia ([place]) 11/21/2017  . Medication side effect 11/21/2017  . Low blood pressure reading 01/19/2018  . OSA (obstructive sleep apnea) 01/12/2019  . Vitamin D insufficiency 04/21/2019  . Thyroid disorder screening 04/21/2019   Resolved Ambulatory Problems    Diagnosis Date Noted  . Abdominal pain, left upper quadrant 10/28/2014  . Diabetes mellitus type II, controlled ([place]) 12/28/2014  . Abdominal  pain, right upper quadrant 07/25/2015  . Diarrhea 07/25/2015  . Bloating 07/25/2015  . Heaviness of upper extremity 11/28/2016  . Abscess of breast, right 01/16/2017  . Cellulitis of right breast 01/28/2017  . Abscess of right breast 01/28/2017   Past Medical History:  Diagnosis Date  . Anxiety state 05/17/2014  . Cancer ([place])   . Essential hypertension, benign 05/17/2014  . Morbid obesity ([place]) 10/13/2015        Review of Systems  All other systems reviewed and are negative.      Objective:   Physical Exam Vitals reviewed.  Constitutional:      Appearance: Normal appearance. She is obese.  Cardiovascular:     Rate and Rhythm: Normal rate and regular rhythm.     Pulses: Normal pulses.     Heart sounds: Normal heart sounds.  Pulmonary:     Effort: Pulmonary effort is normal.     Breath sounds: Normal breath sounds.  Musculoskeletal:     Comments: NROM of right ankle.  NROM at knee with extension.  Right calf 22inches. Left calf 21 inches.  Tenderness over right calf to palpation.  No warmth or redness over either leg.  Swelling and tenderness over right anterior and posterior leg. Pinpoint worse tenderness to direct palpation over anterior tibia about mid to upper shaft.  Negative homans sign.  Not able to bear weight due to pain.    Neurological:     General: No focal deficit present.     Mental Status: She is  alert and oriented to person, place, and time.           Assessment & Plan:  [name] KitchenMarland KitchenRavin was seen today for leg pain.  Diagnoses and all orders for this visit:  Fall, initial encounter -     DG Tibia/Fibula Right -     DG Knee 4 Views W/Patella Right -     US Venous Img Lower Unilateral Right -     CK -     COMPLETE METABOLIC PANEL WITH GFR  Acute pain of right knee -     DG Tibia/Fibula Right -     HYDROcodone-acetaminophen (NORCO/VICODIN) 5-325 MG tablet; Take 1 tablet by mouth every 6 (six) hours as needed for up to 5 days for moderate  pain. -     CK -     COMPLETE METABOLIC PANEL WITH GFR  Right leg pain -     DG Knee 4 Views W/Patella Right -     HYDROcodone-acetaminophen (NORCO/VICODIN) 5-325 MG tablet; Take 1 tablet by mouth every 6 (six) hours as needed for up to 5 days for moderate pain. -     US Venous Img Lower Unilateral Right -     CK -     COMPLETE METABOLIC PANEL WITH GFR  Right leg swelling -     US Venous Img Lower Unilateral Right -     CK -     COMPLETE METABOLIC PANEL WITH GFR  Right calf pain -     HYDROcodone-acetaminophen (NORCO/VICODIN) 5-325 MG tablet; Take 1 tablet by mouth every 6 (six) hours as needed for up to 5 days for moderate pain. -     US Venous Img Lower Unilateral Right -     CK -     COMPLETE METABOLIC PANEL WITH GFR  Other orders -     Discontinue: HYDROcodone-acetaminophen (NORCO/VICODIN) 5-325 MG tablet; Take 1 tablet by mouth every 6 (six) hours as needed for up to 5 days for moderate pain.   Stat xrays did not show any fracture.  Severe OA in right knee documented.  pts pain seems to be increasing. I am concerned about DVT especially with her being less mobile over past 6 days and her obesity. Will get DVT U/S. After 5 today and pt declines going to med center [facility]. Will order for first thing in AM. Discussed risk of waiting if it was blood clot. No SOB/Tachycardia for PE red flag. Pt is on ASA daily and she will remain on it.   I will also order CK/CMP to look for signs of compartment syndrome.   Treated 9/10 pain with norco in small quanity. Continue to keep leg elevated and iced.   Follow up as needed or with ortho/sports medicine in 1-2 weeks.

## 2020-07-10 NOTE — Patient Instructions (Signed)
Rest   Ice   Compress   Elevate

## 2020-07-11 ENCOUNTER — Ambulatory Visit (INDEPENDENT_AMBULATORY_CARE_PROVIDER_SITE_OTHER): Payer: Medicare Other

## 2020-07-11 DIAGNOSIS — M79661 Pain in right lower leg: Secondary | ICD-10-CM

## 2020-07-11 DIAGNOSIS — W19XXXA Unspecified fall, initial encounter: Secondary | ICD-10-CM

## 2020-07-11 NOTE — Progress Notes (Signed)
Great news. No evidence of DVT.

## 2020-07-12 LAB — COMPLETE METABOLIC PANEL WITH GFR
AG Ratio: 1.6 (calc) (ref 1.0–2.5)
ALT: 22 U/L (ref 6–29)
AST: 17 U/L (ref 10–35)
Albumin: 4 g/dL (ref 3.6–5.1)
Alkaline phosphatase (APISO): 90 U/L (ref 37–153)
BUN: 12 mg/dL (ref 7–25)
CO2: 26 mmol/L (ref 20–32)
Calcium: 9.1 mg/dL (ref 8.6–10.4)
Chloride: 104 mmol/L (ref 98–110)
Creat: 0.86 mg/dL (ref 0.50–1.05)
GFR, Est African American: 88 mL/min/{1.73_m2} (ref >=60)
GFR, Est Non African American: 76 mL/min/{1.73_m2} (ref >=60)
Globulin: 2.5 g/dL (calc) (ref 1.9–3.7)
Glucose, Bld: 113 mg/dL (ref 65–139)
Potassium: 4.4 mmol/L (ref 3.5–5.3)
Sodium: 138 mmol/L (ref 135–146)
Total Bilirubin: 0.4 mg/dL (ref 0.2–1.2)
Total Protein: 6.5 g/dL (ref 6.1–8.1)

## 2020-07-12 LAB — CK: Total CK: 59 U/L (ref 29–143)

## 2020-07-12 NOTE — Progress Notes (Signed)
No evidence of muscle breakdown in leg either. Bad contusion and twisting injury to the leg.

## 2020-07-17 ENCOUNTER — Other Ambulatory Visit: Payer: Self-pay | Admitting: Physician Assistant

## 2020-07-17 DIAGNOSIS — E1165 Type 2 diabetes mellitus with hyperglycemia: Secondary | ICD-10-CM

## 2020-07-24 ENCOUNTER — Ambulatory Visit: Payer: Medicare Other | Admitting: Physician Assistant

## 2020-07-25 ENCOUNTER — Ambulatory Visit (INDEPENDENT_AMBULATORY_CARE_PROVIDER_SITE_OTHER): Payer: Medicare Other | Admitting: Physician Assistant

## 2020-07-25 VITALS — BP 130/70 | HR 73 | Ht <= 58 in | Wt 315.0 lb

## 2020-07-25 DIAGNOSIS — E1169 Type 2 diabetes mellitus with other specified complication: Secondary | ICD-10-CM

## 2020-07-25 DIAGNOSIS — M79604 Pain in right leg: Secondary | ICD-10-CM | POA: Diagnosis not present

## 2020-07-25 DIAGNOSIS — E1165 Type 2 diabetes mellitus with hyperglycemia: Secondary | ICD-10-CM

## 2020-07-25 DIAGNOSIS — E538 Deficiency of other specified B group vitamins: Secondary | ICD-10-CM | POA: Diagnosis not present

## 2020-07-25 DIAGNOSIS — E785 Hyperlipidemia, unspecified: Secondary | ICD-10-CM

## 2020-07-25 DIAGNOSIS — Z8781 Personal history of (healed) traumatic fracture: Secondary | ICD-10-CM

## 2020-07-25 DIAGNOSIS — E559 Vitamin D deficiency, unspecified: Secondary | ICD-10-CM

## 2020-07-25 NOTE — Progress Notes (Signed)
Subjective:     Patient ID: Tamara Greer, female   DOB: Dec 10, 1965, 54 y.o.   MRN: [id number]  HPI  Pt is a 54 yo obese female with HTN, AAA, T2DM, HLD who presents to the clinic to follow up after fall 4 weeks ago.   She was seen in clinic on 07/10/2020 1 week after fall. No fracture seen. 9/10 pain then she is 5/10 pain today. No DVT or rhabdomyolysis. She can bear more weight today but still very painful and hard to get around. She is concerned due to her history of tibal fracture. She has seen Dr. Michaelle Birks in the past and would like to see him. Pt has taken some aleve but does not help that much. She wishes to have more norco for breakthrough pain.   Pt is on b12 supplementation and needs labs.   Pt is compliant with ozempic. No concerns or complaints. No hypoglycemic events. No open sores or wounds.   .. Active Ambulatory Problems    Diagnosis Date Noted   Endometrial cancer ([place]) 05/16/2014   Essential hypertension 05/17/2014   Insomnia 05/17/2014   Anxiety 05/17/2014   Right tibial fracture 05/17/2014   Esophageal reflux 05/17/2014   Vitamin D deficiency 05/19/2014   Elevated liver enzymes 05/19/2014   Primary osteoarthritis of both knees 05/31/2014   BPV (benign positional vertigo) 09/30/2014   Fatty liver disease, nonalcoholic 21/30/8657   Abdominal aortic aneurysm ([place]) 10/21/2014   LUQ pain 12/28/2014   DDD (degenerative disc disease), cervical 08/08/2015   Muscle spasms of head or neck 08/08/2015   Notalgia 08/08/2015   Morbidly obese ([place]) 10/13/2015   Pre-diabetes 10/13/2015   Hyperlipidemia 10/13/2015   MRSA infection 01/28/2017   Angina pectoris ([place])    No energy 10/23/2017   Uncontrolled type 2 diabetes mellitus with hyperglycemia ([place]) 11/21/2017   Medication side effect 11/21/2017   Low blood pressure reading 01/19/2018   OSA (obstructive sleep apnea) 01/12/2019   Vitamin D insufficiency 04/21/2019   Thyroid disorder screening  04/21/2019   Resolved Ambulatory Problems    Diagnosis Date Noted   Abdominal pain, left upper quadrant 10/28/2014   Diabetes mellitus type II, controlled ([place]) 12/28/2014   Abdominal pain, right upper quadrant 07/25/2015   Diarrhea 07/25/2015   Bloating 07/25/2015   Heaviness of upper extremity 11/28/2016   Abscess of breast, right 01/16/2017   Cellulitis of right breast 01/28/2017   Abscess of right breast 01/28/2017   Past Medical History:  Diagnosis Date   Anxiety state 05/17/2014   Cancer [facility])    Essential hypertension, benign 05/17/2014   Morbid obesity ([place]) 10/13/2015    Review of Systems  All other systems reviewed and are negative.      Objective:   Physical Exam Vitals reviewed.  Constitutional:      Appearance: Normal appearance. She is obese.  HENT:     Head: Normocephalic.  Cardiovascular:     Rate and Rhythm: Normal rate and regular rhythm.     Pulses: Normal pulses.  Pulmonary:     Effort: Pulmonary effort is normal.     Breath sounds: Normal breath sounds.  Musculoskeletal:     Comments: No significant edema in right vs left leg.  Very tender down tibia to palpation.  No redness or warmth.  ROM of right knee normal with pain during flexion more than extension.   Neurological:     General: No focal deficit present.     Mental Status: She is alert and oriented  to person, place, and time.  Psychiatric:        Mood and Affect: Mood normal.        Assessment:     [name] KitchenMarland KitchenPecolia was seen today for leg pain.  Diagnoses and all orders for this visit:  Acute leg pain, right -     naproxen (NAPROSYN) 500 MG tablet; Take 1 tablet (500 mg total) by mouth 2 (two) times daily with a meal. -     HYDROcodone-acetaminophen (NORCO/VICODIN) 5-325 MG tablet; Take 1 tablet by mouth every 8 (eight) hours as needed for up to 5 days for moderate pain. -     Ambulatory referral to Orthopedic Surgery  Controlled type 2 diabetes mellitus with  hyperglycemia, without long-term current use of insulin (HCC) -     Hemoglobin A1c  Hyperlipidemia associated with type 2 diabetes mellitus (HCC) -     Lipid Panel w/reflex Direct LDL  B12 deficiency -     Vitamin B12  Vitamin D insufficiency -     VITAMIN D 25 Hydroxy (Vit-D Deficiency, Fractures)  History of tibial fracture -     Ambulatory referral to Orthopedic Surgery       Plan:   Based on prior imaging no fracture.  Pt is getting better but slow process.  [name] KitchenMarland KitchenPDMP reviewed during this encounter. Small quanity of norco given to get to ortho appt.  Continue ice, added naprosyn rx.  Referral made to dr. Michaelle Birks.    Needs labs.  A1c ordered. Will adjust medications accordingly.  Continue ozempic. On statin.  BP to goal. On ARB.  Eye exam UTD.  Flu/pneumonia UTD.  Declines COVID vaccine.  Follow up in 3 months.  BP to goal.

## 2020-07-26 ENCOUNTER — Encounter: Payer: Self-pay | Admitting: Physician Assistant

## 2020-07-26 LAB — LIPID PANEL W/REFLEX DIRECT LDL
Cholesterol: 159 mg/dL (ref <200)
HDL: 54 mg/dL (ref >=50)
LDL Cholesterol (Calc): 84 mg/dL (calc)
Non-HDL Cholesterol (Calc): 105 mg/dL (calc) (ref <130)
Total CHOL/HDL Ratio: 2.9 (calc) (ref <5.0)
Triglycerides: 111 mg/dL (ref <150)

## 2020-07-26 LAB — HEMOGLOBIN A1C
Hgb A1c MFr Bld: 6.1 % of total Hgb — ABNORMAL HIGH (ref <5.7)
Mean Plasma Glucose: 128 (calc)
eAG (mmol/L): 7.1 (calc)

## 2020-07-26 LAB — VITAMIN D 25 HYDROXY (VIT D DEFICIENCY, FRACTURES): Vit D, 25-Hydroxy: 27 ng/mL — ABNORMAL LOW (ref 30–100)

## 2020-07-26 LAB — VITAMIN B12: Vitamin B-12: 344 pg/mL (ref 200–1100)

## 2020-07-26 MED ORDER — HYDROCODONE-ACETAMINOPHEN 5-325 MG PO TABS: 1.0000 | ORAL_TABLET | Freq: Three times a day (TID) | ORAL | 0 refills | Status: AC | PRN

## 2020-07-26 MED ORDER — NAPROXEN 500 MG PO TABS: 500.0000 mg | ORAL_TABLET | Freq: Two times a day (BID) | ORAL | 2 refills | Status: DC

## 2020-07-26 NOTE — Progress Notes (Signed)
Tamara Greer,   Your cholesterol is good. Not quite to goal of LDL under 70. Are you ok with increasing lipitor to 80mg ?  A1C is 6.1 which is improved.  Vitamin D low. Make sure taking at least 2000 units daily.  B12 normal but on the low side. Make sure taking at least 1089mcg daily.

## 2020-07-31 ENCOUNTER — Encounter: Payer: Self-pay | Admitting: Physician Assistant

## 2020-07-31 MED ORDER — OZEMPIC (0.25 OR 0.5 MG/DOSE) 2 MG/1.5ML ~~LOC~~ SOPN: PEN_INJECTOR | SUBCUTANEOUS | 2 refills | Status: DC

## 2020-08-20 ENCOUNTER — Other Ambulatory Visit: Payer: Self-pay | Admitting: Physician Assistant

## 2020-08-20 DIAGNOSIS — F419 Anxiety disorder, unspecified: Secondary | ICD-10-CM

## 2020-08-21 NOTE — Telephone Encounter (Signed)
Last written 02/18/2020 #30 with 5 refills Last appt 07/25/2020

## 2020-11-09 ENCOUNTER — Telehealth: Payer: Self-pay | Admitting: Neurology

## 2020-11-09 NOTE — Telephone Encounter (Signed)
Prior Authorization for Ozempic submitted via covermymeds.  "Drug is covered by current benefit plan. No further PA activity needed"

## 2020-12-14 ENCOUNTER — Other Ambulatory Visit: Payer: Self-pay | Admitting: Physician Assistant

## 2020-12-14 DIAGNOSIS — E1165 Type 2 diabetes mellitus with hyperglycemia: Secondary | ICD-10-CM

## 2020-12-29 ENCOUNTER — Other Ambulatory Visit: Payer: Self-pay

## 2020-12-29 DIAGNOSIS — M7989 Other specified soft tissue disorders: Secondary | ICD-10-CM

## 2020-12-29 MED ORDER — FUROSEMIDE 20 MG PO TABS: 20.0000 mg | ORAL_TABLET | Freq: Every day | ORAL | 0 refills | Status: DC

## 2021-01-15 ENCOUNTER — Encounter: Payer: Self-pay | Admitting: Physician Assistant

## 2021-01-15 ENCOUNTER — Other Ambulatory Visit: Payer: Self-pay

## 2021-01-15 ENCOUNTER — Ambulatory Visit: Payer: Medicare Other | Admitting: Physician Assistant

## 2021-01-15 VITALS — BP 94/61 | HR 72 | Ht <= 58 in | Wt 308.0 lb

## 2021-01-15 DIAGNOSIS — K5909 Other constipation: Secondary | ICD-10-CM | POA: Diagnosis not present

## 2021-01-15 DIAGNOSIS — E1165 Type 2 diabetes mellitus with hyperglycemia: Secondary | ICD-10-CM

## 2021-01-15 DIAGNOSIS — Z6841 Body Mass Index (BMI) 40.0 and over, adult: Secondary | ICD-10-CM

## 2021-01-15 DIAGNOSIS — I10 Essential (primary) hypertension: Secondary | ICD-10-CM

## 2021-01-15 LAB — POCT GLYCOSYLATED HEMOGLOBIN (HGB A1C): Hemoglobin A1C: 6 % — AB (ref 4.0–5.6)

## 2021-01-15 MED ORDER — TELMISARTAN-HCTZ 40-12.5 MG PO TABS: ORAL_TABLET | ORAL | 1 refills | Status: DC

## 2021-01-15 MED ORDER — LINACLOTIDE 145 MCG PO CAPS: 145.0000 ug | ORAL_CAPSULE | Freq: Every day | ORAL | 1 refills | Status: DC

## 2021-01-15 MED ORDER — OZEMPIC (0.25 OR 0.5 MG/DOSE) 2 MG/1.5ML ~~LOC~~ SOPN: 0.5000 mg | PEN_INJECTOR | SUBCUTANEOUS | 2 refills | Status: DC

## 2021-01-15 NOTE — Patient Instructions (Signed)
Take one-half tablet micardis daily for blood pressure.

## 2021-01-15 NOTE — Progress Notes (Signed)
Patient refused foot exam Discuss restarting Linzess - constipation from Ozempic Wants Telmisartan refilled in 30 day supplies - expensive A1C 6.0 - last Nov 6.1

## 2021-01-15 NOTE — Progress Notes (Signed)
Subjective:    Patient ID: Tamara Greer, female    DOB: [birth year], 55 y.o.   MRN: [id number]  HPI  Pt is a 55 yo female with T2DM, GERD, HTN, AAA, bilateral knee OA who presents to the clinic for follow up.   Patient is not checking her sugars.  She is only on Ozempic.  She cannot tolerate any higher dose.  She denies any significant GI symptoms on this dose.  She is having some constipation but she feels like this is mostly manageable.  She would like to have some Linzess to use.  She denies any open sores or wounds.  She denies any hypoglycemic events.  She is actively trying to lose weight so that she can have her knees replaced.  Her surgeon wants her to get under 300 pounds to do surgery.  .. Active Ambulatory Problems    Diagnosis Date Noted  . Endometrial cancer ([place]) 05/16/2014  . Essential hypertension 05/17/2014  . Insomnia 05/17/2014  . Anxiety 05/17/2014  . Right tibial fracture 05/17/2014  . Esophageal reflux 05/17/2014  . Vitamin D deficiency 05/19/2014  . Elevated liver enzymes 05/19/2014  . Primary osteoarthritis of both knees 05/31/2014  . BPV (benign positional vertigo) 09/30/2014  . Fatty liver disease, nonalcoholic 44/10/4740  . Abdominal aortic aneurysm ([place]) 10/21/2014  . LUQ pain 12/28/2014  . DDD (degenerative disc disease), cervical 08/08/2015  . Muscle spasms of head or neck 08/08/2015  . Notalgia 08/08/2015  . Class 3 severe obesity due to excess calories with serious comorbidity and body mass index (BMI) of 60.0 to 69.9 in adult ([place]) 10/13/2015  . Pre-diabetes 10/13/2015  . Hyperlipidemia 10/13/2015  . MRSA infection 01/28/2017  . Angina pectoris ([place])   . No energy 10/23/2017  . Controlled type 2 diabetes mellitus with hyperglycemia, without long-term current use of insulin ([place]) 11/21/2017  . Medication side effect 11/21/2017  . Low blood pressure reading 01/19/2018  . OSA (obstructive sleep apnea) 01/12/2019  . Vitamin D insufficiency  04/21/2019  . Thyroid disorder screening 04/21/2019  . Constipation, chronic 01/15/2021   Resolved Ambulatory Problems    Diagnosis Date Noted  . Abdominal pain, left upper quadrant 10/28/2014  . Diabetes mellitus type II, controlled ([place]) 12/28/2014  . Abdominal pain, right upper quadrant 07/25/2015  . Diarrhea 07/25/2015  . Bloating 07/25/2015  . Heaviness of upper extremity 11/28/2016  . Abscess of breast, right 01/16/2017  . Cellulitis of right breast 01/28/2017  . Abscess of right breast 01/28/2017   Past Medical History:  Diagnosis Date  . Anxiety state 05/17/2014  . Cancer ([place])   . Essential hypertension, benign 05/17/2014  . Morbid obesity ([place]) 10/13/2015     Review of Systems See HPI.     Objective:   Physical Exam Vitals reviewed.  Constitutional:      Appearance: Normal appearance. She is obese.  Cardiovascular:     Rate and Rhythm: Normal rate and regular rhythm.     Pulses: Normal pulses.  Pulmonary:     Effort: Pulmonary effort is normal.     Breath sounds: Normal breath sounds.  Musculoskeletal:     Right lower leg: No edema.     Left lower leg: No edema.  Neurological:     General: No focal deficit present.     Mental Status: She is alert and oriented to person, place, and time.  Psychiatric:        Mood and Affect: Mood normal.          .[name] Kitchen  Lab Results  Component Value Date   HGBA1C 6.0 (A) 01/15/2021    Assessment & Plan:  [name] KitchenMarland KitchenLorina was seen today for diabetes.  Diagnoses and all orders for this visit:  Controlled type 2 diabetes mellitus with hyperglycemia, without long-term current use of insulin ([facility]) -     POCT glycosylated hemoglobin (Hb A1C) -     OZEMPIC, 0.25 OR 0.5 MG/DOSE, 2 MG/1.5ML SOPN; Inject 0.5 mg into the skin once a week.  Essential hypertension -     telmisartan-hydrochlorothiazide (MICARDIS HCT) 40-12.5 MG tablet; TAKE 1 TABLET BY MOUTH ONCE DAILY.  Class 3 severe obesity due to excess calories with serious  comorbidity and body mass index (BMI) of 60.0 to 69.9 in adult [facility])  Constipation, chronic -     linaclotide (LINZESS) 145 MCG CAPS capsule; Take 1 capsule (145 mcg total) by mouth daily before breakfast.   A1C to goal.  Continue on ozempic.  BP low. Cut micardis HCT in half.  On STATIN.  UTD foot and eye exam.  Flu/covid/pneumonia vaccine UTD.   Added linzess for constipation.   [name] Kitchen.Discussed low carb diet with 1500 calories and 80g of protein.  Exercising at least 150 minutes a week.  [employer]itness Pal could be a Microbiologist.  Consider plenity for weight loss if needs to get to goal.

## 2021-02-12 ENCOUNTER — Other Ambulatory Visit: Payer: Self-pay

## 2021-02-12 DIAGNOSIS — M7989 Other specified soft tissue disorders: Secondary | ICD-10-CM

## 2021-02-12 MED ORDER — FUROSEMIDE 20 MG PO TABS: 20.0000 mg | ORAL_TABLET | Freq: Every day | ORAL | 0 refills | Status: DC

## 2021-02-12 NOTE — Telephone Encounter (Signed)
This is Dr. Cannon Ball's pt.  °

## 2021-02-14 ENCOUNTER — Other Ambulatory Visit: Payer: Self-pay | Admitting: Physician Assistant

## 2021-02-14 DIAGNOSIS — F419 Anxiety disorder, unspecified: Secondary | ICD-10-CM

## 2021-02-14 NOTE — Telephone Encounter (Signed)
Last appt 01/15/2021 Last written 08/21/2020 #30 with 5 refills

## 2021-03-06 ENCOUNTER — Telehealth: Payer: Self-pay | Admitting: Neurology

## 2021-03-06 NOTE — Telephone Encounter (Signed)
Patient has been scheduled for in the morning with Jade. Am

## 2021-03-06 NOTE — Telephone Encounter (Signed)
Patient called stating she has a UTI and needs an appt.  Can you call patient to schedule? [phone].

## 2021-03-07 ENCOUNTER — Ambulatory Visit: Payer: Medicare Other | Admitting: Physician Assistant

## 2021-03-20 ENCOUNTER — Ambulatory Visit: Payer: Medicare Other | Admitting: Physician Assistant

## 2021-03-20 ENCOUNTER — Other Ambulatory Visit: Payer: Self-pay

## 2021-03-20 VITALS — BP 130/59 | HR 64 | Temp 97.9°F | Ht <= 58 in | Wt 313.0 lb

## 2021-03-20 DIAGNOSIS — R300 Dysuria: Secondary | ICD-10-CM | POA: Diagnosis not present

## 2021-03-20 DIAGNOSIS — R8290 Unspecified abnormal findings in urine: Secondary | ICD-10-CM | POA: Diagnosis not present

## 2021-03-20 LAB — POCT URINALYSIS DIP (CLINITEK)
Bilirubin, UA: NEGATIVE
Glucose, UA: NEGATIVE mg/dL
Ketones, POC UA: NEGATIVE mg/dL
Nitrite, UA: POSITIVE — AB
Spec Grav, UA: 1.025 (ref 1.010–1.025)
Urobilinogen, UA: 0.2 E.U./dL
pH, UA: 6 (ref 5.0–8.0)

## 2021-03-20 MED ORDER — NITROFURANTOIN MONOHYD MACRO 100 MG PO CAPS: 100.0000 mg | ORAL_CAPSULE | Freq: Two times a day (BID) | ORAL | 0 refills | Status: AC

## 2021-03-20 NOTE — Patient Instructions (Signed)
Urinary Tract Infection, Adult A urinary tract infection (UTI) is an infection of any part of the urinary tract. The urinary tract includes the kidneys, ureters, bladder, and urethra. These organs make, store, and get rid of urine in the body. An upper UTI affects the ureters and kidneys. A lower UTI affects the bladder and urethra. What are the causes? Most urinary tract infections are caused by bacteria in your genital area around your urethra, where urine leaves your body. These bacteria grow and cause inflammation of your urinary tract. What increases the risk? You are more likely to develop this condition if: You have a urinary catheter that stays in place. You are not able to control when you urinate or have a bowel movement (incontinence). You are female and you: Use a spermicide or diaphragm for birth control. Have low estrogen levels. Are pregnant. You have certain genes that increase your risk. You are sexually active. You take antibiotic medicines. You have a condition that causes your flow of urine to slow down, such as: An enlarged prostate, if you are female. Blockage in your urethra. A kidney stone. A nerve condition that affects your bladder control (neurogenic bladder). Not getting enough to drink, or not urinating often. You have certain medical conditions, such as: Diabetes. A weak disease-fighting system (immunesystem). Sickle cell disease. Gout. Spinal cord injury. What are the signs or symptoms? Symptoms of this condition include: Needing to urinate right away (urgency). Frequent urination. This may include small amounts of urine each time you urinate. Pain or burning with urination. Blood in the urine. Urine that smells bad or unusual. Trouble urinating. Cloudy urine. Vaginal discharge, if you are female. Pain in the abdomen or the lower back. You may also have: Vomiting or a decreased appetite. Confusion. Irritability or tiredness. A fever or  chills. Diarrhea. The first symptom in older adults may be confusion. In some cases, they may not have any symptoms until the infection has worsened. How is this diagnosed? This condition is diagnosed based on your medical history and a physical exam. You may also have other tests, including: Urine tests. Blood tests. Tests for STIs (sexually transmitted infections). If you have had more than one UTI, a cystoscopy or imaging studies may be done to determine the cause of the infections. How is this treated? Treatment for this condition includes: Antibiotic medicine. Over-the-counter medicines to treat discomfort. Drinking enough water to stay hydrated. If you have frequent infections or have other conditions such as a kidney stone, you may need to see a health care provider who specializes in the urinary tract (urologist). In rare cases, urinary tract infections can cause sepsis. Sepsis is a life-threatening condition that occurs when the body responds to an infection. Sepsis is treated in the hospital with IV antibiotics, fluids, and other medicines. Follow these instructions at home: Medicines Take over-the-counter and prescription medicines only as told by your health care provider. If you were prescribed an antibiotic medicine, take it as told by your health care provider. Do not stop using the antibiotic even if you start to feel better. General instructions Make sure you: Empty your bladder often and completely. Do not hold urine for long periods of time. Empty your bladder after sex. Wipe from front to back after urinating or having a bowel movement if you are female. Use each tissue only one time when you wipe. Drink enough fluid to keep your urine pale yellow. Keep all follow-up visits. This is important. Contact a health care provider   if: Your symptoms do not get better after 1-2 days. Your symptoms go away and then return. Get help right away if: You have severe pain in your  back or your lower abdomen. You have a fever or chills. You have nausea or vomiting. Summary A urinary tract infection (UTI) is an infection of any part of the urinary tract, which includes the kidneys, ureters, bladder, and urethra. Most urinary tract infections are caused by bacteria in your genital area. Treatment for this condition often includes antibiotic medicines. If you were prescribed an antibiotic medicine, take it as told by your health care provider. Do not stop using the antibiotic even if you start to feel better. Keep all follow-up visits. This is important. This information is not intended to replace advice given to you by your health care provider. Make sure you discuss any questions you have with your health care provider. Document Revised: 03/31/2020 Document Reviewed: 03/31/2020 Elsevier Patient Education  2022 Elsevier Inc.  

## 2021-03-20 NOTE — Progress Notes (Signed)
Acute Office Visit  Subjective:    Patient ID: Tamara Greer, female    DOB: Oct 28, 1965, 55 y.o.   MRN: [id number]  Chief Complaint  Patient presents with  . Urinary Tract Infection   Patient with PMH of DM2 is in today for dysuria x 1 week Symptoms have been waxing and waning, improved with Azo last week but recurred Today c/o low back pain and urinary odor Denies fever, vomiting, flank pain, hematuria No hx of recurrent UTI or pyelonephritis Last documented urine culture 3 years ago - >100K Klebsiella pneumonia  Past Medical History:  Diagnosis Date  . Abdominal aortic aneurysm ([place]) 10/21/2014   (CT June 2016 via [facility] contests this Dx) 3.7cm. Asymptomatic. Follow up ultrasound in 2 years.  U/S 07/2015 stable. Managed by surgeon.    . Abdominal pain, left upper quadrant 10/28/2014  . Abdominal pain, right upper quadrant 07/25/2015  . Abscess of breast, right 01/16/2017  . Abscess of right breast 01/28/2017  . Anxiety state 05/17/2014  . Bloating 07/25/2015  . BPV (benign positional vertigo) 09/30/2014  . Cancer ([place])   . Cellulitis of right breast 01/28/2017  . DDD (degenerative disc disease), cervical 08/08/2015  . Diabetes mellitus type II, controlled ([place]) 12/28/2014  . Diarrhea 07/25/2015  . Elevated liver enzymes 05/19/2014  . Endometrial cancer ([place]) 05/16/2014   2010 surgery hysterectomy. Remission.    . Esophageal reflux 05/17/2014   7 years endoscopy normal.    . Essential hypertension, benign 05/17/2014  . Fatty liver disease, nonalcoholic 7/41/6384  . Heaviness of upper extremity 11/28/2016  . Hyperlipidemia 10/13/2015  . Insomnia 05/17/2014  . LUQ pain 12/28/2014  . Morbid obesity ([place]) 10/13/2015  . MRSA infection 01/28/2017  . Muscle spasms of head or neck 08/08/2015  . Notalgia 08/08/2015  . Pre-diabetes 10/13/2015  . Primary osteoarthritis of both knees 05/31/2014  . Right tibial fracture 05/17/2014   Repaired in 2014. Continues to walk with cane.    . Vitamin D  deficiency 05/19/2014    Past Surgical History:  Procedure Laterality Date  . ABDOMINAL HYSTERECTOMY    . LEFT HEART CATH AND CORONARY ANGIOGRAPHY N/A 05/19/2017   Procedure: LEFT HEART CATH AND CORONARY ANGIOGRAPHY;  Surgeon: Jettie Booze, MD;  Location: [facility];  Service: Cardiovascular;  Laterality: N/A;  . ORTHOPEDIC SURGERY     steel rod placed in tibia    Family History  Problem Relation Age of Onset  . Diabetes Mother   . Stroke Mother   . Heart failure Mother   . Heart failure Father   . COPD Father     Social History   Socioeconomic History  . Marital status: Married    Spouse name: Not on file  . Number of children: Not on file  . Years of education: Not on file  . Highest education level: Not on file  Occupational History  . Not on file  Tobacco Use  . Smoking status: Never  . Smokeless tobacco: Never  Substance and Sexual Activity  . Alcohol use: No  . Drug use: No  . Sexual activity: Yes  Other Topics Concern  . Not on file  Social History Narrative  . Not on file   Social Determinants of Health   Financial Resource Strain: Not on file  Food Insecurity: Not on file  Transportation Needs: Not on file  Physical Activity: Not on file  Stress: Not on file  Social Connections: Not on file  Intimate Partner Violence:  Not on file    Outpatient Medications Prior to Visit  Medication Sig Dispense Refill  . acetaminophen (TYLENOL) 500 MG tablet Take 500 mg by mouth daily as needed for mild pain or headache.    . ALPRAZolam (XANAX) 0.5 MG tablet TAKE 1 TABLET BY MOUTH AT BEDTIME 30 tablet 4  . AMBULATORY NON FORMULARY MEDICATION Glucometer, test strips, and lancets. Test blood glucose twice daily.  Please fill what insurance prefers. Dx; E11.9 100 each 2  . AMBULATORY NON FORMULARY MEDICATION Bilevel positive airway pressure (BiPAP) machine set at 15 cm/11cm of H2O pressure, with all supplemental supplies as needed. 1 each 0  . aspirin  EC 81 MG tablet Take 81 mg by mouth daily.    [name] Kitchen atorvastatin (LIPITOR) 40 MG tablet Take 1 tablet (40 mg total) by mouth daily. 90 tablet 3  . furosemide (LASIX) 20 MG tablet Take 1 tablet (20 mg total) by mouth daily. NEEDS APPOINTMENT FOR FUTURE REFILLS 30 tablet 0  . glucose blood test strip To check glucose up to three times a day for Diabetes management. Dx code E11.65 100 each 12  . linaclotide (LINZESS) 145 MCG CAPS capsule Take 1 capsule (145 mcg total) by mouth daily before breakfast. 90 capsule 1  . metoprolol succinate (TOPROL-XL) 25 MG 24 hr tablet Take 1 tablet (25 mg total) by mouth daily. 90 tablet 3  . naproxen (NAPROSYN) 500 MG tablet Take 1 tablet (500 mg total) by mouth 2 (two) times daily with a meal. 60 tablet 2  . ONE TOUCH ULTRA TEST test strip USE 1 STRIP TO CHECK GLUCOSE TWICE DAILY 100 each 0  . OZEMPIC, 0.25 OR 0.5 MG/DOSE, 2 MG/1.5ML SOPN Inject 0.5 mg into the skin once a week. 2 mL 2  . pantoprazole (PROTONIX) 40 MG tablet Take 1 tablet (40 mg total) by mouth daily. 90 tablet 3  . telmisartan-hydrochlorothiazide (MICARDIS HCT) 40-12.5 MG tablet TAKE 1 TABLET BY MOUTH ONCE DAILY. 90 tablet 1   No facility-administered medications prior to visit.    Allergies  Allergen Reactions  . Dexlansoprazole Other (See Comments)    Whole body aches/pain.  Whole body aches/pain.   . Lexapro [Escitalopram]     zombie  . Metformin     GI side effects  . Metformin And Related Nausea And Vomiting  . Xigduo Xr [Dapagliflozin-Metformin Hcl Er]     Nausea/vomiting.   . Diclofenac Rash    Topical cream gave RASH.     Review of Systems     Objective:    Physical Exam Vitals reviewed.  Constitutional:      Appearance: She is not ill-appearing or diaphoretic.  Pulmonary:     Effort: Pulmonary effort is normal.  Abdominal:     Tenderness: There is no right CVA tenderness or left CVA tenderness.  Neurological:     Mental Status: She is alert.     GCS: GCS eye subscore  is 4. GCS verbal subscore is 5. GCS motor subscore is 6.     Gait: Gait is intact.  Psychiatric:        Behavior: Behavior normal.        Thought Content: Thought content normal.    BP (!) 130/59   Pulse 64   Temp 97.9 F (36.6 C) (Oral)   Ht 4\' 10"  (1.473 m)   Wt (!) 313 lb (142 kg)   SpO2 97%   BMI 65.42 kg/m  Wt Readings from Last 3 Encounters:  03/20/21 [name] Kitchen)  313 lb (142 kg)  01/15/21 (!) 308 lb (139.7 kg)  07/25/20 (!) 315 lb (142.9 kg)    Health Maintenance Due  Topic Date Due  . COVID-19 Vaccine (1) Never done  . Pneumococcal Vaccine 45-48 Years old (1 - PCV) Never done  . OPHTHALMOLOGY EXAM  Never done  . Zoster Vaccines- Shingrix (1 of 2) Never done    There are no preventive care reminders to display for this patient.   Lab Results  Component Value Date   TSH 2.48 04/19/2019   Lab Results  Component Value Date   WBC 6.4 05/09/2017   HGB 12.8 05/09/2017   HCT 37.3 05/09/2017   MCV 91 05/09/2017   PLT 202 05/09/2017   Lab Results  Component Value Date   CREATININE 0.86 07/11/2020   BUN 12 07/11/2020   NA 138 07/11/2020   K 4.4 07/11/2020   CL 104 07/11/2020   CO2 26 07/11/2020     Recent Results (from the past 2160 hour(s))  POCT glycosylated hemoglobin (Hb A1C)     Status: Abnormal   Collection Time: 01/15/21 11:06 AM  Result Value Ref Range   Hemoglobin A1C 6.0 (A) 4.0 - 5.6 %   HbA1c POC (<> result, manual entry)     HbA1c, POC (prediabetic range)     HbA1c, POC (controlled diabetic range)    POCT URINALYSIS DIP (CLINITEK)     Status: Abnormal   Collection Time: 03/20/21  8:53 AM  Result Value Ref Range   Color, UA yellow yellow   Clarity, UA clear clear   Glucose, UA negative negative mg/dL   Bilirubin, UA negative negative   Ketones, POC UA negative negative mg/dL   Spec Grav, UA 1.025 1.010 - 1.025   Blood, UA small (A) negative   pH, UA 6.0 5.0 - 8.0   POC PROTEIN,UA trace negative, trace   Urobilinogen, UA 0.2 0.2 or 1.0 E.U./dL    Nitrite, UA Positive (A) Negative   Leukocytes, UA Moderate (2+) (A) Negative       Assessment & Plan:   Problem List Items Addressed This Visit   None Visit Diagnoses     Dysuria    -  Primary   Relevant Medications   nitrofurantoin, macrocrystal-monohydrate, (MACROBID) 100 MG capsule   Other Relevant Orders   POCT URINALYSIS DIP (CLINITEK) (Completed)   Urine Culture   Abnormal urine odor       Relevant Orders   POCT URINALYSIS DIP (CLINITEK) (Completed)   Urine Culture      Dysuria x 1 week UA personally reviewed - positive for mod leuks, nitrites and small blood Urine culture pending No signs/symptoms of suggestive of complicated UTI Treating empirically for uncomplicated UTI with Macrobid bid x 5 days while awaiting cx and sensitivities Can take OTC Azo today for discomfort, but d/c in order to monitor for worsening sx Counseled on return precautions Follow-up prn persistent or worsening symptoms   Meds ordered this encounter  Medications  . nitrofurantoin, macrocrystal-monohydrate, (MACROBID) 100 MG capsule    Sig: Take 1 capsule (100 mg total) by mouth 2 (two) times daily for 5 days.    Dispense:  10 capsule    Refill:  0    Order Specific Question:   Supervising Provider    Answer:   Emeterio Reeve [[id number]]      Trixie Dredge, PA-C

## 2021-03-22 LAB — URINE CULTURE
MICRO NUMBER:: 12138136
SPECIMEN QUALITY:: ADEQUATE

## 2021-03-25 ENCOUNTER — Other Ambulatory Visit: Payer: Self-pay | Admitting: Physician Assistant

## 2021-03-25 DIAGNOSIS — K2100 Gastro-esophageal reflux disease with esophagitis, without bleeding: Secondary | ICD-10-CM

## 2021-03-25 DIAGNOSIS — I10 Essential (primary) hypertension: Secondary | ICD-10-CM

## 2021-04-17 ENCOUNTER — Other Ambulatory Visit: Payer: Self-pay | Admitting: Physician Assistant

## 2021-04-17 DIAGNOSIS — I10 Essential (primary) hypertension: Secondary | ICD-10-CM

## 2021-04-17 DIAGNOSIS — E782 Mixed hyperlipidemia: Secondary | ICD-10-CM

## 2021-04-24 ENCOUNTER — Other Ambulatory Visit: Payer: Self-pay

## 2021-04-24 ENCOUNTER — Telehealth: Payer: Self-pay | Admitting: Cardiovascular Disease

## 2021-04-24 DIAGNOSIS — M7989 Other specified soft tissue disorders: Secondary | ICD-10-CM

## 2021-04-24 MED ORDER — FUROSEMIDE 20 MG PO TABS: 20.0000 mg | ORAL_TABLET | Freq: Every day | ORAL | 0 refills | Status: DC

## 2021-04-24 NOTE — Telephone Encounter (Signed)
*  STAT* If patient is at the pharmacy, call can be transferred to refill team.   1. Which medications need to be refilled? (please list name of each medication and dose if known) furosemide (LASIX) 20 MG tablet  2. Which pharmacy/location (including street and city if local pharmacy) is medication to be sent to? [place], [place] - [address]  3. Do they need a 30 day or 90 day supply? Needs enough medication to last her until her 06/29/21 appt.    Completely out of medication.

## 2021-05-17 ENCOUNTER — Other Ambulatory Visit: Payer: Self-pay | Admitting: Physician Assistant

## 2021-05-17 DIAGNOSIS — E1165 Type 2 diabetes mellitus with hyperglycemia: Secondary | ICD-10-CM

## 2021-05-25 ENCOUNTER — Ambulatory Visit: Payer: Medicare Other | Admitting: Physician Assistant

## 2021-05-25 ENCOUNTER — Encounter: Payer: Self-pay | Admitting: Physician Assistant

## 2021-05-25 ENCOUNTER — Other Ambulatory Visit: Payer: Self-pay

## 2021-05-25 VITALS — BP 117/61 | HR 73 | Ht <= 58 in | Wt 309.0 lb

## 2021-05-25 DIAGNOSIS — Z6841 Body Mass Index (BMI) 40.0 and over, adult: Secondary | ICD-10-CM

## 2021-05-25 DIAGNOSIS — R600 Localized edema: Secondary | ICD-10-CM | POA: Diagnosis not present

## 2021-05-25 DIAGNOSIS — E1165 Type 2 diabetes mellitus with hyperglycemia: Secondary | ICD-10-CM

## 2021-05-25 DIAGNOSIS — Z1231 Encounter for screening mammogram for malignant neoplasm of breast: Secondary | ICD-10-CM

## 2021-05-25 DIAGNOSIS — M7989 Other specified soft tissue disorders: Secondary | ICD-10-CM

## 2021-05-25 DIAGNOSIS — I714 Abdominal aortic aneurysm, without rupture, unspecified: Secondary | ICD-10-CM

## 2021-05-25 DIAGNOSIS — I10 Essential (primary) hypertension: Secondary | ICD-10-CM

## 2021-05-25 DIAGNOSIS — K5909 Other constipation: Secondary | ICD-10-CM

## 2021-05-25 LAB — POCT GLYCOSYLATED HEMOGLOBIN (HGB A1C): Hemoglobin A1C: 6.2 % — AB (ref 4.0–5.6)

## 2021-05-25 MED ORDER — LINACLOTIDE 145 MCG PO CAPS: 145.0000 ug | ORAL_CAPSULE | Freq: Every day | ORAL | 3 refills | Status: DC

## 2021-05-25 MED ORDER — FUROSEMIDE 20 MG PO TABS: 20.0000 mg | ORAL_TABLET | Freq: Every day | ORAL | 3 refills | Status: DC

## 2021-05-25 MED ORDER — LINACLOTIDE 145 MCG PO CAPS: 145.0000 ug | ORAL_CAPSULE | Freq: Every day | ORAL | 0 refills | Status: DC

## 2021-05-25 NOTE — Patient Instructions (Signed)
Chronic Venous Insufficiency Chronic venous insufficiency is a condition where the leg veins cannot effectively pump blood from the legs to the heart. This happens when the vein walls are either stretched, weakened, or damaged, or when the valves inside the vein are damaged. With the right treatment, you should be able to continue with an active life. This condition is also called venous stasis. What are the causes? Common causes of this condition include: High blood pressure inside the veins (venous hypertension). Sitting or standing too long, causing increased blood pressure in the leg veins. A blood clot that blocks blood flow in a vein (deep vein thrombosis, DVT). Inflammation of a vein (phlebitis) that causes a blood clot to form. Tumors in the pelvis that cause blood to back up. What increases the risk? The following factors may make you more likely to develop this condition: Having a family history of this condition. Obesity. Pregnancy. Living without enough regular physical activity or exercise (sedentary lifestyle). Smoking. Having a job that requires long periods of standing or sitting in one place. Being a certain age. Women in their 40s and 62s and men in their 72s are more likely to develop this condition. What are the signs or symptoms? Symptoms of this condition include: Veins that are enlarged, bulging, or twisted (varicose veins). Skin breakdown or ulcers. Reddened skin or dark discoloration of skin on the leg between the knee and ankle. Brown, smooth, tight, and painful skin just above the ankle, usually on the inside of the leg (lipodermatosclerosis). Swelling of the legs. How is this diagnosed? This condition may be diagnosed based on: Your medical history. A physical exam. Tests, such as: A procedure that creates an image of a blood vessel and nearby organs and provides information about blood flow through the blood vessel (duplex ultrasound). A procedure that  tests blood flow (plethysmography). A procedure that looks at the veins using X-ray and dye (venogram). How is this treated? The goals of treatment are to help you return to an active life and to minimize pain or disability. Treatment depends on the severity of your condition, and it may include: Wearing compression stockings. These can help relieve symptoms and help prevent your condition from getting worse. However, they do not cure the condition. Sclerotherapy. This procedure involves an injection of a solution that shrinks damaged veins. Surgery. This may involve: Removing a diseased vein (vein stripping). Cutting off blood flow through the vein (laser ablation surgery). Repairing or reconstructing a valve within the affected vein. Follow these instructions at home:   Wear compression stockings as told by your health care provider. These stockings help to prevent blood clots and reduce swelling in your legs. Take over-the-counter and prescription medicines only as told by your health care provider. Stay active by exercising, walking, or doing different activities. Ask your health care provider what activities are safe for you and how much exercise you need. Drink enough fluid to keep your urine pale yellow. Do not use any products that contain nicotine or tobacco, such as cigarettes, e-cigarettes, and chewing tobacco. If you need help quitting, ask your health care provider. Keep all follow-up visits as told by your health care provider. This is important. Contact a health care provider if you: Have redness, swelling, or more pain in the affected area. See a red streak or line that goes up or down from the affected area. Have skin breakdown or skin loss in the affected area, even if the breakdown is small. Get an injury  in the affected area. Get help right away if: You get an injury and an open wound in the affected area. You have: Severe pain that does not get better with  medicine. Sudden numbness or weakness in the foot or ankle below the affected area. Trouble moving your foot or ankle. A fever. Worse or persistent symptoms. Chest pain. Shortness of breath. Summary Chronic venous insufficiency is a condition where the leg veins cannot effectively pump blood from the legs to the heart. Chronic venous insufficiency occurs when the vein walls become stretched, weakened, or damaged, or when valves within the vein are damaged. Treatment depends on how severe your condition is. It often involves wearing compression stockings and may involve having a procedure. Make sure you stay active by exercising, walking, or doing different activities. Ask your health care provider what activities are safe for you and how much exercise you need. This information is not intended to replace advice given to you by your health care provider. Make sure you discuss any questions you have with your health care provider. Document Revised: 10/31/2020 Document Reviewed: 10/31/2020 Elsevier Patient Education  [place].

## 2021-05-25 NOTE — Progress Notes (Signed)
Subjective:    Patient ID: Tamara Greer, female    DOB: 1965-10-16, 55 y.o.   MRN: [id number]  HPI Patient is a 55 year old obese female with AAA, hypertension, OSA, type 2 diabetes, chronic constipation who presents to the clinic for follow-up and medication refills.  Patient has been using Linzess for quite some time but it is becoming too expensive.  Her insurance would not cover this.  She would like to find a way because it worked so well keeping her going.  Since starting Ozempic her constipation has gotten worse.  Patient is not regularly checking her sugars.  She is taking Ozempic weekly.  She is tolerating it well.  She has noticed a decrease in appetite.  She is losing weight slowly.  She denies any open sores or wounds.  She denies any hypoglycemic events.  Bilateral legs swell and hurt at times. No cramping with walking. No ulcer. Both legs hurt. She does take lasix daily.    .. Active Ambulatory Problems    Diagnosis Date Noted   Endometrial cancer ([place]) 05/16/2014   Essential hypertension 05/17/2014   Insomnia 05/17/2014   Anxiety 05/17/2014   Right tibial fracture 05/17/2014   Esophageal reflux 05/17/2014   Vitamin D deficiency 05/19/2014   Elevated liver enzymes 05/19/2014   Primary osteoarthritis of both knees 05/31/2014   BPV (benign positional vertigo) 09/30/2014   Fatty liver disease, nonalcoholic 09/98/3382   Abdominal aortic aneurysm ([place]) 10/21/2014   LUQ pain 12/28/2014   DDD (degenerative disc disease), cervical 08/08/2015   Muscle spasms of head or neck 08/08/2015   Notalgia 08/08/2015   Class 3 severe obesity due to excess calories with serious comorbidity and body mass index (BMI) of 60.0 to 69.9 in adult ([place]) 10/13/2015   Pre-diabetes 10/13/2015   Hyperlipidemia 10/13/2015   MRSA infection 01/28/2017   Angina pectoris ([place])    No energy 10/23/2017   Controlled type 2 diabetes mellitus with hyperglycemia, without long-term current use of insulin  ([place]) 11/21/2017   Medication side effect 11/21/2017   Low blood pressure reading 01/19/2018   OSA (obstructive sleep apnea) 01/12/2019   Vitamin D insufficiency 04/21/2019   Thyroid disorder screening 04/21/2019   Constipation, chronic 01/15/2021   Bilateral edema of lower extremity 05/28/2021   Resolved Ambulatory Problems    Diagnosis Date Noted   Abdominal pain, left upper quadrant 10/28/2014   Diabetes mellitus type II, controlled ([place]) 12/28/2014   Abdominal pain, right upper quadrant 07/25/2015   Diarrhea 07/25/2015   Bloating 07/25/2015   Heaviness of upper extremity 11/28/2016   Abscess of breast, right 01/16/2017   Cellulitis of right breast 01/28/2017   Abscess of right breast 01/28/2017   Past Medical History:  Diagnosis Date   Anxiety state 05/17/2014   Cancer ([place])    Essential hypertension, benign 05/17/2014   Morbid obesity ([place]) 10/13/2015    Review of Systems See HPI>     Objective:   Physical Exam Vitals reviewed.  Constitutional:      Appearance: Normal appearance. She is obese.  HENT:     Head: Normocephalic.  Cardiovascular:     Rate and Rhythm: Normal rate and regular rhythm.     Pulses: Normal pulses.     Heart sounds: Normal heart sounds. No murmur heard. Pulmonary:     Effort: Pulmonary effort is normal.     Breath sounds: Normal breath sounds.  Abdominal:     General: Bowel sounds are normal.     Palpations: Abdomen  is soft.  Musculoskeletal:     Comments: Scant bilateral leg edema. Bilateral good pedal pulses. No pain to palpation over calf, bruising, redness, or warmth. No ulcers. Some varicostiles noted on bilateral legs.   Neurological:     General: No focal deficit present.     Mental Status: She is alert and oriented to person, place, and time.  Psychiatric:        Mood and Affect: Mood normal.      .. Lab Results  Component Value Date   HGBA1C 6.2 (A) 05/25/2021       Assessment & Plan:  [name] KitchenMarland KitchenTiya was seen today for  diabetes.  Diagnoses and all orders for this visit:  Controlled type 2 diabetes mellitus with hyperglycemia, without long-term current use of insulin (HCC) -     POCT glycosylated hemoglobin (Hb A1C)  Visit for screening mammogram -     MM 3D SCREEN BREAST BILATERAL  Constipation, chronic -     Discontinue: linaclotide (LINZESS) 145 MCG CAPS capsule; Take 1 capsule (145 mcg total) by mouth daily before breakfast. -     linaclotide (LINZESS) 145 MCG CAPS capsule; Take 1 capsule (145 mcg total) by mouth daily before breakfast.  Bilateral edema of lower extremity -     furosemide (LASIX) 20 MG tablet; Take 1 tablet (20 mg total) by mouth daily.  Class 3 severe obesity due to excess calories with serious comorbidity and body mass index (BMI) of 60.0 to 69.9 in adult [facility])  Essential hypertension  Abdominal aortic aneurysm (AAA) without rupture (HCC)  A1C to goal.  Continue ozempic.  Continue to work on weight loss.  AAA- continue to [facility] with cardiology.  BP to goal. Continue micardis.  On statin.  Foot exam UTD.  Needs eye exam.  Declines covid/flu/pneumonia vaccines.   Refilled lasix for edema. Good pedal pulses and no signs and/or symptoms of claudication. She is at high risk for PVD.   Constipation- sample of linzess given. Re-wrote prescription and gave coupon card to go with it. Discussed diet to help with constipation.   Follow up in 3 months.

## 2021-05-28 ENCOUNTER — Encounter: Payer: Self-pay | Admitting: Physician Assistant

## 2021-05-28 DIAGNOSIS — R600 Localized edema: Secondary | ICD-10-CM | POA: Insufficient documentation

## 2021-06-20 ENCOUNTER — Other Ambulatory Visit: Payer: Self-pay | Admitting: Physician Assistant

## 2021-06-20 DIAGNOSIS — E1165 Type 2 diabetes mellitus with hyperglycemia: Secondary | ICD-10-CM

## 2021-06-29 ENCOUNTER — Ambulatory Visit (HOSPITAL_BASED_OUTPATIENT_CLINIC_OR_DEPARTMENT_OTHER): Payer: Medicare Other | Admitting: Cardiovascular Disease

## 2021-07-16 ENCOUNTER — Other Ambulatory Visit: Payer: Self-pay | Admitting: Physician Assistant

## 2021-07-16 DIAGNOSIS — F419 Anxiety disorder, unspecified: Secondary | ICD-10-CM

## 2021-07-16 NOTE — Telephone Encounter (Signed)
Last appt 05/25/2021 Last written 02/16/2021 #30 with 4 refills

## 2021-07-24 ENCOUNTER — Ambulatory Visit (HOSPITAL_BASED_OUTPATIENT_CLINIC_OR_DEPARTMENT_OTHER): Payer: Medicare Other | Admitting: Nurse Practitioner

## 2021-07-24 NOTE — Progress Notes (Deleted)
Cardiology Office Note:    Date:  07/24/2021   ID:  Tamara Greer, DOB Mar 17, 1966, MRN [id number]  PCP:  Donella Stade, PA-C   [facility] Providers Cardiologist:  Skeet Latch, MD { Click to update primary MD,subspecialty MD or APP then REFRESH:1}    Referring MD: Donella Stade, PA-C   No chief complaint on file. ***  History of Present Illness:    Tamara Greer is a 55 y.o. 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 She was started on low dose furosemide due to history of hypotension and 2 week f/u was recommended.   Today, she presents for   Past Medical History:  Diagnosis Date   Abdominal aortic aneurysm ([place]) 10/21/2014   (CT June 2016 via [facility] contests this Dx) 3.7cm. Asymptomatic. Follow up ultrasound in 2 years.  U/S 07/2015 stable. Managed by surgeon.     Abdominal pain, left upper quadrant 10/28/2014   Abdominal pain, right upper quadrant 07/25/2015   Abscess of breast, right 01/16/2017   Abscess of right breast 01/28/2017   Anxiety state 05/17/2014   Bloating 07/25/2015   BPV (benign positional vertigo) 09/30/2014   Cancer ([place])    Cellulitis of right breast 01/28/2017   DDD (degenerative disc disease), cervical 08/08/2015   Diabetes mellitus type II, controlled ([place]) 12/28/2014   Diarrhea 07/25/2015   Elevated liver enzymes 05/19/2014   Endometrial cancer ([place]) 05/16/2014   2010 surgery hysterectomy. Remission.     Esophageal reflux 05/17/2014   7 years endoscopy normal.     Essential hypertension, benign 05/17/2014   Fatty liver disease, nonalcoholic 2/69/4854   Heaviness of upper extremity 11/28/2016   Hyperlipidemia 10/13/2015   Insomnia 05/17/2014   LUQ pain 12/28/2014   Morbid obesity ([place]) 10/13/2015   MRSA infection 01/28/2017   Muscle spasms of  head or neck 08/08/2015   Notalgia 08/08/2015   Pre-diabetes 10/13/2015   Primary osteoarthritis of both knees 05/31/2014   Right tibial fracture 05/17/2014   Repaired in 2014. Continues to walk with cane.     Vitamin D deficiency 05/19/2014    Past Surgical History:  Procedure Laterality Date   ABDOMINAL HYSTERECTOMY     LEFT HEART CATH AND CORONARY ANGIOGRAPHY N/A 05/19/2017   Procedure: LEFT HEART CATH AND CORONARY ANGIOGRAPHY;  Surgeon: Jettie Booze, MD;  Location: [facility];  Service: Cardiovascular;  Laterality: N/A;   ORTHOPEDIC SURGERY     steel rod placed in tibia    Current Medications: No outpatient medications have been marked as taking for the 07/24/21 encounter (Appointment) with Ann Maki, Lanice Schwab, NP.     Allergies:   Dexlansoprazole, Lexapro [escitalopram], Metformin, Metformin and related, Xigduo xr [dapagliflozin-metformin hcl er], and Diclofenac   Social History   Socioeconomic History   Marital status: Married    Spouse name: Not on file   Number of children: Not on file   Years of education: Not on file   Highest education level: Not on file  Occupational History   Not on file  Tobacco Use   Smoking status: Never   Smokeless tobacco: Never  Substance and Sexual Activity   Alcohol use: No   Drug use: No   Sexual activity: Yes  Other Topics Concern   Not on file  Social History Narrative   Not on file   Social Determinants of Health   Financial Resource Strain: Not on file  Food Insecurity: Not on file  Transportation Needs: Not on file  Physical Activity: Not on file  Stress: Not on file  Social Connections: Not on file     Family History: The patient's ***family history includes COPD in her father; Diabetes in her mother; Heart failure in her father and mother; Stroke in her mother.  ROS:   Please see the history of present illness.    *** All other systems reviewed and are negative.  Labs/Other Studies Reviewed:    The  following studies were reviewed today: ***  Recent Labs: No results found for requested labs within last 8760 hours.  Recent Lipid Panel    Component Value Date/Time   CHOL 159 07/25/2020 0926   TRIG 111 07/25/2020 0926   HDL 54 07/25/2020 0926   CHOLHDL 2.9 07/25/2020 0926   VLDL 25 11/26/2016 1459   LDLCALC 84 07/25/2020 0926     Risk Assessment/Calculations:   {Does this patient have ATRIAL FIBRILLATION?[phone]}       Physical Exam:    VS:  There were no vitals taken for this visit.    Wt Readings from Last 3 Encounters:  05/25/21 (!) 309 lb (140.2 kg)  03/20/21 (!) 313 lb (142 kg)  01/15/21 (!) 308 lb (139.7 kg)     GEN: *** Well nourished, well developed in no acute distress HEENT: Normal NECK: No JVD; No carotid bruits LYMPHATICS: No lymphadenopathy CARDIAC: ***RRR, no murmurs, rubs, gallops RESPIRATORY:  Clear to auscultation without rales, wheezing or rhonchi  ABDOMEN: Soft, non-tender, non-distended MUSCULOSKELETAL:  No edema; No deformity  SKIN: Warm and dry NEUROLOGIC:  Alert and oriented x 3 PSYCHIATRIC:  Normal affect   EKG:  EKG is *** ordered today.  The ekg ordered today demonstrates ***  Diagnoses:    No diagnosis found. Assessment and Plan:     ***      {Are you ordering a CV Procedure (e.g. stress test, cath, DCCV, TEE, etc)?   Press F2        :[id number]}    Medication Adjustments/Labs and Tests Ordered: Current medicines are reviewed at length with the patient today.  Concerns regarding medicines are outlined above.  No orders of the defined types were placed in this encounter.  No orders of the defined types were placed in this encounter.   There are no Patient Instructions on file for this visit.   Signed, Emmaline Life, NP  07/24/2021 1:03 PM    [facility]

## 2021-08-01 ENCOUNTER — Other Ambulatory Visit: Payer: Self-pay | Admitting: Physician Assistant

## 2021-08-01 DIAGNOSIS — E1165 Type 2 diabetes mellitus with hyperglycemia: Secondary | ICD-10-CM

## 2021-09-12 ENCOUNTER — Other Ambulatory Visit: Payer: Self-pay | Admitting: Physician Assistant

## 2021-09-12 DIAGNOSIS — E782 Mixed hyperlipidemia: Secondary | ICD-10-CM

## 2021-09-18 ENCOUNTER — Other Ambulatory Visit: Payer: Self-pay | Admitting: Physician Assistant

## 2021-09-18 DIAGNOSIS — E1165 Type 2 diabetes mellitus with hyperglycemia: Secondary | ICD-10-CM

## 2021-09-27 ENCOUNTER — Telehealth: Payer: Self-pay | Admitting: Neurology

## 2021-09-27 ENCOUNTER — Other Ambulatory Visit: Payer: Self-pay | Admitting: Physician Assistant

## 2021-09-27 DIAGNOSIS — E1165 Type 2 diabetes mellitus with hyperglycemia: Secondary | ICD-10-CM

## 2021-09-27 NOTE — Telephone Encounter (Signed)
Patient called and stated that Ozempic is on back order at her [facility] in [place]. She has been off medication for two weeks. She is overdue for an appt. Appt made for Monday. She will call around to see if another pharmacy has this in stock and we can send to them.

## 2021-09-28 NOTE — Telephone Encounter (Signed)
[place] pt

## 2021-09-28 NOTE — Telephone Encounter (Signed)
Called and left vm letting patient know higher dosage won't work since she is on 0.25 mg injection.

## 2021-10-01 ENCOUNTER — Other Ambulatory Visit: Payer: Self-pay

## 2021-10-01 ENCOUNTER — Ambulatory Visit: Payer: Medicare Other | Admitting: Physician Assistant

## 2021-10-01 VITALS — BP 123/62 | HR 66 | Ht 60.0 in | Wt 305.0 lb

## 2021-10-01 DIAGNOSIS — E1165 Type 2 diabetes mellitus with hyperglycemia: Secondary | ICD-10-CM | POA: Diagnosis not present

## 2021-10-01 DIAGNOSIS — E785 Hyperlipidemia, unspecified: Secondary | ICD-10-CM

## 2021-10-01 DIAGNOSIS — I10 Essential (primary) hypertension: Secondary | ICD-10-CM | POA: Diagnosis not present

## 2021-10-01 DIAGNOSIS — Z1231 Encounter for screening mammogram for malignant neoplasm of breast: Secondary | ICD-10-CM

## 2021-10-01 DIAGNOSIS — Z79899 Other long term (current) drug therapy: Secondary | ICD-10-CM

## 2021-10-01 DIAGNOSIS — E1169 Type 2 diabetes mellitus with other specified complication: Secondary | ICD-10-CM | POA: Diagnosis not present

## 2021-10-01 DIAGNOSIS — I714 Abdominal aortic aneurysm, without rupture, unspecified: Secondary | ICD-10-CM

## 2021-10-01 DIAGNOSIS — E559 Vitamin D deficiency, unspecified: Secondary | ICD-10-CM

## 2021-10-01 DIAGNOSIS — J029 Acute pharyngitis, unspecified: Secondary | ICD-10-CM | POA: Diagnosis not present

## 2021-10-01 DIAGNOSIS — E782 Mixed hyperlipidemia: Secondary | ICD-10-CM

## 2021-10-01 DIAGNOSIS — Z6841 Body Mass Index (BMI) 40.0 and over, adult: Secondary | ICD-10-CM

## 2021-10-01 DIAGNOSIS — G4733 Obstructive sleep apnea (adult) (pediatric): Secondary | ICD-10-CM

## 2021-10-01 DIAGNOSIS — E538 Deficiency of other specified B group vitamins: Secondary | ICD-10-CM

## 2021-10-01 LAB — POCT GLYCOSYLATED HEMOGLOBIN (HGB A1C): Hemoglobin A1C: 4 % (ref 4.0–5.6)

## 2021-10-01 LAB — POCT RAPID STREP A (OFFICE): Rapid Strep A Screen: NEGATIVE

## 2021-10-01 MED ORDER — TELMISARTAN-HCTZ 40-12.5 MG PO TABS: ORAL_TABLET | ORAL | 1 refills | Status: DC

## 2021-10-01 NOTE — Progress Notes (Signed)
dicatio

## 2021-10-01 NOTE — Progress Notes (Signed)
Subjective:    Patient ID: Tamara Greer, female    DOB: [birth year], 56 y.o.   MRN: [id number]  HPI Pt is a 56 yo obese female with T2DM, AAA, HTN, HLD, OSA who presents to the clinic for 3 month follow up.   She is using her CPaP nightly and loves the benefits. Sleeping well and has pretty good energy.   No CP, palpitations, headaches or vision changes. Pt has not had AAA followed up on in a while.   Pt is not checking sugars. No open sores or wounds. She continues to work on diet and weight loss.she lost 8lbs over past 3 months. She is feeling good. Taking ozempic .25mg  weekly. She is tolerating well.   Pt is having a ST for the last day or so. No fever, chills, body aches. No SOB or cough. Not tried anything to make better.   .. Active Ambulatory Problems    Diagnosis Date Noted   Endometrial cancer ([place]) 05/16/2014   Essential hypertension 05/17/2014   Insomnia 05/17/2014   Anxiety 05/17/2014   Right tibial fracture 05/17/2014   Esophageal reflux 05/17/2014   Vitamin D deficiency 05/19/2014   Elevated liver enzymes 05/19/2014   Primary osteoarthritis of both knees 05/31/2014   BPV (benign positional vertigo) 09/30/2014   Fatty liver disease, nonalcoholic 09/10/3233   Abdominal aortic aneurysm ([place]) 10/21/2014   LUQ pain 12/28/2014   DDD (degenerative disc disease), cervical 08/08/2015   Muscle spasms of head or neck 08/08/2015   Notalgia 08/08/2015   Class 3 severe obesity due to excess calories with serious comorbidity and body mass index (BMI) of 60.0 to 69.9 in adult ([place]) 10/13/2015   Pre-diabetes 10/13/2015   Hyperlipidemia 10/13/2015   MRSA infection 01/28/2017   Angina pectoris ([place])    No energy 10/23/2017   Controlled type 2 diabetes mellitus with hyperglycemia, without long-term current use of insulin ([place]) 11/21/2017   Medication side effect 11/21/2017   Low blood pressure reading 01/19/2018   OSA (obstructive sleep apnea) 01/12/2019   Vitamin D  insufficiency 04/21/2019   Thyroid disorder screening 04/21/2019   Constipation, chronic 01/15/2021   Bilateral edema of lower extremity 05/28/2021   Sore throat 10/01/2021   Resolved Ambulatory Problems    Diagnosis Date Noted   Abdominal pain, left upper quadrant 10/28/2014   Diabetes mellitus type II, controlled ([place]) 12/28/2014   Abdominal pain, right upper quadrant 07/25/2015   Diarrhea 07/25/2015   Bloating 07/25/2015   Heaviness of upper extremity 11/28/2016   Abscess of breast, right 01/16/2017   Cellulitis of right breast 01/28/2017   Abscess of right breast 01/28/2017   Past Medical History:  Diagnosis Date   Anxiety state 05/17/2014   Cancer ([place])    Essential hypertension, benign 05/17/2014   Morbid obesity ([place]) 10/13/2015     Review of Systems See HPI.     Objective:   Physical Exam Vitals reviewed.  Constitutional:      Appearance: Normal appearance. She is obese.  HENT:     Head: Normocephalic.     Right Ear: Tympanic membrane, ear canal and external ear normal. There is no impacted cerumen.     Left Ear: Tympanic membrane, ear canal and external ear normal. There is no impacted cerumen.     Nose: Nose normal.     Mouth/Throat:     Mouth: Mucous membranes are moist.     Pharynx: Posterior oropharyngeal erythema present. No oropharyngeal exudate.  Eyes:     Conjunctiva/sclera:  Conjunctivae normal.  Neck:     Vascular: No carotid bruit.  Cardiovascular:     Rate and Rhythm: Normal rate and regular rhythm.     Pulses: Normal pulses.     Heart sounds: Normal heart sounds.  Pulmonary:     Effort: Pulmonary effort is normal.     Breath sounds: Normal breath sounds.  Musculoskeletal:     Right lower leg: No edema.     Left lower leg: No edema.  Neurological:     Mental Status: She is alert and oriented to person, place, and time.  Psychiatric:        Mood and Affect: Mood normal.   .. Depression screen [facility] 2/9 01/15/2021 02/18/2020 04/19/2019  01/12/2019 11/30/2018  Decreased Interest 0 2 1 2 2   Down, Depressed, Hopeless 0 1 1 2 2   PHQ - 2 Score 0 3 2 4 4   Altered sleeping - 1 2 3 2   Tired, decreased energy - 1 1 0 2  Change in appetite - 1 0 2 2  Feeling bad or failure about yourself  - 1 0 0 2  Trouble concentrating - 0 0 0 1  Moving slowly or fidgety/restless - 0 0 0 0  Suicidal thoughts - 0 0 0 0  PHQ-9 Score - 7 5 9 13   Difficult doing work/chores - Not difficult at all Not difficult at all Somewhat difficult Very difficult   .[name] Kitchen GAD 7 : Generalized Anxiety Score 02/18/2020 04/19/2019 01/12/2019 10/21/2017  Nervous, Anxious, on Edge 0 1 1 1   Control/stop worrying 0 1 2 0  Worry too much - different things 0 1 0 0  Trouble relaxing 0 1 0 0  Restless 2 1 0 0  Easily annoyed or irritable 0 1 1 1   Afraid - awful might happen 0 1 1 1   Total GAD 7 Score 2 7 5 3   Anxiety Difficulty Not difficult at all Not difficult at all Not difficult at all Not difficult at all          Assessment & Plan:  [name] KitchenMarland KitchenMyrian was seen today for medication management.  Diagnoses and all orders for this visit:  Controlled type 2 diabetes mellitus with hyperglycemia, without long-term current use of insulin (HCC) -     COMPLETE METABOLIC PANEL WITH GFR -     POCT glycosylated hemoglobin (Hb A1C) -     Ambulatory referral to Ophthalmology  Essential hypertension -     COMPLETE METABOLIC PANEL WITH GFR -     telmisartan-hydrochlorothiazide (MICARDIS HCT) 40-12.5 MG tablet; TAKE 1 TABLET BY MOUTH ONCE DAILY.  Hyperlipidemia associated with type 2 diabetes mellitus ([place]) -     Lipid Panel w/reflex Direct LDL  Class 3 severe obesity due to excess calories with serious comorbidity and body mass index (BMI) of 60.0 to 69.9 in adult (HCC) -     TSH  Mixed hyperlipidemia -     Lipid Panel w/reflex Direct LDL  Vitamin D insufficiency -     Vitamin D (25 hydroxy)  B12 deficiency -     Vitamin B12  Medication management -     TSH -      Lipid Panel w/reflex Direct LDL -     COMPLETE METABOLIC PANEL WITH GFR -     CBC with Differential/Platelet -     POCT glycosylated hemoglobin (Hb A1C) -     Vitamin D (25 hydroxy) -     Vitamin B12  Abdominal aortic aneurysm (  AAA) without rupture, unspecified part -     US AORTA DUPLEX COMPLETE; Future  Encounter for screening mammogram for malignant neoplasm of breast -     MM 3D SCREEN BREAST BILATERAL  Sore throat -     POCT rapid strep A   A1C looks GREAT.  Continue on ozempic. Continue to work on weight loss. BP to goal.  Continue on lipitor. Needs eye exam.  Declines flu/covid/shingrix/pneumonia vaccines. Pt aware of risk.   Ordered screening labs.   Rapid strep negative.  Discussed viral nature of ST.  Encouraged symptomatic care and to follow up if symptoms not improving or worsening.   Mammogram ordered.  Abdominal U/S follow up on AAA ordered.

## 2021-10-02 ENCOUNTER — Encounter: Payer: Self-pay | Admitting: Physician Assistant

## 2021-10-02 LAB — COMPLETE METABOLIC PANEL WITH GFR
AG Ratio: 1.6 (calc) (ref 1.0–2.5)
ALT: 20 U/L (ref 6–29)
AST: 17 U/L (ref 10–35)
Albumin: 4.3 g/dL (ref 3.6–5.1)
Alkaline phosphatase (APISO): 100 U/L (ref 37–153)
BUN: 11 mg/dL (ref 7–25)
CO2: 29 mmol/L (ref 20–32)
Calcium: 9.6 mg/dL (ref 8.6–10.4)
Chloride: 102 mmol/L (ref 98–110)
Creat: 0.75 mg/dL (ref 0.50–1.03)
Globulin: 2.7 g/dL (calc) (ref 1.9–3.7)
Glucose, Bld: 118 mg/dL — ABNORMAL HIGH (ref 65–99)
Potassium: 4.7 mmol/L (ref 3.5–5.3)
Sodium: 141 mmol/L (ref 135–146)
Total Bilirubin: 0.8 mg/dL (ref 0.2–1.2)
Total Protein: 7 g/dL (ref 6.1–8.1)
eGFR: 93 mL/min/{1.73_m2} (ref >=60)

## 2021-10-02 LAB — VITAMIN B12: Vitamin B-12: 434 pg/mL (ref 200–1100)

## 2021-10-02 LAB — CBC WITH DIFFERENTIAL/PLATELET
Absolute Monocytes: 534 cells/uL (ref 200–950)
Basophils Absolute: 18 cells/uL (ref 0–200)
Basophils Relative: 0.2 %
Eosinophils Absolute: 125 cells/uL (ref 15–500)
Eosinophils Relative: 1.4 %
HCT: 39.5 % (ref 35.0–45.0)
Hemoglobin: 13.6 g/dL (ref 11.7–15.5)
Lymphs Abs: 2065 cells/uL (ref 850–3900)
MCH: 31.8 pg (ref 27.0–33.0)
MCHC: 34.4 g/dL (ref 32.0–36.0)
MCV: 92.3 fL (ref 80.0–100.0)
MPV: 12.3 fL (ref 7.5–12.5)
Monocytes Relative: 6 %
Neutro Abs: 6159 cells/uL (ref 1500–7800)
Neutrophils Relative %: 69.2 %
Platelets: 231 10*3/uL (ref 140–400)
RBC: 4.28 10*6/uL (ref 3.80–5.10)
RDW: 12.8 % (ref 11.0–15.0)
Total Lymphocyte: 23.2 %
WBC: 8.9 10*3/uL (ref 3.8–10.8)

## 2021-10-02 LAB — LIPID PANEL W/REFLEX DIRECT LDL
Cholesterol: 144 mg/dL (ref <200)
HDL: 68 mg/dL (ref >=50)
LDL Cholesterol (Calc): 58 mg/dL (calc)
Non-HDL Cholesterol (Calc): 76 mg/dL (calc) (ref <130)
Total CHOL/HDL Ratio: 2.1 (calc) (ref <5.0)
Triglycerides: 95 mg/dL (ref <150)

## 2021-10-02 LAB — VITAMIN D 25 HYDROXY (VIT D DEFICIENCY, FRACTURES): Vit D, 25-Hydroxy: 29 ng/mL — ABNORMAL LOW (ref 30–100)

## 2021-10-02 LAB — TSH: TSH: 2.18 mIU/L (ref 0.40–4.50)

## 2021-10-02 MED ORDER — VITAMIN D (ERGOCALCIFEROL) 1.25 MG (50000 UNIT) PO CAPS: 50000.0000 [IU] | ORAL_CAPSULE | ORAL | 12 refills | Status: DC

## 2021-10-02 NOTE — Progress Notes (Signed)
Kidney and liver look great.  Thyroid looks great.  B12 looks good.  LDL to goal.  HDL looks amazing.  Vitamin D still low. How much do you take?

## 2021-10-03 ENCOUNTER — Encounter: Payer: Self-pay | Admitting: Physician Assistant

## 2021-10-03 ENCOUNTER — Telehealth: Payer: Self-pay

## 2021-10-03 MED ORDER — OZEMPIC (0.25 OR 0.5 MG/DOSE) 2 MG/1.5ML ~~LOC~~ SOPN: 0.2500 mg | PEN_INJECTOR | SUBCUTANEOUS | 0 refills | Status: DC

## 2021-10-03 NOTE — Telephone Encounter (Signed)
I contacted the patient to let her know which pharmacy that her medication was sent to and she wanted me to let you know that she still has a sore throat, small cough and no fever. She stated that you wanted her to update you if she wasn't feeling any better since she seen you on Monday.

## 2021-10-04 ENCOUNTER — Encounter: Payer: Self-pay | Admitting: Physician Assistant

## 2021-10-04 DIAGNOSIS — E1165 Type 2 diabetes mellitus with hyperglycemia: Secondary | ICD-10-CM

## 2021-10-05 MED ORDER — OZEMPIC (0.25 OR 0.5 MG/DOSE) 2 MG/1.5ML ~~LOC~~ SOPN: 0.2500 mg | PEN_INJECTOR | SUBCUTANEOUS | 0 refills | Status: DC

## 2021-10-18 ENCOUNTER — Ambulatory Visit (INDEPENDENT_AMBULATORY_CARE_PROVIDER_SITE_OTHER): Payer: Medicare Other

## 2021-10-18 ENCOUNTER — Other Ambulatory Visit: Payer: Self-pay

## 2021-10-18 DIAGNOSIS — I714 Abdominal aortic aneurysm, without rupture, unspecified: Secondary | ICD-10-CM

## 2021-10-19 NOTE — Progress Notes (Signed)
No changes. Stable compared to 2018. Great news.

## 2021-10-22 ENCOUNTER — Other Ambulatory Visit: Payer: Self-pay | Admitting: Physician Assistant

## 2021-10-22 DIAGNOSIS — E782 Mixed hyperlipidemia: Secondary | ICD-10-CM

## 2021-11-02 ENCOUNTER — Encounter: Payer: Self-pay | Admitting: Physician Assistant

## 2021-11-02 DIAGNOSIS — K5909 Other constipation: Secondary | ICD-10-CM

## 2021-11-02 MED ORDER — LINACLOTIDE 145 MCG PO CAPS: 145.0000 ug | ORAL_CAPSULE | Freq: Every day | ORAL | 3 refills | Status: DC

## 2021-11-09 ENCOUNTER — Other Ambulatory Visit: Payer: Self-pay | Admitting: Physician Assistant

## 2021-11-09 DIAGNOSIS — F419 Anxiety disorder, unspecified: Secondary | ICD-10-CM

## 2021-11-09 NOTE — Telephone Encounter (Signed)
Last written 07/16/2021 #30 with 3 refills ?Last appt 10/01/2021 ?

## 2021-11-27 ENCOUNTER — Other Ambulatory Visit: Payer: Self-pay | Admitting: Physician Assistant

## 2021-11-27 DIAGNOSIS — I10 Essential (primary) hypertension: Secondary | ICD-10-CM

## 2022-01-11 ENCOUNTER — Other Ambulatory Visit: Payer: Self-pay | Admitting: Family Medicine

## 2022-01-11 DIAGNOSIS — F419 Anxiety disorder, unspecified: Secondary | ICD-10-CM

## 2022-01-11 MED ORDER — SEMAGLUTIDE (2 MG/DOSE) 8 MG/3ML ~~LOC~~ SOPN
0.2500 mg | PEN_INJECTOR | SUBCUTANEOUS | 0 refills | Status: DC
Start: 2022-01-11 — End: 2022-01-17

## 2022-01-11 NOTE — Telephone Encounter (Signed)
Xanax last written 11/09/2021 #30 with 1 refill ? ?Last appt 10/01/2021 ? ?Please advise on refill.  ? ?Patient also called stating Ozempic issue with [facility], but she needs refill. Last sent as 1.5 mg pen which they no longer make. RX sent to [facility] for Ozempic.  ? ? ? ? ?

## 2022-01-14 NOTE — Telephone Encounter (Signed)
Patient made aware RX sent to pharmacies. She expressed understanding.  ?

## 2022-01-17 MED ORDER — OZEMPIC (0.25 OR 0.5 MG/DOSE) 2 MG/3ML ~~LOC~~ SOPN: 0.2500 mg | PEN_INJECTOR | SUBCUTANEOUS | 1 refills | Status: DC

## 2022-01-17 NOTE — Addendum Note (Signed)
Addended byAnnamaria Helling on: 01/17/2022 09:55 AM   Modules accepted: Orders

## 2022-02-12 ENCOUNTER — Other Ambulatory Visit: Payer: Self-pay | Admitting: Family Medicine

## 2022-02-12 DIAGNOSIS — F419 Anxiety disorder, unspecified: Secondary | ICD-10-CM

## 2022-03-15 ENCOUNTER — Other Ambulatory Visit: Payer: Self-pay | Admitting: Physician Assistant

## 2022-03-15 DIAGNOSIS — F419 Anxiety disorder, unspecified: Secondary | ICD-10-CM

## 2022-03-15 NOTE — Telephone Encounter (Signed)
Needs a follow up soon. 15 sent.

## 2022-03-15 NOTE — Telephone Encounter (Signed)
Last written 02/13/2022 #30 no refills Last appt 10/01/2021

## 2022-03-15 NOTE — Telephone Encounter (Signed)
Schedule for follow up appt please

## 2022-03-15 NOTE — Telephone Encounter (Signed)
LVM for patient to call back to get appt scheduled with PCP. amuck

## 2022-03-18 NOTE — Telephone Encounter (Signed)
Patient called back and left vm on my line. Can we reach back out about an appt?

## 2022-03-18 NOTE — Telephone Encounter (Signed)
Patient is scheduled for 04/15/22 and requested another refill of Xanax to last through appt in August.

## 2022-03-18 NOTE — Telephone Encounter (Signed)
Only sent #15, scheduled appt for one month out, did you want to send anymore?

## 2022-03-21 ENCOUNTER — Other Ambulatory Visit: Payer: Self-pay | Admitting: Physician Assistant

## 2022-03-21 DIAGNOSIS — F419 Anxiety disorder, unspecified: Secondary | ICD-10-CM

## 2022-03-21 NOTE — Telephone Encounter (Signed)
Per [place] - "My appointment isn't until August 14 I will be out before that appointment. Could I possibly get 15 more I promise I will keep my appointment. Thank you Tamara Greer."

## 2022-03-22 MED ORDER — ALPRAZOLAM 0.5 MG PO TABS: 0.5000 mg | ORAL_TABLET | Freq: Every day | ORAL | 0 refills | Status: DC

## 2022-03-29 NOTE — Telephone Encounter (Signed)
Patient left vm asking for Xanax RF. Has appt on 04/15/2022.  Last written 03/22/2022 #15 with no refills for her to take once daily.

## 2022-04-09 ENCOUNTER — Ambulatory Visit: Payer: Medicare Other | Admitting: Physician Assistant

## 2022-04-09 ENCOUNTER — Encounter: Payer: Self-pay | Admitting: Physician Assistant

## 2022-04-09 VITALS — BP 87/74 | HR 70 | Ht 60.0 in | Wt 311.0 lb

## 2022-04-09 DIAGNOSIS — F419 Anxiety disorder, unspecified: Secondary | ICD-10-CM | POA: Diagnosis not present

## 2022-04-09 DIAGNOSIS — E1169 Type 2 diabetes mellitus with other specified complication: Secondary | ICD-10-CM | POA: Diagnosis not present

## 2022-04-09 DIAGNOSIS — I10 Essential (primary) hypertension: Secondary | ICD-10-CM

## 2022-04-09 DIAGNOSIS — R600 Localized edema: Secondary | ICD-10-CM

## 2022-04-09 DIAGNOSIS — F339 Major depressive disorder, recurrent, unspecified: Secondary | ICD-10-CM

## 2022-04-09 DIAGNOSIS — I959 Hypotension, unspecified: Secondary | ICD-10-CM

## 2022-04-09 DIAGNOSIS — E1165 Type 2 diabetes mellitus with hyperglycemia: Secondary | ICD-10-CM

## 2022-04-09 DIAGNOSIS — E785 Hyperlipidemia, unspecified: Secondary | ICD-10-CM

## 2022-04-09 DIAGNOSIS — Z6841 Body Mass Index (BMI) 40.0 and over, adult: Secondary | ICD-10-CM

## 2022-04-09 LAB — POCT UA - MICROALBUMIN
Albumin/Creatinine Ratio, Urine, POC: <30
Creatinine, POC: 50 mg/dL
Microalbumin Ur, POC: 10 mg/L

## 2022-04-09 LAB — POCT GLYCOSYLATED HEMOGLOBIN (HGB A1C): HbA1c, POC (controlled diabetic range): 6.1 % (ref 0.0–7.0)

## 2022-04-09 MED ORDER — FUROSEMIDE 20 MG PO TABS: 20.0000 mg | ORAL_TABLET | Freq: Two times a day (BID) | ORAL | 1 refills | Status: DC

## 2022-04-09 MED ORDER — BUPROPION HCL ER (XL) 150 MG PO TB24: 150.0000 mg | ORAL_TABLET | ORAL | 1 refills | Status: DC

## 2022-04-09 MED ORDER — OZEMPIC (0.25 OR 0.5 MG/DOSE) 2 MG/3ML ~~LOC~~ SOPN: 0.2500 mg | PEN_INJECTOR | SUBCUTANEOUS | 1 refills | Status: DC

## 2022-04-09 MED ORDER — ALPRAZOLAM 0.5 MG PO TABS
0.5000 mg | ORAL_TABLET | Freq: Every day | ORAL | 5 refills | Status: DC
Start: 2022-04-09 — End: 2022-10-04

## 2022-04-09 MED ORDER — TELMISARTAN 40 MG PO TABS: 40.0000 mg | ORAL_TABLET | Freq: Every day | ORAL | 1 refills | Status: DC

## 2022-04-09 NOTE — Progress Notes (Signed)
Established Patient Office Visit  Subjective   Patient ID: Tamara Greer, female    DOB: 11/06/1965  Age: 56 y.o. MRN: [id number]  Chief Complaint  Patient presents with   Follow-up   Diabetes    HPI Pt is a 56 yo obese female with HTN, T2DM, HLD, anxiety, depression who presents to the clinic for follow up.   She is not checking her sugars but taking ozempic. She has not had for the last month. She is trying to watch diet. She is trying to stay active but no exercise. She is having some redness and more swelling of both legs over last week. She took one lasix and not really helping. No injury.   She needs her xanax for once a day. Her husband is alcoholic and emotionally this is hard on her. She finds herself down a lot more and tearful. She wonders about a daily medication. No SI/HC.   BP low today. She does report some dizziness when changing positions from time to time. No syncope. Not checking BP at home. No CP or palpitations.  Took lasix today.   .. Active Ambulatory Problems    Diagnosis Date Noted   Endometrial cancer ([place]) 05/16/2014   Essential hypertension 05/17/2014   Insomnia 05/17/2014   Anxiety 05/17/2014   Right tibial fracture 05/17/2014   Esophageal reflux 05/17/2014   Vitamin D deficiency 05/19/2014   Elevated liver enzymes 05/19/2014   Primary osteoarthritis of both knees 05/31/2014   BPV (benign positional vertigo) 09/30/2014   Fatty liver disease, nonalcoholic 40/98/1191   Abdominal aortic aneurysm ([place]) 10/21/2014   LUQ pain 12/28/2014   DDD (degenerative disc disease), cervical 08/08/2015   Muscle spasms of head or neck 08/08/2015   Notalgia 08/08/2015   Class 3 severe obesity due to excess calories with serious comorbidity and body mass index (BMI) of 60.0 to 69.9 in adult ([place]) 10/13/2015   Pre-diabetes 10/13/2015   Hyperlipidemia 10/13/2015   MRSA infection 01/28/2017   Angina pectoris ([place])    No energy 10/23/2017   Controlled type 2  diabetes mellitus with hyperglycemia, without long-term current use of insulin ([place]) 11/21/2017   Medication side effect 11/21/2017   Hypotension 01/19/2018   OSA (obstructive sleep apnea) 01/12/2019   Vitamin D insufficiency 04/21/2019   Thyroid disorder screening 04/21/2019   Constipation, chronic 01/15/2021   Bilateral edema of lower extremity 05/28/2021   Sore throat 10/01/2021   Resolved Ambulatory Problems    Diagnosis Date Noted   Abdominal pain, left upper quadrant 10/28/2014   Diabetes mellitus type II, controlled ([place]) 12/28/2014   Abdominal pain, right upper quadrant 07/25/2015   Diarrhea 07/25/2015   Bloating 07/25/2015   Heaviness of upper extremity 11/28/2016   Abscess of breast, right 01/16/2017   Cellulitis of right breast 01/28/2017   Abscess of right breast 01/28/2017   Past Medical History:  Diagnosis Date   Anxiety state 05/17/2014   Cancer ([place])    Essential hypertension, benign 05/17/2014   Morbid obesity ([place]) 10/13/2015     ROS See HPI.    Objective:     BP (!) 87/74   Pulse 70   Ht 5' (1.524 m)   Wt (!) 311 lb (141.1 kg)   SpO2 95%   BMI 60.74 kg/m  BP Readings from Last 3 Encounters:  04/09/22 (!) 87/74  10/01/21 123/62  05/25/21 117/61   Wt Readings from Last 3 Encounters:  04/09/22 (!) 311 lb (141.1 kg)  10/01/21 (!) 305 lb (138.3  kg)  05/25/21 (!) 309 lb (140.2 kg)    .[name] Kitchen Results for orders placed or performed in visit on 04/09/22  POCT glycosylated hemoglobin (Hb A1C)  Result Value Ref Range   Hemoglobin A1C     HbA1c POC (<> result, manual entry)     HbA1c, POC (prediabetic range)     HbA1c, POC (controlled diabetic range) 6.1 0.0 - 7.0 %  POCT UA - Microalbumin  Result Value Ref Range   Microalbumin Ur, POC 10 mg/L   Creatinine, POC 50 mg/dL   Albumin/Creatinine Ratio, Urine, POC <30      Physical Exam Constitutional:      Appearance: Normal appearance. She is obese.  HENT:     Head: Normocephalic.  Neck:      Vascular: No carotid bruit.  Cardiovascular:     Rate and Rhythm: Normal rate and regular rhythm.     Pulses: Normal pulses.  Pulmonary:     Effort: Pulmonary effort is normal.     Breath sounds: Normal breath sounds.  Musculoskeletal:     Right lower leg: Edema present.     Left lower leg: Edema present.     Comments: Bilateral 1+ankle into mid calf edema with erythema of anterior legs No significant warmth No vesicles/blisters  Neurological:     General: No focal deficit present.     Mental Status: She is alert and oriented to person, place, and time.  Psychiatric:     Comments: tearful         Assessment & Plan:  [name] KitchenMarland KitchenShalaina was seen today for follow-up and diabetes.  Diagnoses and all orders for this visit:  Controlled type 2 diabetes mellitus with hyperglycemia, without long-term current use of insulin (HCC) -     POCT glycosylated hemoglobin (Hb A1C) -     POCT UA - Microalbumin -     Semaglutide,0.25 or 0.'5MG'$ /DOS, (OZEMPIC, 0.25 OR 0.5 MG/DOSE,) 2 MG/3ML SOPN; Inject 0.25 mg into the skin once a week.  Anxiety -     ALPRAZolam (XANAX) 0.5 MG tablet; Take 1 tablet (0.5 mg total) by mouth at bedtime.  Class 3 severe obesity due to excess calories with serious comorbidity and body mass index (BMI) of 60.0 to 69.9 in adult [facility])  Hyperlipidemia associated with type 2 diabetes mellitus (HCC)  Bilateral edema of lower extremity -     furosemide (LASIX) 20 MG tablet; Take 1 tablet (20 mg total) by mouth 2 (two) times daily.  Hypotension, unspecified hypotension type  Depression, recurrent (HCC) -     buPROPion (WELLBUTRIN XL) 150 MG 24 hr tablet; Take 1 tablet (150 mg total) by mouth every morning.  Essential hypertension -     telmisartan (MICARDIS) 40 MG tablet; Take 1 tablet (40 mg total) by mouth daily.      A1C up from last reading but still to goal Pt has not had ozempic in last month BP too low, stop micardis/HCT and start just Micardis  Added lasix  for LEE Placed in unna boots for 1 week Keep feet elevated On statin Normal microalbumin Eye exam UTD Follow up in 3 months  PHQ/GAD not to goal Refilled xanax  Added wellbutrin to help with mood and weight Pt has alcoholic husband and finding it harder and harder to manage her mood Follow up in 4 weeks   Iran Planas, PA-C

## 2022-04-09 NOTE — Patient Instructions (Addendum)
Start wellbutrin XL in the morning.  Continue xanax up to once a day.  Stop micardis/HCT Start micardis '40mg'$  daily and increase lasix to '20mg'$  twice a day.  Take off unna boot in week

## 2022-04-15 ENCOUNTER — Ambulatory Visit: Payer: Medicare Other | Admitting: Physician Assistant

## 2022-04-24 ENCOUNTER — Encounter: Payer: Self-pay | Admitting: General Practice

## 2022-05-07 ENCOUNTER — Ambulatory Visit: Payer: Medicare Other | Admitting: Physician Assistant

## 2022-05-08 ENCOUNTER — Telehealth: Payer: Self-pay | Admitting: General Practice

## 2022-05-08 NOTE — Telephone Encounter (Signed)
Transition Care Management Follow-up Telephone Call Date of discharge and from where: 05/04/22 from [facility] How have you been since you were released from the hospital? Doing better. She is taking her antibiotics as prescribed.  Any questions or concerns? No  Items Reviewed: Did the pt receive and understand the discharge instructions provided? Yes  Medications obtained and verified? No  Other? No  Any new allergies since your discharge? No  Dietary orders reviewed? Yes Do you have support at home? Yes   Home Care and Equipment/Supplies: Were home health services ordered? no   Functional Questionnaire: (I = Independent and D = Dependent) ADLs: i  Bathing/Dressing- i  Meal Prep- i  Eating- i  Maintaining continence- i  Transferring/Ambulation- i  Managing Meds- i  Follow up appointments reviewed:  PCP Hospital f/u appt confirmed? Yes  Scheduled to see Iran Planas , PA on 05/15/22. [facility] f/u appt confirmed? No  Are transportation arrangements needed? No  If their condition worsens, is the pt aware to call PCP or go to the Emergency Dept.? Yes Was the patient provided with contact information for the PCP's office or ED? Yes Was to pt encouraged to call back with questions or concerns? Yes

## 2022-05-15 ENCOUNTER — Encounter: Payer: Self-pay | Admitting: Physician Assistant

## 2022-05-15 ENCOUNTER — Ambulatory Visit: Payer: Medicare Other | Admitting: Physician Assistant

## 2022-05-15 ENCOUNTER — Other Ambulatory Visit: Payer: Self-pay | Admitting: Physician Assistant

## 2022-05-15 VITALS — BP 147/76 | HR 65 | Ht 60.0 in | Wt 311.0 lb

## 2022-05-15 DIAGNOSIS — L03119 Cellulitis of unspecified part of limb: Secondary | ICD-10-CM | POA: Diagnosis not present

## 2022-05-15 DIAGNOSIS — R600 Localized edema: Secondary | ICD-10-CM | POA: Diagnosis not present

## 2022-05-15 DIAGNOSIS — F339 Major depressive disorder, recurrent, unspecified: Secondary | ICD-10-CM

## 2022-05-15 DIAGNOSIS — F419 Anxiety disorder, unspecified: Secondary | ICD-10-CM

## 2022-05-15 DIAGNOSIS — L270 Generalized skin eruption due to drugs and medicaments taken internally: Secondary | ICD-10-CM | POA: Insufficient documentation

## 2022-05-15 DIAGNOSIS — K2100 Gastro-esophageal reflux disease with esophagitis, without bleeding: Secondary | ICD-10-CM

## 2022-05-15 DIAGNOSIS — T361X5A Adverse effect of cephalosporins and other beta-lactam antibiotics, initial encounter: Secondary | ICD-10-CM

## 2022-05-15 MED ORDER — DOXYCYCLINE HYCLATE 100 MG PO TABS: 100.0000 mg | ORAL_TABLET | Freq: Two times a day (BID) | ORAL | 0 refills | Status: DC

## 2022-05-15 MED ORDER — BUPROPION HCL ER (XL) 150 MG PO TB24: 150.0000 mg | ORAL_TABLET | ORAL | 1 refills | Status: DC

## 2022-05-15 NOTE — Progress Notes (Signed)
Established Patient Office Visit  Subjective   Patient ID: Tamara Greer, female    DOB: 01/17/1966  Age: 56 y.o. MRN: [id number]  Chief Complaint  Patient presents with   Depression    Depression        Pt is a 56 y/o obese female presenting to clinic for a depression and anxiety  f/u. Pt reports doing much better and is tolerating Wellbutrin with xanax.   On 05/04/22 pt was seen in ED for cellulitis in bilateral lower extremities and was started on Keflex. Today her cellulitis has improved; however, pt complains of a new rash that began on her left arm 2 days ago and has now spread to both upper extremities. She reports the rash is warm to touch, but not itchy or painful. On Monday, she also received nerve blocks in bilateral knees for osteoarthritis. This has improved her knee pain and she has been trying to remain active.  .. Active Ambulatory Problems    Diagnosis Date Noted   Endometrial cancer ([place]) 05/16/2014   Essential hypertension 05/17/2014   Insomnia 05/17/2014   Anxiety 05/17/2014   Right tibial fracture 05/17/2014   Esophageal reflux 05/17/2014   Vitamin D deficiency 05/19/2014   Elevated liver enzymes 05/19/2014   Primary osteoarthritis of both knees 05/31/2014   BPV (benign positional vertigo) 09/30/2014   Fatty liver disease, nonalcoholic 40/34/7425   Abdominal aortic aneurysm ([place]) 10/21/2014   LUQ pain 12/28/2014   DDD (degenerative disc disease), cervical 08/08/2015   Muscle spasms of head or neck 08/08/2015   Notalgia 08/08/2015   Class 3 severe obesity due to excess calories with serious comorbidity and body mass index (BMI) of 60.0 to 69.9 in adult ([place]) 10/13/2015   Pre-diabetes 10/13/2015   Hyperlipidemia 10/13/2015   MRSA infection 01/28/2017   Angina pectoris ([place])    No energy 10/23/2017   Controlled type 2 diabetes mellitus with hyperglycemia, without long-term current use of insulin ([place]) 11/21/2017   Medication side effect 11/21/2017    Hypotension 01/19/2018   OSA (obstructive sleep apnea) 01/12/2019   Vitamin D insufficiency 04/21/2019   Thyroid disorder screening 04/21/2019   Constipation, chronic 01/15/2021   Bilateral edema of lower extremity 05/28/2021   Sore throat 10/01/2021   Cephalosporin drug rash 05/15/2022   Resolved Ambulatory Problems    Diagnosis Date Noted   Abdominal pain, left upper quadrant 10/28/2014   Diabetes mellitus type II, controlled ([place]) 12/28/2014   Abdominal pain, right upper quadrant 07/25/2015   Diarrhea 07/25/2015   Bloating 07/25/2015   Heaviness of upper extremity 11/28/2016   Abscess of breast, right 01/16/2017   Cellulitis of right breast 01/28/2017   Abscess of right breast 01/28/2017   Past Medical History:  Diagnosis Date   Anxiety state 05/17/2014   Cancer ([place])    Essential hypertension, benign 05/17/2014   Morbid obesity ([place]) 10/13/2015     Review of Systems  Psychiatric/Behavioral:  Positive for depression.    Positive for rash, negative for pruritis.   Objective:     BP (!) 147/76   Pulse 65   Ht 5' (1.524 m)   Wt (!) 311 lb (141.1 kg)   SpO2 97%   BMI 60.74 kg/m  BP Readings from Last 3 Encounters:  05/15/22 (!) 147/76  04/09/22 (!) 87/74  10/01/21 123/62   Wt Readings from Last 3 Encounters:  05/15/22 (!) 311 lb (141.1 kg)  04/09/22 (!) 311 lb (141.1 kg)  10/01/21 (!) 305 lb (138.3 kg)    .[name] Kitchen  05/15/2022    9:40 AM 04/09/2022    2:40 PM 01/15/2021   11:15 AM 02/18/2020   10:08 AM 04/19/2019   11:35 AM  Depression screen PHQ 2/9  Decreased Interest 0 3 0 2 1  Down, Depressed, Hopeless 0 3 0 1 1  PHQ - 2 Score 0 6 0 3 2  Altered sleeping '1 2  1 2  '$ Tired, decreased energy 0 '2  1 1  '$ Change in appetite 0 2  1 0  Feeling bad or failure about yourself  0 3  1 0  Trouble concentrating 0 2  0 0  Moving slowly or fidgety/restless 0 3  0 0  Suicidal thoughts 0 0  0 0  PHQ-9 Score '1 20  7 5  '$ Difficult doing work/chores Not difficult at all  Somewhat difficult  Not difficult at all Not difficult at all   .[name] Kitchen    05/15/2022    9:40 AM 04/09/2022    2:40 PM 02/18/2020   10:08 AM 04/19/2019   11:36 AM  GAD 7 : Generalized Anxiety Score  Nervous, Anxious, on Edge 1 2 0 1  Control/stop worrying 1 3 0 1  Worry too much - different things 0 3 0 1  Trouble relaxing 0 3 0 1  Restless 0 '2 2 1  '$ Easily annoyed or irritable 1 3 0 1  Afraid - awful might happen 0 3 0 1  Total GAD 7 Score '3 19 2 7  '$ Anxiety Difficulty Not difficult at all Somewhat difficult Not difficult at all Not difficult at all      Physical Exam Constitutional:      General: She is not in acute distress.    Appearance: She is obese. She is not ill-appearing.  HENT:     Head: Normocephalic and atraumatic.  Cardiovascular:     Rate and Rhythm: Normal rate and regular rhythm.     Pulses: Normal pulses.     Heart sounds: Normal heart sounds.  Pulmonary:     Effort: Pulmonary effort is normal.     Breath sounds: Normal breath sounds.  Musculoskeletal:     Comments: Bilateral lower extremity 2+ pitting edema with macular erythema of anterior lower legs. Not warm to touch.   Skin:    Findings: Rash present.     Comments: Macular blanchable rash of bilateral arms diffuse. No tenderness to palpation.   Neurological:     Mental Status: She is alert and oriented to person, place, and time.  Psychiatric:        Mood and Affect: Mood normal.          Assessment & Plan:  [name] KitchenMarland KitchenChia was seen today for depression.  Diagnoses and all orders for this visit:  Anxiety  Depression, recurrent (HCC) -     buPROPion (WELLBUTRIN XL) 150 MG 24 hr tablet; Take 1 tablet (150 mg total) by mouth every morning.  Bilateral edema of lower extremity  Cellulitis of lower extremity, unspecified laterality -     doxycycline (VIBRA-TABS) 100 MG tablet; Take 1 tablet (100 mg total) by mouth 2 (two) times daily.  Cephalosporin drug rash -     doxycycline (VIBRA-TABS) 100 MG  tablet; Take 1 tablet (100 mg total) by mouth 2 (two) times daily.   PHQ/GAD much improvement on wellbutrin.  Sent refills  Cellulitis seems to be improving but possible drug rash from keflex Stop keflex and finish on doxycycline Continue lasix Reminder to keep legs elevated and compressed  Follow up as needed or if symptoms persist or worsen.

## 2022-07-16 ENCOUNTER — Ambulatory Visit: Payer: Medicare Other | Admitting: Physician Assistant

## 2022-07-17 ENCOUNTER — Ambulatory Visit: Payer: Medicare Other | Admitting: Physician Assistant

## 2022-08-06 ENCOUNTER — Other Ambulatory Visit: Payer: Self-pay | Admitting: Physician Assistant

## 2022-08-06 DIAGNOSIS — L03119 Cellulitis of unspecified part of limb: Secondary | ICD-10-CM

## 2022-08-06 DIAGNOSIS — L270 Generalized skin eruption due to drugs and medicaments taken internally: Secondary | ICD-10-CM

## 2022-09-09 ENCOUNTER — Other Ambulatory Visit: Payer: Self-pay | Admitting: Physician Assistant

## 2022-09-09 DIAGNOSIS — I10 Essential (primary) hypertension: Secondary | ICD-10-CM

## 2022-10-04 ENCOUNTER — Other Ambulatory Visit: Payer: Self-pay | Admitting: Physician Assistant

## 2022-10-04 DIAGNOSIS — F419 Anxiety disorder, unspecified: Secondary | ICD-10-CM

## 2022-10-04 NOTE — Telephone Encounter (Signed)
Last written 04/09/2022 #30 with 5 refills Last appt 05/15/2022

## 2022-10-11 ENCOUNTER — Other Ambulatory Visit: Payer: Self-pay | Admitting: Physician Assistant

## 2022-10-11 DIAGNOSIS — L03119 Cellulitis of unspecified part of limb: Secondary | ICD-10-CM

## 2022-10-11 DIAGNOSIS — L270 Generalized skin eruption due to drugs and medicaments taken internally: Secondary | ICD-10-CM

## 2022-10-24 ENCOUNTER — Other Ambulatory Visit: Payer: Self-pay | Admitting: Physician Assistant

## 2022-10-24 DIAGNOSIS — I10 Essential (primary) hypertension: Secondary | ICD-10-CM

## 2022-10-30 ENCOUNTER — Ambulatory Visit: Payer: Medicare Other | Admitting: Physician Assistant

## 2022-10-30 ENCOUNTER — Other Ambulatory Visit: Payer: Self-pay | Admitting: Physician Assistant

## 2022-10-30 ENCOUNTER — Encounter: Payer: Self-pay | Admitting: Physician Assistant

## 2022-10-30 VITALS — BP 138/90 | HR 65 | Ht 60.0 in | Wt 310.0 lb

## 2022-10-30 DIAGNOSIS — E538 Deficiency of other specified B group vitamins: Secondary | ICD-10-CM

## 2022-10-30 DIAGNOSIS — E1165 Type 2 diabetes mellitus with hyperglycemia: Secondary | ICD-10-CM

## 2022-10-30 DIAGNOSIS — E1169 Type 2 diabetes mellitus with other specified complication: Secondary | ICD-10-CM

## 2022-10-30 DIAGNOSIS — E559 Vitamin D deficiency, unspecified: Secondary | ICD-10-CM | POA: Diagnosis not present

## 2022-10-30 DIAGNOSIS — Z1329 Encounter for screening for other suspected endocrine disorder: Secondary | ICD-10-CM

## 2022-10-30 DIAGNOSIS — M5030 Other cervical disc degeneration, unspecified cervical region: Secondary | ICD-10-CM

## 2022-10-30 DIAGNOSIS — F419 Anxiety disorder, unspecified: Secondary | ICD-10-CM

## 2022-10-30 DIAGNOSIS — K2100 Gastro-esophageal reflux disease with esophagitis, without bleeding: Secondary | ICD-10-CM

## 2022-10-30 DIAGNOSIS — F339 Major depressive disorder, recurrent, unspecified: Secondary | ICD-10-CM

## 2022-10-30 DIAGNOSIS — I10 Essential (primary) hypertension: Secondary | ICD-10-CM | POA: Diagnosis not present

## 2022-10-30 DIAGNOSIS — K5909 Other constipation: Secondary | ICD-10-CM

## 2022-10-30 DIAGNOSIS — E785 Hyperlipidemia, unspecified: Secondary | ICD-10-CM

## 2022-10-30 DIAGNOSIS — Z79899 Other long term (current) drug therapy: Secondary | ICD-10-CM

## 2022-10-30 DIAGNOSIS — R600 Localized edema: Secondary | ICD-10-CM

## 2022-10-30 LAB — POCT GLYCOSYLATED HEMOGLOBIN (HGB A1C): Hemoglobin A1C: 7.3 % — AB (ref 4.0–5.6)

## 2022-10-30 MED ORDER — METOPROLOL SUCCINATE ER 25 MG PO TB24: ORAL_TABLET | ORAL | 3 refills | Status: DC

## 2022-10-30 MED ORDER — FUROSEMIDE 20 MG PO TABS: 20.0000 mg | ORAL_TABLET | Freq: Two times a day (BID) | ORAL | 1 refills | Status: DC

## 2022-10-30 MED ORDER — SEMAGLUTIDE (1 MG/DOSE) 4 MG/3ML ~~LOC~~ SOPN: 1.0000 mg | PEN_INJECTOR | SUBCUTANEOUS | 0 refills | Status: DC

## 2022-10-30 MED ORDER — TELMISARTAN 40 MG PO TABS: 40.0000 mg | ORAL_TABLET | Freq: Every day | ORAL | 1 refills | Status: DC

## 2022-10-30 MED ORDER — LINACLOTIDE 145 MCG PO CAPS: 145.0000 ug | ORAL_CAPSULE | Freq: Every day | ORAL | 3 refills | Status: DC

## 2022-10-30 MED ORDER — BUPROPION HCL ER (XL) 150 MG PO TB24: 150.0000 mg | ORAL_TABLET | ORAL | 1 refills | Status: DC

## 2022-10-30 NOTE — Telephone Encounter (Signed)
Last written 10/04/2022 for one month, last seen today.

## 2022-10-30 NOTE — Patient Instructions (Addendum)
Increased ozempic to '1mg'$  weekly.  Increase lasix to 2 tablets if needed for swelling.

## 2022-10-31 LAB — CBC WITH DIFFERENTIAL/PLATELET
Absolute Monocytes: 473 cells/uL (ref 200–950)
Basophils Absolute: 32 cells/uL (ref 0–200)
Basophils Relative: 0.5 %
Eosinophils Absolute: 132 cells/uL (ref 15–500)
Eosinophils Relative: 2.1 %
HCT: 37.4 % (ref 35.0–45.0)
Hemoglobin: 12.5 g/dL (ref 11.7–15.5)
Lymphs Abs: 1796 cells/uL (ref 850–3900)
MCH: 31 pg (ref 27.0–33.0)
MCHC: 33.4 g/dL (ref 32.0–36.0)
MCV: 92.8 fL (ref 80.0–100.0)
MPV: 12.8 fL — ABNORMAL HIGH (ref 7.5–12.5)
Monocytes Relative: 7.5 %
Neutro Abs: 3868 cells/uL (ref 1500–7800)
Neutrophils Relative %: 61.4 %
Platelets: 203 10*3/uL (ref 140–400)
RBC: 4.03 10*6/uL (ref 3.80–5.10)
RDW: 12.5 % (ref 11.0–15.0)
Total Lymphocyte: 28.5 %
WBC: 6.3 10*3/uL (ref 3.8–10.8)

## 2022-10-31 LAB — LIPID PANEL W/REFLEX DIRECT LDL
Cholesterol: 164 mg/dL (ref <200)
HDL: 66 mg/dL (ref >=50)
LDL Cholesterol (Calc): 77 mg/dL (calc)
Non-HDL Cholesterol (Calc): 98 mg/dL (calc) (ref <130)
Total CHOL/HDL Ratio: 2.5 (calc) (ref <5.0)
Triglycerides: 130 mg/dL (ref <150)

## 2022-10-31 LAB — COMPLETE METABOLIC PANEL WITH GFR
AG Ratio: 1.8 (calc) (ref 1.0–2.5)
ALT: 16 U/L (ref 6–29)
AST: 14 U/L (ref 10–35)
Albumin: 4.2 g/dL (ref 3.6–5.1)
Alkaline phosphatase (APISO): 105 U/L (ref 37–153)
BUN: 12 mg/dL (ref 7–25)
CO2: 30 mmol/L (ref 20–32)
Calcium: 9.1 mg/dL (ref 8.6–10.4)
Chloride: 104 mmol/L (ref 98–110)
Creat: 0.62 mg/dL (ref 0.50–1.03)
Globulin: 2.4 g/dL (calc) (ref 1.9–3.7)
Glucose, Bld: 117 mg/dL — ABNORMAL HIGH (ref 65–99)
Potassium: 4.3 mmol/L (ref 3.5–5.3)
Sodium: 142 mmol/L (ref 135–146)
Total Bilirubin: 0.6 mg/dL (ref 0.2–1.2)
Total Protein: 6.6 g/dL (ref 6.1–8.1)
eGFR: 103 mL/min/{1.73_m2} (ref >=60)

## 2022-10-31 LAB — TSH: TSH: 2.53 mIU/L (ref 0.40–4.50)

## 2022-10-31 LAB — VITAMIN D 25 HYDROXY (VIT D DEFICIENCY, FRACTURES): Vit D, 25-Hydroxy: 33 ng/mL (ref 30–100)

## 2022-10-31 LAB — VITAMIN B12: Vitamin B-12: 342 pg/mL (ref 200–1100)

## 2022-11-01 NOTE — Progress Notes (Signed)
Tamara Greer,   Kidney, liver, electrolytes look good.  Hemoglobin looks good.  Thyroid looks great.  Cholesterol is up some from 1 year ago but still looks good. Goal LDL is under 70. Right at goal.  Vitamin D has improved and now in low normal range. Make sure taking vitamin D with diary for absorption and can take with K2 OTC. How much are you taking now?  B12 on the low side as well. I would start 1035mg b12 daily. We could give you b12 shot if you would also like that. Start every 6 weeks.

## 2022-11-11 NOTE — Progress Notes (Signed)
Established Patient Office Visit  Subjective   Patient ID: Raymona Broach, female    DOB: [birth year]  Age: 57 y.o. MRN: [id number]  Chief Complaint  Patient presents with   Follow-up   Diabetes    HPI Pt is a 57 yo obese female with T2DM, HTN, HLD, lower extermity edema, OSA who presents to the clinic for follow up.   She is taking medication daily. She is not checking sugars. She continues to have intermittent swelling of lower legs and lately they have felt more red and swollen. No injuries. No worsening SOB. She denies any CP, palpitations, or headaches. She uses her CPAP every night for at least 6 hours.   Active Ambulatory Problems    Diagnosis Date Noted   Endometrial cancer ([place]) 05/16/2014   Essential hypertension 05/17/2014   Insomnia 05/17/2014   Anxiety 05/17/2014   Right tibial fracture 05/17/2014   Esophageal reflux 05/17/2014   Vitamin D deficiency 05/19/2014   Elevated liver enzymes 05/19/2014   Primary osteoarthritis of both knees 05/31/2014   BPV (benign positional vertigo) 09/30/2014   Fatty liver disease, nonalcoholic [id number]   Abdominal aortic aneurysm ([place]) 10/21/2014   LUQ pain 12/28/2014   DDD (degenerative disc disease), cervical 08/08/2015   Muscle spasms of head or neck 08/08/2015   Notalgia 08/08/2015   Class 3 severe obesity due to excess calories with serious comorbidity and body mass index (BMI) of 60.0 to 69.9 in adult ([place]) 10/13/2015   Pre-diabetes 10/13/2015   Hyperlipidemia 10/13/2015   MRSA infection 01/28/2017   Angina pectoris ([place])    No energy 10/23/2017   Controlled type 2 diabetes mellitus with hyperglycemia, without long-term current use of insulin ([place]) 11/21/2017   Medication side effect 11/21/2017   Hypotension 01/19/2018   OSA (obstructive sleep apnea) 01/12/2019   Vitamin D insufficiency 04/21/2019   Thyroid disorder screening 04/21/2019   Constipation, chronic 01/15/2021   Bilateral edema of lower extremity  05/28/2021   Sore throat 10/01/2021   Cephalosporin drug rash 05/15/2022   Diabetes mellitus type 2, controlled, without complications ([place]) [id number]   Stiffness of right knee 09/12/2018   Primary osteoarthritis of left knee 09/12/2018   Idiopathic sleep related nonobstructive alveolar hypoventilation 09/12/2018   Closed fracture of left tibial plateau with routine healing 09/12/2018   Resolved Ambulatory Problems    Diagnosis Date Noted   Abdominal pain, left upper quadrant 10/28/2014   Diabetes mellitus type II, controlled ([place]) 12/28/2014   Abdominal pain, right upper quadrant 07/25/2015   Diarrhea 07/25/2015   Bloating 07/25/2015   Heaviness of upper extremity 11/28/2016   Abscess of breast, right 01/16/2017   Cellulitis of right breast 01/28/2017   Abscess of right breast 01/28/2017   Past Medical History:  Diagnosis Date   Anxiety state 05/17/2014   Cancer ([place])    Essential hypertension, benign 05/17/2014   Morbid obesity ([place]) 10/13/2015    ROS See HPI.    Objective:     BP (!) 138/90   Pulse 65   Ht 5' (1.524 m)   Wt (!) 310 lb (140.6 kg)   SpO2 99%   BMI 60.54 kg/m  BP Readings from Last 3 Encounters:  10/30/22 (!) 138/90  05/15/22 (!) 147/76  04/09/22 (!) 87/74   Wt Readings from Last 3 Encounters:  10/30/22 (!) 310 lb (140.6 kg)  05/15/22 (!) 311 lb (141.1 kg)  04/09/22 (!) 311 lb (141.1 kg)     .[name] Kitchen Results for orders placed or performed in  visit on 10/30/22  TSH  Result Value Ref Range   TSH 2.53 0.40 - 4.50 mIU/L  Lipid Panel w/reflex Direct LDL  Result Value Ref Range   Cholesterol 164 <200 mg/dL   HDL 66 > OR = 50 mg/dL   Triglycerides 130 <150 mg/dL   LDL Cholesterol (Calc) 77 mg/dL (calc)   Total CHOL/HDL Ratio 2.5 <5.0 (calc)   Non-HDL Cholesterol (Calc) 98 <130 mg/dL (calc)  COMPLETE METABOLIC PANEL WITH GFR  Result Value Ref Range   Glucose, Bld 117 (H) 65 - 99 mg/dL   BUN 12 7 - 25 mg/dL   Creat 0.62 0.50 - 1.03 mg/dL   eGFR  103 > OR = 60 mL/min/1.64m   BUN/Creatinine Ratio SEE NOTE: 6 - 22 (calc)   Sodium 142 135 - 146 mmol/L   Potassium 4.3 3.5 - 5.3 mmol/L   Chloride 104 98 - 110 mmol/L   CO2 30 20 - 32 mmol/L   Calcium 9.1 8.6 - 10.4 mg/dL   Total Protein 6.6 6.1 - 8.1 g/dL   Albumin 4.2 3.6 - 5.1 g/dL   Globulin 2.4 1.9 - 3.7 g/dL (calc)   AG Ratio 1.8 1.0 - 2.5 (calc)   Total Bilirubin 0.6 0.2 - 1.2 mg/dL   Alkaline phosphatase (APISO) 105 37 - 153 U/L   AST 14 10 - 35 U/L   ALT 16 6 - 29 U/L  CBC with Differential/Platelet  Result Value Ref Range   WBC 6.3 3.8 - 10.8 Thousand/uL   RBC 4.03 3.80 - 5.10 Million/uL   Hemoglobin 12.5 11.7 - 15.5 g/dL   HCT 37.4 35.0 - 45.0 %   MCV 92.8 80.0 - 100.0 fL   MCH 31.0 27.0 - 33.0 pg   MCHC 33.4 32.0 - 36.0 g/dL   RDW 12.5 11.0 - 15.0 %   Platelets 203 140 - 400 Thousand/uL   MPV 12.8 (H) 7.5 - 12.5 fL   Neutro Abs 3,868 1,500 - 7,800 cells/uL   Lymphs Abs 1,796 850 - 3,900 cells/uL   Absolute Monocytes 473 200 - 950 cells/uL   Eosinophils Absolute 132 15 - 500 cells/uL   Basophils Absolute 32 0 - 200 cells/uL   Neutrophils Relative % 61.4 %   Total Lymphocyte 28.5 %   Monocytes Relative 7.5 %   Eosinophils Relative 2.1 %   Basophils Relative 0.5 %  Vitamin D (25 hydroxy)  Result Value Ref Range   Vit D, 25-Hydroxy 33 30 - 100 ng/mL  Vitamin B12  Result Value Ref Range   Vitamin B-12 342 200 - 1,100 pg/mL  POCT glycosylated hemoglobin (Hb A1C)  Result Value Ref Range   Hemoglobin A1C 7.3 (A) 4.0 - 5.6 %   HbA1c POC (<> result, manual entry)     HbA1c, POC (prediabetic range)     HbA1c, POC (controlled diabetic range)      Physical Exam Constitutional:      Appearance: Normal appearance.  HENT:     Head: Normocephalic.  Cardiovascular:     Rate and Rhythm: Normal rate and regular rhythm.     Heart sounds: Murmur heard.  Pulmonary:     Effort: Pulmonary effort is normal.     Breath sounds: Normal breath sounds.  Musculoskeletal:      Cervical back: Normal range of motion and neck supple.     Right lower leg: Edema present.     Left lower leg: Edema present.     Comments: Bilateral 2+ edema with  redness and tenderness to lower extremity.   Neurological:     General: No focal deficit present.     Mental Status: She is alert and oriented to person, place, and time.  Psychiatric:        Mood and Affect: Mood normal.        Assessment & Plan:  .Kristlyn was seen today for follow-up and diabetes.  Diagnoses and all orders for this visit:  Controlled type 2 diabetes mellitus with hyperglycemia, without long-term current use of insulin (HCC) -     POCT glycosylated hemoglobin (Hb A1C) -     Semaglutide, 1 MG/DOSE, 4 MG/3ML SOPN; Inject 1 mg as directed once a week.  Hyperlipidemia associated with type 2 diabetes mellitus (HCC) -     Lipid Panel w/reflex Direct LDL  Essential hypertension -     COMPLETE METABOLIC PANEL WITH GFR -     metoprolol succinate (TOPROL-XL) 25 MG 24 hr tablet; TAKE 1 TABLET BY MOUTH ONCE DAILY. -     telmisartan (MICARDIS) 40 MG tablet; Take 1 tablet (40 mg total) by mouth daily.  Vitamin D insufficiency -     Vitamin D (25 hydroxy)  B12 deficiency -     Vitamin B12  Medication management -     TSH -     Lipid Panel w/reflex Direct LDL -     COMPLETE METABOLIC PANEL WITH GFR -     CBC with Differential/Platelet -     POCT glycosylated hemoglobin (Hb A1C)  Thyroid disorder screen -     TSH  Depression, recurrent (HCC) -     buPROPion (WELLBUTRIN XL) 150 MG 24 hr tablet; Take 1 tablet (150 mg total) by mouth every morning.  Bilateral edema of lower extremity -     furosemide (LASIX) 20 MG tablet; Take 1 tablet (20 mg total) by mouth 2 (two) times daily.  Constipation, chronic -     linaclotide (LINZESS) 145 MCG CAPS capsule; Take 1 capsule (145 mcg total) by mouth daily before breakfast.  Gastroesophageal reflux disease with esophagitis without hemorrhage  DDD  (degenerative disc disease), cervical   A1C not to goal Increased ozempic to '1mg'$  weekly BP not to goal stay on telmisartan and keep BP at home Continue on metoprolol for rate control Fasting labs ordered On statin Lasix increased some of lower extermity edema Wrapped with unna boot today, bilaterally Linzess for constipation Needs eye exam Declines flu and covid vaccines Follow up in 3 months   Return in about 3 months (around 01/28/2023).    Iran Planas, PA-C

## 2022-12-12 ENCOUNTER — Telehealth: Payer: Self-pay | Admitting: Physician Assistant

## 2022-12-12 NOTE — Telephone Encounter (Signed)
Called patient to schedule Medicare Annual Wellness Visit (AWV). Left message for patient to call back and schedule Medicare Annual Wellness Visit (AWV).  Last date of AWV: Never  Please schedule an appointment at any time with [facility].  If any questions, please contact me at [phone].  Thank you ,  Morgan Jessup Patient Access Advocate II Direct Dial: [phone]  

## 2022-12-20 ENCOUNTER — Other Ambulatory Visit: Payer: Self-pay | Admitting: Physician Assistant

## 2022-12-20 DIAGNOSIS — E782 Mixed hyperlipidemia: Secondary | ICD-10-CM

## 2023-01-15 ENCOUNTER — Ambulatory Visit: Payer: Medicare Other | Admitting: Physician Assistant

## 2023-01-15 ENCOUNTER — Encounter: Payer: Self-pay | Admitting: Physician Assistant

## 2023-01-15 VITALS — BP 121/72 | HR 79 | Temp 98.8°F | Resp 20 | Ht 60.0 in | Wt 309.0 lb

## 2023-01-15 DIAGNOSIS — L259 Unspecified contact dermatitis, unspecified cause: Secondary | ICD-10-CM | POA: Diagnosis not present

## 2023-01-15 DIAGNOSIS — E538 Deficiency of other specified B group vitamins: Secondary | ICD-10-CM

## 2023-01-15 MED ORDER — METHYLPREDNISOLONE 4 MG PO TBPK: ORAL_TABLET | ORAL | 0 refills | Status: DC

## 2023-01-15 MED ORDER — METHYLPREDNISOLONE SODIUM SUCC 125 MG IJ SOLR
125.0000 mg | Freq: Once | INTRAMUSCULAR | Status: AC
  Administered 2023-01-15: 125 mg via INTRAMUSCULAR

## 2023-01-15 MED ORDER — CYANOCOBALAMIN 1000 MCG/ML IJ SOLN
1000.0000 ug | Freq: Once | INTRAMUSCULAR | Status: AC
  Administered 2023-01-15: 1000 ug via INTRAMUSCULAR

## 2023-01-15 NOTE — Patient Instructions (Signed)
Contact Dermatitis Dermatitis is redness, soreness, and swelling (inflammation) of the skin. Contact dermatitis is a reaction to certain substances that touch the skin. There are two types of this condition: Irritant contact dermatitis. This is the most common type. It happens when something irritates your skin, such as when your hands get dry from washing them too often with soap. You can get this type of reaction even if you have not been exposed to the irritant before. Allergic contact dermatitis. This type is caused by a substance that you are allergic to, such as poison ivy. It occurs when you have been exposed to the substance (allergen) and form a sensitivity to it. In some cases, the reaction may start soon after your first exposure to the allergen. In other cases, it may not start until you are exposed to the allergen again. It may then occur every time you are exposed to the allergen in the future. What are the causes? Irritant contact dermatitis is often caused by exposure to: Makeup. Soaps, detergents, and bleaches. Acids. Metal salts, such as nickel. Allergic contact dermatitis is often caused by exposure to: Poisonous plants. Chemicals. Jewelry. Latex. Medicines. Preservatives in products, such as clothes. What increases the risk? You are more likely to get this condition if you have: A job that exposes you to irritants or allergens. Certain medical conditions. These include asthma and eczema. What are the signs or symptoms? Symptoms of this condition may occur in any place on your body that has been touched by the irritant. Symptoms include: Dryness, flaking, or cracking. Redness. Itching. Pain or a burning feeling. Blisters. Drainage of small amounts of blood or clear fluid from skin cracks. With allergic contact dermatitis, there may also be swelling in areas such as the eyelids, mouth, or genitals. How is this diagnosed? This condition is diagnosed with a medical  history and physical exam. A patch skin test may be done to help figure out the cause. If the condition is related to your job, you may need to see an expert in health problems in the workplace (occupational medicine specialist). How is this treated? This condition is treated by staying away from the cause of the reaction and protecting your skin from further contact. Treatment may also include: Steroid creams or ointments. Steroid medicines may need be taken by mouth (orally) in more severe cases. Antibiotics or medicines applied to the skin to kill bacteria (antibacterial ointments). These may be needed if a skin infection is present. Antihistamines. These may be taken orally or put on as a lotion to ease itching. A bandage (dressing). Follow these instructions at home: Skin care Moisturize your skin as needed. Put cool, wet cloths (cool compresses) on the affected areas. Try applying baking soda paste to your skin. Stir water into baking soda until it has the consistency of a paste. Do not scratch your skin. Avoid friction to the affected area. Avoid the use of soaps, perfumes, and dyes. Check the affected areas every day for signs of infection. Check for: More redness, swelling, or pain. More fluid or blood. Warmth. Pus or a bad smell. Medicines Take or apply over-the-counter and prescription medicines only as told by your health care provider. If you were prescribed antibiotics, take or apply them as told by your health care provider. Do not stop using the antibiotic even if you start to feel better. Bathing Try taking a bath with: Epsom salts. Follow the instructions on the packaging. You can get these at your local pharmacy   or grocery store. Baking soda. Pour a small amount into the bath as told by your health care provider. Colloidal oatmeal. Follow the instructions on the packaging. You can get this at your local pharmacy or grocery store. Bathe less often. This may mean bathing  every other day. Bathe in lukewarm water. Avoid using hot water. Bandage care If you were given a dressing, change it as told by your health care provider. Wash your hands with soap and water for at least 20 seconds before and after you change your dressing. If soap and water are not available, use hand sanitizer. General instructions Avoid the substance that caused your reaction. If you do not know what caused it, keep a journal to try to track what caused it. Write down: What you eat and drink. What cosmetics you use. What you wear in the affected area. This includes jewelry. Contact a health care provider if: Your condition does not get better with treatment. Your condition gets worse. You have any signs of infection. You have a fever. You have new symptoms. Your bone or joint under the affected area becomes painful after the skin has healed. Get help right away if: You notice red streaks coming from the affected area. The affected area turns darker. You have trouble breathing. This information is not intended to replace advice given to you by your health care provider. Make sure you discuss any questions you have with your health care provider. Document Revised: 02/22/2022 Document Reviewed: 02/22/2022 Elsevier Patient Education  2023 Elsevier Inc.  

## 2023-01-15 NOTE — Progress Notes (Signed)
Acute Office Visit  Subjective:     Patient ID: Tamara Greer, female    DOB: 10/10/1965, 57 y.o.   MRN: [id number]  Chief Complaint  Patient presents with   Follow-up   Rash    BACK AND CHEST    HPI Patient is in today for itchy rash on chest, neck, bilateral arms and back for 5 days. She is not aware of any trigger. She has been outside a lot more lately. No contact with anyone with rash. No recent illness. No URI symptoms. No detergent or lotion changes. No medication changes except not having ozempic for 2 weeks.   Request b12 shot today.   .. Active Ambulatory Problems    Diagnosis Date Noted   Endometrial cancer ([facility]) 05/16/2014   Essential hypertension 05/17/2014   Insomnia 05/17/2014   Anxiety 05/17/2014   Right tibial fracture 05/17/2014   Esophageal reflux 05/17/2014   Vitamin D deficiency 05/19/2014   Elevated liver enzymes 05/19/2014   Primary osteoarthritis of both knees 05/31/2014   BPV (benign positional vertigo) 09/30/2014   Fatty liver disease, nonalcoholic 10/14/2014   Abdominal aortic aneurysm ([facility]) 10/21/2014   LUQ pain 12/28/2014   DDD (degenerative disc disease), cervical 08/08/2015   Muscle spasms of head or neck 08/08/2015   Notalgia 08/08/2015   Class 3 severe obesity due to excess calories with serious comorbidity and body mass index (BMI) of 60.0 to 69.9 in adult ([facility]) 10/13/2015   Pre-diabetes 10/13/2015   Hyperlipidemia 10/13/2015   MRSA infection 01/28/2017   Angina pectoris ([facility])    No energy 10/23/2017   Controlled type 2 diabetes mellitus with hyperglycemia, without long-term current use of insulin ([facility]) 11/21/2017   Medication side effect 11/21/2017   Hypotension 01/19/2018   OSA (obstructive sleep apnea) 01/12/2019   Vitamin D insufficiency 04/21/2019   Thyroid disorder screening 04/21/2019   Constipation, chronic 01/15/2021   Bilateral edema of lower extremity 05/28/2021   Sore throat 10/01/2021   Cephalosporin drug rash  05/15/2022   Diabetes mellitus type 2, controlled, without complications ([facility]) 09/12/2018   Stiffness of right knee 09/12/2018   Primary osteoarthritis of left knee 09/12/2018   Idiopathic sleep related nonobstructive alveolar hypoventilation 09/12/2018   Closed fracture of left tibial plateau with routine healing 09/12/2018   Resolved Ambulatory Problems    Diagnosis Date Noted   Abdominal pain, left upper quadrant 10/28/2014   Diabetes mellitus type II, controlled ([facility]) 12/28/2014   Abdominal pain, right upper quadrant 07/25/2015   Diarrhea 07/25/2015   Bloating 07/25/2015   Heaviness of upper extremity 11/28/2016   Abscess of breast, right 01/16/2017   Cellulitis of right breast 01/28/2017   Abscess of right breast 01/28/2017   Past Medical History:  Diagnosis Date   Anxiety state 05/17/2014   Cancer ([facility])    Essential hypertension, benign 05/17/2014   Morbid obesity ([facility]) 10/13/2015     ROS  See HPI.     Objective:    BP 121/72   Pulse 79   Temp 98.8 F (37.1 C)   Resp 20   Ht 5' (1.524 m)   Wt (!) 309 lb (140.2 kg)   SpO2 96%   BMI 60.35 kg/m  BP Readings from Last 3 Encounters:  01/15/23 121/72  10/30/22 (!) 138/90  05/15/22 (!) 147/76   Wt Readings from Last 3 Encounters:  01/15/23 (!) 309 lb (140.2 kg)  10/30/22 (!) 310 lb (140.6 kg)  05/15/22 (!) 311 lb (141.1 kg)  Physical Exam Constitutional:      Appearance: Normal appearance. She is obese.  HENT:     Mouth/Throat:     Mouth: Mucous membranes are moist.     Comments: No tongue or lip swelling.  Eyes:     Conjunctiva/sclera: Conjunctivae normal.  Cardiovascular:     Rate and Rhythm: Normal rate and regular rhythm.     Pulses: Normal pulses.  Pulmonary:     Effort: Pulmonary effort is normal.     Breath sounds: Normal breath sounds.  Skin:    Comments: Macular papular erythematous rash around neck, chest, arms, back   Neurological:     Mental Status: She is alert and oriented to  person, place, and time.  Psychiatric:        Mood and Affect: Mood normal.          Assessment & Plan:  Tamara Greer KitchenMarland KitchenEmalin was seen today for follow-up and rash.  Diagnoses and all orders for this visit:  Contact dermatitis, unspecified contact dermatitis type, unspecified trigger -     methylPREDNISolone (MEDROL DOSEPAK) 4 MG TBPK tablet; Take as directed by package insert. -     methylPREDNISolone sodium succinate (SOLU-MEDROL) 125 mg/2 mL injection 125 mg  B12 deficiency -     cyanocobalamin (VITAMIN B12) injection 1,000 mcg   Unclear etiology of rash that appears like contact dermatitis ? If from being outside this weekend No clear medication link Solumedrol given today with medrol dose pack to start tomorrow Benadryl for itching B12 shot given today  Follow up as needed if symptoms persist or worsen  Tandy Gaw, PA-C

## 2023-01-20 ENCOUNTER — Encounter: Payer: Self-pay | Admitting: Physician Assistant

## 2023-01-21 MED ORDER — CLOBETASOL PROPIONATE 0.05 % EX CREA: 1.0000 | TOPICAL_CREAM | Freq: Two times a day (BID) | CUTANEOUS | 1 refills | Status: AC

## 2023-02-04 ENCOUNTER — Ambulatory Visit: Payer: Medicare Other | Admitting: Physician Assistant

## 2023-02-17 ENCOUNTER — Ambulatory Visit: Payer: Medicare Other | Admitting: Physician Assistant

## 2023-02-17 ENCOUNTER — Encounter: Payer: Self-pay | Admitting: Physician Assistant

## 2023-02-17 VITALS — BP 143/72 | HR 63 | Ht 60.0 in | Wt 309.0 lb

## 2023-02-17 DIAGNOSIS — R21 Rash and other nonspecific skin eruption: Secondary | ICD-10-CM

## 2023-02-17 DIAGNOSIS — I872 Venous insufficiency (chronic) (peripheral): Secondary | ICD-10-CM

## 2023-02-17 DIAGNOSIS — R600 Localized edema: Secondary | ICD-10-CM

## 2023-02-17 DIAGNOSIS — Z794 Long term (current) use of insulin: Secondary | ICD-10-CM

## 2023-02-17 DIAGNOSIS — I358 Other nonrheumatic aortic valve disorders: Secondary | ICD-10-CM | POA: Insufficient documentation

## 2023-02-17 DIAGNOSIS — E1165 Type 2 diabetes mellitus with hyperglycemia: Secondary | ICD-10-CM | POA: Diagnosis not present

## 2023-02-17 LAB — POCT GLYCOSYLATED HEMOGLOBIN (HGB A1C): Hemoglobin A1C: 6.9 % — AB (ref 4.0–5.6)

## 2023-02-17 MED ORDER — OZEMPIC (0.25 OR 0.5 MG/DOSE) 2 MG/3ML ~~LOC~~ SOPN: 0.5000 mg | PEN_INJECTOR | SUBCUTANEOUS | 1 refills | Status: DC

## 2023-02-17 NOTE — Patient Instructions (Signed)
Chronic Venous Insufficiency Chronic venous insufficiency is a condition that causes the veins in the legs to struggle to pump blood from the legs to the heart. It is also called venous stasis. This condition can happen when the vein walls are stretched, weakened, or damaged. It can also happen when the valves inside the vein are damaged. With the right treatment, you should be able to still lead an active life. What are the causes? Common causes of this condition include: Venous hypertension. This is high blood pressure inside the veins. Sitting or standing too long. This can cause increased blood pressure in the veins of the leg. Deep vein thrombosis (DVT). This is a blood clot that blocks blood flow in a vein. Phlebitis. This is inflammation of a vein. It can cause a blood clot to form. An abnormal growth of cells (tumor) in the area between your hip bones (pelvis). This can cause blood to back up. What increases the risk? Factors that may make you more likely to get this condition include: Having a family history of the condition. Being overweight. Being pregnant. Not getting enough exercise. Smoking. Having a job that requires you to sit or stand in one place for a long time. Being a certain age. Females in their 40s and 50s and males in their 70s are more likely to get this condition. What are the signs or symptoms? Symptoms of this condition include: Varicose veins. These are veins that are enlarged, bulging, or twisted. Skin breakdown or ulcers. Reddened skin or dark discoloration of the skin on the leg between the knee and ankle. Lipodermatosclerosis. This is brown, smooth, tight, and painful skin just above the ankle. It is often on the inside of the leg. Swelling of the legs. How is this diagnosed? This condition may be diagnosed based on your medical history and a physical exam. You may also need tests, such as: A duplex ultrasound. This shows how blood flows through a blood  vessel. Plethysmography. This tests blood flow. Venogram. This looks at the veins using an X-ray and dye. How is this treated? The goals of treatment are to help you return to an active life and to relieve pain. Treatment may include: Wearing compression stockings. These do not cure the condition but can help relieve symptoms. They can also help stop your condition from getting worse. Sclerotherapy. This involves injecting a solution to shrink damaged veins. Surgery. This may include: Vein stripping. This is when a diseased vein is taken out. Laser ablation surgery. This is when blood flow is cut off through the vein. Repairing or remaking a valve inside the affected vein. Follow these instructions at home: Lifestyle Do not use any products that contain nicotine or tobacco. These products include cigarettes, chewing tobacco, and vaping devices, such as e-cigarettes. If you need help quitting, ask your health care provider. Stay active. Exercise, walk, or do other activities. Ask your provider what activities are safe for you. General instructions Take over-the-counter and prescription medicines only as told by your provider. Drink enough fluid to keep your pee (urine) pale yellow. Wear compression stockings as told by your provider. These stockings help to prevent blood clots and reduce swelling in your legs. Keep all follow-up visits. Your provider will check your legs for any changes and adjust your treatment plan as needed. Contact a health care provider if: You have redness, swelling, or more pain in the affected area. You see a red streak or line that goes up or down from the   area. You have skin breakdown or skin loss. You get an injury in the affected area. You get a fever. Get help right away if: You have severe pain that does not get better with medicine. You get an injury and an open wound in the affected area. Your foot or ankle becomes numb or weak all of a sudden. You have  trouble moving your foot or ankle. Your symptoms do not go away or get worse. You have chest pain. You have shortness of breath. These symptoms may be an emergency. Get help right away. Call 911. Do not wait to see if the symptoms will go away. Do not drive yourself to the hospital. This information is not intended to replace advice given to you by your health care provider. Make sure you discuss any questions you have with your health care provider. Document Revised: 09/03/2022 Document Reviewed: 09/03/2022 Elsevier Patient Education  2024 [employer] Inc.  

## 2023-02-17 NOTE — Progress Notes (Signed)
Established Patient Office Visit  Subjective   Patient ID: Tamara Greer, female    DOB: Apr 23, 1966  Age: 57 y.o. MRN: [id number]  Chief Complaint  Patient presents with   Follow-up   Leg Swelling   Diabetes    HPI Pt is a 57 yo obese female with T2DM, chronic constipation, HTN, AAA, OSA, aortic sclerosis who presents to the clinic for 3 month follow up.   She has not been tolerating ozempic well at 1mg  dose. She has had lots of GI upset and nausea and vomiting. She would like to decrease down to .5mg  dosing weekly. No open sores or wounds. She is having more SOB with exertion.   She is having more problems in the last month with bilateral lower ext painful edema. No injury. Not using compression. She is using lasix bid.   She has intermittent itchy rash on chest. Topical steroid does help.   Active Ambulatory Problems    Diagnosis Date Noted   Endometrial cancer (HCC) 05/16/2014   Essential hypertension 05/17/2014   Insomnia 05/17/2014   Anxiety 05/17/2014   Right tibial fracture 05/17/2014   Esophageal reflux 05/17/2014   Vitamin D deficiency 05/19/2014   Elevated liver enzymes 05/19/2014   Primary osteoarthritis of both knees 05/31/2014   BPV (benign positional vertigo) 09/30/2014   Fatty liver disease, nonalcoholic 10/14/2014   Abdominal aortic aneurysm (HCC) 10/21/2014   LUQ pain 12/28/2014   DDD (degenerative disc disease), cervical 08/08/2015   Muscle spasms of head or neck 08/08/2015   Notalgia 08/08/2015   Class 3 severe obesity due to excess calories with serious comorbidity and body mass index (BMI) of 60.0 to 69.9 in adult (HCC) 10/13/2015   Pre-diabetes 10/13/2015   Hyperlipidemia 10/13/2015   MRSA infection 01/28/2017   Angina pectoris (HCC)    No energy 10/23/2017   Controlled type 2 diabetes mellitus with hyperglycemia, without long-term current use of insulin (HCC) 11/21/2017   Medication side effect 11/21/2017   Hypotension 01/19/2018   OSA  (obstructive sleep apnea) 01/12/2019   Vitamin D insufficiency 04/21/2019   Thyroid disorder screening 04/21/2019   Constipation, chronic 01/15/2021   Bilateral edema of lower extremity 05/28/2021   Sore throat 10/01/2021   Cephalosporin drug rash 05/15/2022   Diabetes mellitus type 2, controlled, without complications (HCC) 09/12/2018   Stiffness of right knee 09/12/2018   Primary osteoarthritis of left knee 09/12/2018   Idiopathic sleep related nonobstructive alveolar hypoventilation 09/12/2018   Closed fracture of left tibial plateau with routine healing 09/12/2018   Venous insufficiency (chronic) (peripheral) 02/17/2023   Bilateral lower extremity edema 02/17/2023   Aortic valve sclerosis 02/17/2023   Rash and nonspecific skin eruption 02/17/2023   Resolved Ambulatory Problems    Diagnosis Date Noted   Abdominal pain, left upper quadrant 10/28/2014   Diabetes mellitus type II, controlled (HCC) 12/28/2014   Abdominal pain, right upper quadrant 07/25/2015   Diarrhea 07/25/2015   Bloating 07/25/2015   Heaviness of upper extremity 11/28/2016   Abscess of breast, right 01/16/2017   Cellulitis of right breast 01/28/2017   Abscess of right breast 01/28/2017   Past Medical History:  Diagnosis Date   Anxiety state 05/17/2014   Cancer (HCC)    Essential hypertension, benign 05/17/2014   Morbid obesity (HCC) 10/13/2015     ROS See HPI.    Objective:     BP (!) 143/72 (BP Location: Left Arm, Patient Position: Sitting, Cuff Size: Large)   Pulse 63   Ht 5' (1.524  m)   Wt (!) 309 lb (140.2 kg)   SpO2 97%   BMI 60.35 kg/m  BP Readings from Last 3 Encounters:  02/17/23 (!) 143/72  01/15/23 121/72  10/30/22 (!) 138/90   Wt Readings from Last 3 Encounters:  02/17/23 (!) 309 lb (140.2 kg)  01/15/23 (!) 309 lb (140.2 kg)  10/30/22 (!) 310 lb (140.6 kg)    .[name] Kitchen Results for orders placed or performed in visit on 02/17/23  POCT HgB A1C  Result Value Ref Range   Hemoglobin A1C  6.9 (A) 4.0 - 5.6 %   HbA1c POC (<> result, manual entry)     HbA1c, POC (prediabetic range)     HbA1c, POC (controlled diabetic range)       Physical Exam Vitals reviewed.  Constitutional:      Appearance: Normal appearance. She is obese.  HENT:     Head: Normocephalic.  Cardiovascular:     Rate and Rhythm: Normal rate and regular rhythm.     Heart sounds: Murmur heard.  Pulmonary:     Effort: Pulmonary effort is normal.     Breath sounds: Normal breath sounds.  Musculoskeletal:     Right lower leg: Edema present.     Left lower leg: Edema present.  Skin:    Comments: Linear excoriations of chest  Neurological:     General: No focal deficit present.     Mental Status: She is alert and oriented to person, place, and time.  Psychiatric:        Mood and Affect: Mood normal.     The 10-year ASCVD risk score (Arnett DK, et al., 2019) is: 6.3%    Assessment & Plan:  [name] KitchenMarland KitchenSherlita was seen today for follow-up, leg swelling and diabetes.  Diagnoses and all orders for this visit:  Controlled type 2 diabetes mellitus with hyperglycemia, without long-term current use of insulin (HCC) -     POCT HgB A1C -     COMPLETE METABOLIC PANEL WITH GFR  Venous insufficiency (chronic) (peripheral) -     COMPLETE METABOLIC PANEL WITH GFR -     Brain natriuretic peptide  Bilateral lower extremity edema -     COMPLETE METABOLIC PANEL WITH GFR -     Brain natriuretic peptide -     ECHOCARDIOGRAM COMPLETE; Future  Class 3 severe obesity due to excess calories with serious comorbidity and body mass index (BMI) of 60.0 to 69.9 in adult [facility]) -     ECHOCARDIOGRAM COMPLETE; Future  Aortic valve sclerosis -     ECHOCARDIOGRAM COMPLETE; Future  Rash and nonspecific skin eruption  Other orders -     Semaglutide,0.25 or 0.5MG /DOS, (OZEMPIC, 0.25 OR 0.5 MG/DOSE,) 2 MG/3ML SOPN; Inject 0.5 mg into the skin once a week.   A1C to goal today Continue on same medications-decreased ozempic to  .5mg  weekly Not taken BP medications today-BP not to goal Foot and eye exam UTD On statin Vaccines UTD Follow up in 3 months  Lower extremity edema  Cmp/BNP ordered today Recheck echo due to SOB and hx of aortic sclerosis Unna boots apply today Ok to remove after 3 days then use ace wrap for compression Consider increasing or adding more diuretic control once cmp is back Follow up in 2 weeks   Rash appears like excoriations from maybe a heat rash    Return in about 3 months (around 05/20/2023).    Tandy Gaw, PA-C

## 2023-02-19 ENCOUNTER — Ambulatory Visit: Payer: Medicare Other | Admitting: Physician Assistant

## 2023-03-12 ENCOUNTER — Ambulatory Visit (HOSPITAL_BASED_OUTPATIENT_CLINIC_OR_DEPARTMENT_OTHER): Payer: Medicare Other

## 2023-04-01 ENCOUNTER — Other Ambulatory Visit: Payer: Self-pay | Admitting: Physician Assistant

## 2023-04-01 DIAGNOSIS — I10 Essential (primary) hypertension: Secondary | ICD-10-CM

## 2023-04-03 ENCOUNTER — Ambulatory Visit (HOSPITAL_BASED_OUTPATIENT_CLINIC_OR_DEPARTMENT_OTHER): Payer: Medicare Other

## 2023-04-30 ENCOUNTER — Other Ambulatory Visit: Payer: Self-pay | Admitting: Physician Assistant

## 2023-04-30 DIAGNOSIS — F419 Anxiety disorder, unspecified: Secondary | ICD-10-CM

## 2023-05-07 ENCOUNTER — Telehealth: Payer: Self-pay | Admitting: Physician Assistant

## 2023-05-07 NOTE — Telephone Encounter (Signed)
Prescription Request  05/07/2023  LOV: 02/17/2023  What is the name of the medication or equipment? ALPRAZolam (XANAX) 0.5 MG tablet   Have you contacted your pharmacy to request a refill? Yes   Which pharmacy would you like this sent to?   [facility] - [place], [place] - [address] [place] [address] Phone: [phone] Fax: [phone]    Patient notified that their request is being sent to the clinical staff for review and that they should receive a response within 2 business days.   Please advise at [facility] [phone]

## 2023-05-08 NOTE — Telephone Encounter (Signed)
Duplicate message, request sent to pcp

## 2023-05-09 NOTE — Telephone Encounter (Signed)
..  PDMP reviewed during this encounter. No concerns Seen within the last 6 months Sent refills.

## 2023-05-16 ENCOUNTER — Other Ambulatory Visit: Payer: Self-pay | Admitting: Physician Assistant

## 2023-05-16 DIAGNOSIS — K2100 Gastro-esophageal reflux disease with esophagitis, without bleeding: Secondary | ICD-10-CM

## 2023-05-20 ENCOUNTER — Ambulatory Visit: Payer: Medicare Other | Admitting: Physician Assistant

## 2023-05-27 ENCOUNTER — Encounter: Payer: Self-pay | Admitting: Physician Assistant

## 2023-05-27 ENCOUNTER — Ambulatory Visit: Payer: Medicare Other | Admitting: Physician Assistant

## 2023-05-27 VITALS — BP 128/78 | HR 73 | Ht 60.0 in

## 2023-05-27 DIAGNOSIS — Z7985 Long-term (current) use of injectable non-insulin antidiabetic drugs: Secondary | ICD-10-CM

## 2023-05-27 DIAGNOSIS — K760 Fatty (change of) liver, not elsewhere classified: Secondary | ICD-10-CM

## 2023-05-27 DIAGNOSIS — E1165 Type 2 diabetes mellitus with hyperglycemia: Secondary | ICD-10-CM | POA: Diagnosis not present

## 2023-05-27 DIAGNOSIS — F339 Major depressive disorder, recurrent, unspecified: Secondary | ICD-10-CM

## 2023-05-27 DIAGNOSIS — I10 Essential (primary) hypertension: Secondary | ICD-10-CM | POA: Diagnosis not present

## 2023-05-27 DIAGNOSIS — F5101 Primary insomnia: Secondary | ICD-10-CM

## 2023-05-27 DIAGNOSIS — F419 Anxiety disorder, unspecified: Secondary | ICD-10-CM

## 2023-05-27 DIAGNOSIS — G4733 Obstructive sleep apnea (adult) (pediatric): Secondary | ICD-10-CM

## 2023-05-27 LAB — POCT GLYCOSYLATED HEMOGLOBIN (HGB A1C): Hemoglobin A1C: 7.3 % — AB (ref 4.0–5.6)

## 2023-05-27 MED ORDER — ZOLPIDEM TARTRATE 5 MG PO TABS
5.0000 mg | ORAL_TABLET | Freq: Every evening | ORAL | 2 refills | Status: DC | PRN
Start: 2023-05-27 — End: 2024-06-15

## 2023-05-27 MED ORDER — SEMAGLUTIDE (1 MG/DOSE) 4 MG/3ML ~~LOC~~ SOPN
1.0000 mg | PEN_INJECTOR | SUBCUTANEOUS | 0 refills | Status: DC
Start: 2023-05-27 — End: 2023-05-28

## 2023-05-27 MED ORDER — ALPRAZOLAM 0.5 MG PO TABS
0.5000 mg | ORAL_TABLET | Freq: Every day | ORAL | 1 refills | Status: DC
Start: 2023-05-27 — End: 2023-08-05

## 2023-05-27 NOTE — Progress Notes (Unsigned)
Established Patient Office Visit  Subjective   Patient ID: Tamara Greer, female    DOB: Mar 01, 1966  Age: 57 y.o. MRN: [id number]  Chief Complaint  Patient presents with   Medical Management of Chronic Issues    Last A1c 6.9    HPI  Patient is a 57 year old female who is in today with T2DM, HTN, OSA and intermittent rash here for a 3 month follow up.   Patient has been tolerating ozempic at 0.5 mg dose and denies any nausea, vomiting, or abdominal pain. Patient requests to increase dose to 1 mg today. She reports to consuming a general diet and drinking water. No hypoglycemic events. Not checking sugars.   A1c is increased today from 3 months ago. Patient denies any CP, SOB, HA, or vision changes.   She does complain of trouble falling asleep and staying asleep recently. She does have OSA and uses CPAP nightly. States that she would like to try a medication.   She states that her intermittent red rash has subsided since discontinuing use of Aleve. Patient reports that she only notices the rash now after taking Aleve. No longer taking Aleve.    .. Active Ambulatory Problems    Diagnosis Date Noted   Endometrial cancer (HCC) 05/16/2014   Essential hypertension 05/17/2014   Insomnia 05/17/2014   Anxiety 05/17/2014   Right tibial fracture 05/17/2014   Esophageal reflux 05/17/2014   Vitamin D deficiency 05/19/2014   Elevated liver enzymes 05/19/2014   Primary osteoarthritis of both knees 05/31/2014   BPV (benign positional vertigo) 09/30/2014   Fatty liver disease, nonalcoholic 10/14/2014   Abdominal aortic aneurysm (HCC) 10/21/2014   LUQ pain 12/28/2014   DDD (degenerative disc disease), cervical 08/08/2015   Muscle spasms of head or neck 08/08/2015   Notalgia 08/08/2015   Class 3 severe obesity due to excess calories with serious comorbidity and body mass index (BMI) of 60.0 to 69.9 in adult (HCC) 10/13/2015   Pre-diabetes 10/13/2015   Hyperlipidemia 10/13/2015   MRSA  infection 01/28/2017   Angina pectoris (HCC)    No energy 10/23/2017   Controlled type 2 diabetes mellitus with hyperglycemia, without long-term current use of insulin (HCC) 11/21/2017   Medication side effect 11/21/2017   Hypotension 01/19/2018   OSA (obstructive sleep apnea) 01/12/2019   Vitamin D insufficiency 04/21/2019   Thyroid disorder screening 04/21/2019   Constipation, chronic 01/15/2021   Bilateral edema of lower extremity 05/28/2021   Sore throat 10/01/2021   Cephalosporin drug rash 05/15/2022   Diabetes mellitus type 2, controlled, without complications (HCC) 09/12/2018   Stiffness of right knee 09/12/2018   Primary osteoarthritis of left knee 09/12/2018   Idiopathic sleep related nonobstructive alveolar hypoventilation 09/12/2018   Closed fracture of left tibial plateau with routine healing 09/12/2018   Venous insufficiency (chronic) (peripheral) 02/17/2023   Bilateral lower extremity edema 02/17/2023   Aortic valve sclerosis 02/17/2023   Rash and nonspecific skin eruption 02/17/2023   Resolved Ambulatory Problems    Diagnosis Date Noted   Abdominal pain, left upper quadrant 10/28/2014   Diabetes mellitus type II, controlled (HCC) 12/28/2014   Abdominal pain, right upper quadrant 07/25/2015   Diarrhea 07/25/2015   Bloating 07/25/2015   Heaviness of upper extremity 11/28/2016   Abscess of breast, right 01/16/2017   Cellulitis of right breast 01/28/2017   Abscess of right breast 01/28/2017   Past Medical History:  Diagnosis Date   Anxiety state 05/17/2014   Cancer (HCC)    Essential hypertension,  benign 05/17/2014   Morbid obesity (HCC) 10/13/2015    ROS   See HPI.  Objective:     BP 128/78   Pulse 73   Ht 5' (1.524 m)   SpO2 99%   BMI 60.35 kg/m  BP Readings from Last 3 Encounters:  05/27/23 128/78  02/17/23 (!) 143/72  01/15/23 121/72   Wt Readings from Last 3 Encounters:  02/17/23 (!) 309 lb (140.2 kg)  01/15/23 (!) 309 lb (140.2 kg)   10/30/22 (!) 310 lb (140.6 kg)      Physical Exam Constitutional:      Appearance: Normal appearance. She is obese.  HENT:     Head: Normocephalic.  Cardiovascular:     Rate and Rhythm: Normal rate and regular rhythm.     Pulses: Normal pulses.     Heart sounds: Normal heart sounds.  Pulmonary:     Effort: Pulmonary effort is normal.     Breath sounds: Normal breath sounds.  Musculoskeletal:     Right lower leg: Edema present.     Left lower leg: Edema present.  Neurological:     General: No focal deficit present.     Mental Status: She is alert and oriented to person, place, and time.  Psychiatric:        Mood and Affect: Mood normal.      Results for orders placed or performed in visit on 05/27/23  CMP14+EGFR  Result Value Ref Range   Glucose 132 (H) 70 - 99 mg/dL   BUN 12 6 - 24 mg/dL   Creatinine, Ser 1.61 0.57 - 1.00 mg/dL   eGFR 096 >04 VW/UJW/1.19   BUN/Creatinine Ratio 20 9 - 23   Sodium 139 134 - 144 mmol/L   Potassium 4.6 3.5 - 5.2 mmol/L   Chloride 100 96 - 106 mmol/L   CO2 19 (L) 20 - 29 mmol/L   Calcium 9.3 8.7 - 10.2 mg/dL   Total Protein 6.7 6.0 - 8.5 g/dL   Albumin 4.1 3.8 - 4.9 g/dL   Globulin, Total 2.6 1.5 - 4.5 g/dL   Bilirubin Total 0.6 0.0 - 1.2 mg/dL   Alkaline Phosphatase 125 (H) 44 - 121 IU/L   AST 30 0 - 40 IU/L   ALT 31 0 - 32 IU/L  Lipid panel  Result Value Ref Range   Cholesterol, Total 160 100 - 199 mg/dL   Triglycerides 147 0 - 149 mg/dL   HDL 59 >82 mg/dL   VLDL Cholesterol Cal 25 5 - 40 mg/dL   LDL Chol Calc (NIH) 76 0 - 99 mg/dL   Chol/HDL Ratio 2.7 0.0 - 4.4 ratio  TSH  Result Value Ref Range   TSH 2.320 0.450 - 4.500 uIU/mL  POCT HgB A1C  Result Value Ref Range   Hemoglobin A1C 7.3 (A) 4.0 - 5.6 %   HbA1c POC (<> result, manual entry)     HbA1c, POC (prediabetic range)     HbA1c, POC (controlled diabetic range)       The 10-year ASCVD risk score (Arnett DK, et al., 2019) is: 6%    Assessment & Plan:   [name] KitchenMarland KitchenLamecca was seen today for medical management of chronic issues.  Diagnoses and all orders for this visit:  Controlled type 2 diabetes mellitus with hyperglycemia, without long-term current use of insulin (HCC) -     POCT HgB A1C -     CMP14+EGFR -     Lipid panel -     TSH  Essential  hypertension -     CMP14+EGFR -     Lipid panel -     TSH -     metoprolol succinate (TOPROL-XL) 25 MG 24 hr tablet; TAKE 1 TABLET BY MOUTH ONCE DAILY. -     telmisartan (MICARDIS) 40 MG tablet; Take 1 tablet (40 mg total) by mouth daily.  Fatty liver disease, nonalcoholic -     CMP14+EGFR -     Lipid panel -     TSH  Anxiety -     ALPRAZolam (XANAX) 0.5 MG tablet; Take 1 tablet (0.5 mg total) by mouth at bedtime.  Primary insomnia -     zolpidem (AMBIEN) 5 MG tablet; Take 1 tablet (5 mg total) by mouth at bedtime as needed for sleep.  Depression, recurrent (HCC) -     buPROPion (WELLBUTRIN XL) 150 MG 24 hr tablet; Take 1 tablet (150 mg total) by mouth every morning.  Other orders -     Discontinue: Semaglutide, 1 MG/DOSE, 4 MG/3ML SOPN; Inject 1 mg as directed once a week.   A1c not to primary goal of under 7 Increased dose of ozempic to 1 mg and discussed with patient the possible recurrence of previous symptoms Ordered fasting labs today as patient was not able to complete them at her last visit BP to goal today on recheck On statin Declined HM today Needs eye exam, pt reminded Added ambien 5 mg at bedtime to help with sleep, failed trazodone Continue on CPAP PHQ no concerns Continue on lexapro Avoid use of Aleve to prevent recurrence of rash  Follow-up in 3 months    Tandy Gaw, PA-C

## 2023-05-28 ENCOUNTER — Other Ambulatory Visit: Payer: Self-pay

## 2023-05-28 LAB — CMP14+EGFR
ALT: 31 IU/L (ref 0–32)
AST: 30 IU/L (ref 0–40)
Albumin: 4.1 g/dL (ref 3.8–4.9)
Alkaline Phosphatase: 125 IU/L — ABNORMAL HIGH (ref 44–121)
BUN/Creatinine Ratio: 20 (ref 9–23)
BUN: 12 mg/dL (ref 6–24)
Bilirubin Total: 0.6 mg/dL (ref 0.0–1.2)
CO2: 19 mmol/L — ABNORMAL LOW (ref 20–29)
Calcium: 9.3 mg/dL (ref 8.7–10.2)
Chloride: 100 mmol/L (ref 96–106)
Creatinine, Ser: 0.59 mg/dL (ref 0.57–1.00)
Globulin, Total: 2.6 g/dL (ref 1.5–4.5)
Glucose: 132 mg/dL — ABNORMAL HIGH (ref 70–99)
Potassium: 4.6 mmol/L (ref 3.5–5.2)
Sodium: 139 mmol/L (ref 134–144)
Total Protein: 6.7 g/dL (ref 6.0–8.5)
eGFR: 104 mL/min/{1.73_m2} (ref >59)

## 2023-05-28 LAB — LIPID PANEL
Chol/HDL Ratio: 2.7 ratio (ref 0.0–4.4)
Cholesterol, Total: 160 mg/dL (ref 100–199)
HDL: 59 mg/dL (ref >39)
LDL Chol Calc (NIH): 76 mg/dL (ref 0–99)
Triglycerides: 147 mg/dL (ref 0–149)
VLDL Cholesterol Cal: 25 mg/dL (ref 5–40)

## 2023-05-28 LAB — TSH: TSH: 2.32 u[IU]/mL (ref 0.450–4.500)

## 2023-05-28 MED ORDER — SEMAGLUTIDE (1 MG/DOSE) 4 MG/3ML ~~LOC~~ SOPN: 1.0000 mg | PEN_INJECTOR | SUBCUTANEOUS | 0 refills | Status: DC

## 2023-05-28 NOTE — Progress Notes (Signed)
Labs look good no concerns!

## 2023-05-29 ENCOUNTER — Encounter: Payer: Self-pay | Admitting: Physician Assistant

## 2023-05-29 DIAGNOSIS — F339 Major depressive disorder, recurrent, unspecified: Secondary | ICD-10-CM | POA: Insufficient documentation

## 2023-05-29 MED ORDER — TELMISARTAN 40 MG PO TABS
40.0000 mg | ORAL_TABLET | Freq: Every day | ORAL | 1 refills | Status: DC
Start: 2023-05-29 — End: 2023-12-02

## 2023-05-29 MED ORDER — BUPROPION HCL ER (XL) 150 MG PO TB24
150.0000 mg | ORAL_TABLET | ORAL | 1 refills | Status: DC
Start: 2023-05-29 — End: 2024-06-15

## 2023-05-29 MED ORDER — METOPROLOL SUCCINATE ER 25 MG PO TB24: ORAL_TABLET | ORAL | 1 refills | Status: DC

## 2023-06-20 ENCOUNTER — Encounter: Payer: Self-pay | Admitting: Physician Assistant

## 2023-06-20 DIAGNOSIS — R600 Localized edema: Secondary | ICD-10-CM

## 2023-06-20 MED ORDER — FUROSEMIDE 20 MG PO TABS
20.0000 mg | ORAL_TABLET | Freq: Two times a day (BID) | ORAL | 0 refills | Status: DC
Start: 2023-06-20 — End: 2023-08-05

## 2023-08-03 ENCOUNTER — Other Ambulatory Visit: Payer: Self-pay | Admitting: Physician Assistant

## 2023-08-03 DIAGNOSIS — R600 Localized edema: Secondary | ICD-10-CM

## 2023-08-03 DIAGNOSIS — F419 Anxiety disorder, unspecified: Secondary | ICD-10-CM

## 2023-08-18 ENCOUNTER — Other Ambulatory Visit: Payer: Self-pay | Admitting: Physician Assistant

## 2023-08-18 DIAGNOSIS — K2100 Gastro-esophageal reflux disease with esophagitis, without bleeding: Secondary | ICD-10-CM

## 2023-08-22 ENCOUNTER — Encounter: Payer: Self-pay | Admitting: Physician Assistant

## 2023-08-29 ENCOUNTER — Encounter: Payer: Self-pay | Admitting: Physician Assistant

## 2023-08-29 ENCOUNTER — Ambulatory Visit: Payer: Medicare Other | Admitting: Physician Assistant

## 2023-08-29 VITALS — BP 136/72 | HR 80 | Ht 60.0 in

## 2023-08-29 DIAGNOSIS — I872 Venous insufficiency (chronic) (peripheral): Secondary | ICD-10-CM

## 2023-08-29 DIAGNOSIS — I10 Essential (primary) hypertension: Secondary | ICD-10-CM | POA: Diagnosis not present

## 2023-08-29 DIAGNOSIS — E785 Hyperlipidemia, unspecified: Secondary | ICD-10-CM

## 2023-08-29 DIAGNOSIS — E1169 Type 2 diabetes mellitus with other specified complication: Secondary | ICD-10-CM | POA: Insufficient documentation

## 2023-08-29 DIAGNOSIS — E1165 Type 2 diabetes mellitus with hyperglycemia: Secondary | ICD-10-CM | POA: Diagnosis not present

## 2023-08-29 DIAGNOSIS — Z6841 Body Mass Index (BMI) 40.0 and over, adult: Secondary | ICD-10-CM

## 2023-08-29 DIAGNOSIS — M170 Bilateral primary osteoarthritis of knee: Secondary | ICD-10-CM | POA: Insufficient documentation

## 2023-08-29 DIAGNOSIS — Z9181 History of falling: Secondary | ICD-10-CM

## 2023-08-29 DIAGNOSIS — F419 Anxiety disorder, unspecified: Secondary | ICD-10-CM | POA: Diagnosis not present

## 2023-08-29 DIAGNOSIS — E66813 Obesity, class 3: Secondary | ICD-10-CM

## 2023-08-29 LAB — POCT GLYCOSYLATED HEMOGLOBIN (HGB A1C): Hemoglobin A1C: 6.9 % — AB (ref 4.0–5.6)

## 2023-08-29 MED ORDER — ALPRAZOLAM 0.5 MG PO TABS
0.5000 mg | ORAL_TABLET | Freq: Every day | ORAL | 2 refills | Status: DC
Start: 2023-08-29 — End: 2023-12-02

## 2023-08-29 MED ORDER — SEMAGLUTIDE (1 MG/DOSE) 4 MG/3ML ~~LOC~~ SOPN: 1.0000 mg | PEN_INJECTOR | SUBCUTANEOUS | 0 refills | Status: DC

## 2023-08-29 NOTE — Progress Notes (Signed)
Established Patient Office Visit  Subjective   Patient ID: Tamara Greer, female    DOB: 09/28/1965  Age: 57 y.o. MRN: [id number]  Chief Complaint  Patient presents with   Medical Management of Chronic Issues    LAST A1C 7.3    HPI Pt is a 57 yo obese female with T2DM, HTN, bilateral chronic knee pain who presents to the clinic for follow up.   She is not checking her sugars. She is compliant with medications. She denies any hypoglycemic events. She denies any CP, palpitations, headaches, vision changes. Her bilateral knee pain is restricting her movement. She is getting shots but ortho will not do surgery until she has lost 100lbs and she is down 30lbs.     Patient Active Problem List   Diagnosis Date Noted   Bilateral primary osteoarthritis of knee 08/29/2023   Hyperlipidemia associated with type 2 diabetes mellitus (HCC) 08/29/2023   Depression, recurrent (HCC) 05/29/2023   Venous insufficiency (chronic) (peripheral) 02/17/2023   Bilateral lower extremity edema 02/17/2023   Aortic valve sclerosis 02/17/2023   Rash and nonspecific skin eruption 02/17/2023   Cephalosporin drug rash 05/15/2022   Sore throat 10/01/2021   Bilateral edema of lower extremity 05/28/2021   Constipation, chronic 01/15/2021   Vitamin D insufficiency 04/21/2019   Thyroid disorder screening 04/21/2019   OSA (obstructive sleep apnea) 01/12/2019   Diabetes mellitus type 2, controlled, without complications (HCC) 09/12/2018   Stiffness of right knee 09/12/2018   Primary osteoarthritis of left knee 09/12/2018   Idiopathic sleep related nonobstructive alveolar hypoventilation 09/12/2018   Closed fracture of left tibial plateau with routine healing 09/12/2018   Hypotension 01/19/2018   Controlled type 2 diabetes mellitus with hyperglycemia, without long-term current use of insulin (HCC) 11/21/2017   Medication side effect 11/21/2017   No energy 10/23/2017   Angina pectoris (HCC)    MRSA infection  01/28/2017   Class 3 severe obesity due to excess calories with serious comorbidity and body mass index (BMI) of 60.0 to 69.9 in adult (HCC) 10/13/2015   Pre-diabetes 10/13/2015   Hyperlipidemia 10/13/2015   DDD (degenerative disc disease), cervical 08/08/2015   Muscle spasms of head or neck 08/08/2015   Notalgia 08/08/2015   LUQ pain 12/28/2014   Abdominal aortic aneurysm (HCC) 10/21/2014   Fatty liver disease, nonalcoholic 10/14/2014   BPV (benign positional vertigo) 09/30/2014   Primary osteoarthritis of both knees 05/31/2014   Vitamin D deficiency 05/19/2014   Elevated liver enzymes 05/19/2014   Essential hypertension 05/17/2014   Insomnia 05/17/2014   Anxiety 05/17/2014   Right tibial fracture 05/17/2014   Esophageal reflux 05/17/2014   Endometrial cancer (HCC) 05/16/2014   Past Medical History:  Diagnosis Date   Abdominal aortic aneurysm (HCC) 10/21/2014   (CT June 2016 via [facility] contests this Dx) 3.7cm. Asymptomatic. Follow up ultrasound in 2 years.  U/S 07/2015 stable. Managed by surgeon.     Abdominal pain, left upper quadrant 10/28/2014   Abdominal pain, right upper quadrant 07/25/2015   Abscess of breast, right 01/16/2017   Abscess of right breast 01/28/2017   Anxiety state 05/17/2014   Bloating 07/25/2015   BPV (benign positional vertigo) 09/30/2014   Cancer (HCC)    Cellulitis of right breast 01/28/2017   DDD (degenerative disc disease), cervical 08/08/2015   Diabetes mellitus type II, controlled (HCC) 12/28/2014   Diarrhea 07/25/2015   Elevated liver enzymes 05/19/2014   Endometrial cancer (HCC) 05/16/2014   2010 surgery hysterectomy. Remission.     Esophageal  reflux 05/17/2014   7 years endoscopy normal.     Essential hypertension, benign 05/17/2014   Fatty liver disease, nonalcoholic 10/14/2014   Heaviness of upper extremity 11/28/2016   Hyperlipidemia 10/13/2015   Insomnia 05/17/2014   LUQ pain 12/28/2014   Morbid obesity (HCC) 10/13/2015   MRSA infection 01/28/2017    Muscle spasms of head or neck 08/08/2015   Notalgia 08/08/2015   Pre-diabetes 10/13/2015   Primary osteoarthritis of both knees 05/31/2014   Right tibial fracture 05/17/2014   Repaired in 2014. Continues to walk with cane.     Vitamin D deficiency 05/19/2014   Past Surgical History:  Procedure Laterality Date   ABDOMINAL HYSTERECTOMY     LEFT HEART CATH AND CORONARY ANGIOGRAPHY N/A 05/19/2017   Procedure: LEFT HEART CATH AND CORONARY ANGIOGRAPHY;  Surgeon: Corky Crafts, MD;  Location: [facility] INVASIVE CV LAB;  Service: Cardiovascular;  Laterality: N/A;   ORTHOPEDIC SURGERY     steel rod placed in tibia   Family History  Problem Relation Age of Onset   Diabetes Mother    Stroke Mother    Heart failure Mother    Heart failure Father    COPD Father    Allergies  Allergen Reactions   Aleve [Naproxen]     rash   Dexlansoprazole Other (See Comments)    Whole body aches/pain.  Whole body aches/pain.    Lexapro [Escitalopram]     zombie   Metformin     GI side effects   Metformin And Related Nausea And Vomiting   Xigduo Xr [Dapagliflozin Pro-Metformin Er]     Nausea/vomiting.    Diclofenac Rash    Topical cream gave RASH.    Keflex [Cephalexin] Rash      ROS   See HPI.  Objective:     BP 136/72   Pulse 80   Ht 5' (1.524 m)   SpO2 99%   BMI 60.35 kg/m  BP Readings from Last 3 Encounters:  08/29/23 136/72  05/27/23 128/78  02/17/23 (!) 143/72   Wt Readings from Last 3 Encounters:  02/17/23 (!) 309 lb (140.2 kg)  01/15/23 (!) 309 lb (140.2 kg)  10/30/22 (!) 310 lb (140.6 kg)      Physical Exam Constitutional:      Appearance: Normal appearance. She is obese.  HENT:     Head: Normocephalic.  Cardiovascular:     Rate and Rhythm: Normal rate and regular rhythm.     Heart sounds: Murmur heard.  Neurological:     General: No focal deficit present.     Mental Status: She is alert and oriented to person, place, and time.  Psychiatric:        Mood and Affect:  Mood normal.      Results for orders placed or performed in visit on 08/29/23  POCT HgB A1C  Result Value Ref Range   Hemoglobin A1C 6.9 (A) 4.0 - 5.6 %   HbA1c POC (<> result, manual entry)     HbA1c, POC (prediabetic range)     HbA1c, POC (controlled diabetic range)      Last metabolic panel Lab Results  Component Value Date   GLUCOSE 132 (H) 05/27/2023   NA 139 05/27/2023   K 4.6 05/27/2023   CL 100 05/27/2023   CO2 19 (L) 05/27/2023   BUN 12 05/27/2023   CREATININE 0.59 05/27/2023   EGFR 104 05/27/2023   CALCIUM 9.3 05/27/2023   PROT 6.7 05/27/2023   ALBUMIN 4.1 05/27/2023  LABGLOB 2.6 05/27/2023   BILITOT 0.6 05/27/2023   ALKPHOS 125 (H) 05/27/2023   AST 30 05/27/2023   ALT 31 05/27/2023      The 10-year ASCVD risk score (Arnett DK, et al., 2019) is: 6.8%    Assessment & Plan:  [name] KitchenMarland KitchenKameika was seen today for medical management of chronic issues.  Diagnoses and all orders for this visit:  Controlled type 2 diabetes mellitus with hyperglycemia, without long-term current use of insulin (HCC) -     POCT HgB A1C -     Semaglutide, 1 MG/DOSE, 4 MG/3ML SOPN; Inject 1 mg as directed once a week.  Anxiety -     ALPRAZolam (XANAX) 0.5 MG tablet; Take 1 tablet (0.5 mg total) by mouth at bedtime.  Essential hypertension  Class 3 severe obesity due to excess calories with serious comorbidity and body mass index (BMI) of 60.0 to 69.9 in adult [facility]) -     Ambulatory referral to Physical Therapy  Venous insufficiency (chronic) (peripheral) -     Ambulatory referral to Physical Therapy  Hyperlipidemia associated with type 2 diabetes mellitus (HCC)  Bilateral primary osteoarthritis of knee -     Ambulatory referral to Physical Therapy  At high risk for falls -     Ambulatory referral to Physical Therapy   A1C under 7 Continue on same medications Try to keep moving more Goal weight loss of 70 more lbs to be considered for knee replacement Bilateral knee pain  is keeping her from moving like she wants to Referral for aquatic therapy   Return in about 3 months (around 11/27/2023).    Tandy Gaw, PA-C

## 2023-10-06 ENCOUNTER — Ambulatory Visit: Payer: Medicare Other | Admitting: Physician Assistant

## 2023-10-06 VITALS — BP 143/72 | HR 69 | Ht 60.0 in

## 2023-10-06 DIAGNOSIS — Z7985 Long-term (current) use of injectable non-insulin antidiabetic drugs: Secondary | ICD-10-CM

## 2023-10-06 DIAGNOSIS — I872 Venous insufficiency (chronic) (peripheral): Secondary | ICD-10-CM | POA: Diagnosis not present

## 2023-10-06 DIAGNOSIS — E1165 Type 2 diabetes mellitus with hyperglycemia: Secondary | ICD-10-CM | POA: Diagnosis not present

## 2023-10-06 LAB — POCT UA - MICROALBUMIN
Albumin/Creatinine Ratio, Urine, POC: <30
Creatinine, POC: 200 mg/dL
Microalbumin Ur, POC: 10 mg/L

## 2023-10-06 MED ORDER — KETOROLAC TROMETHAMINE 60 MG/2ML IM SOLN
60.0000 mg | Freq: Once | INTRAMUSCULAR | Status: AC
  Administered 2023-10-06: 60 mg via INTRAMUSCULAR

## 2023-10-06 MED ORDER — FUROSEMIDE 10 MG/ML IJ SOLN
10.0000 mg | Freq: Once | INTRAMUSCULAR | Status: AC
  Administered 2023-10-06: 10 mg via INTRAMUSCULAR

## 2023-10-06 NOTE — Progress Notes (Unsigned)
Established Patient Office Visit  Subjective   Patient ID: Tamara Greer, female    DOB: 07-16-1966  Age: 58 y.o. MRN: [id number]  Chief Complaint  Patient presents with   Leg Swelling    Bi-lat edema onset 2-3 weeks     HPI  Pt is a 58 yo obese female with a hx of  with T2DM, HTN, bilateral chronic knee pain who presents to the clinic today with bilateral lower leg pain and swelling for the last two weeks. Pt states that she has had increased itching, skin irritation, swelling and pain in her lower legs. She denies any injury to the area or any changes in her diet that could induce the swelling. She states that her lower legs feel tingly and tender to touch and they become warm to touch at the end of the night after she has been up and walking. She has not been using compression stockings since noticing the increased swelling d/t the skin sensitivity. She continues to take her Lasix daily as well. She states that she has taken Aleve occasionally and this has helped with the pain. Pt denies trying to elevate her feet at night to see if this decreases the swelling.  She denies CP, SOB, palpitations, headaches, dizziness or vision changes.  Review of Systems  Cardiovascular:  Positive for leg swelling.  Skin:  Positive for itching.       Erythema of LE  All other systems reviewed and are negative.   Active Ambulatory Problems    Diagnosis Date Noted   Endometrial cancer (HCC) 05/16/2014   Essential hypertension 05/17/2014   Insomnia 05/17/2014   Anxiety 05/17/2014   Right tibial fracture 05/17/2014   Esophageal reflux 05/17/2014   Vitamin D deficiency 05/19/2014   Elevated liver enzymes 05/19/2014   Primary osteoarthritis of both knees 05/31/2014   BPV (benign positional vertigo) 09/30/2014   Fatty liver disease, nonalcoholic 10/14/2014   Abdominal aortic aneurysm (HCC) 10/21/2014   LUQ pain 12/28/2014   DDD (degenerative disc disease), cervical 08/08/2015   Muscle spasms of  head or neck 08/08/2015   Notalgia 08/08/2015   Class 3 severe obesity due to excess calories with serious comorbidity and body mass index (BMI) of 60.0 to 69.9 in adult (HCC) 10/13/2015   Pre-diabetes 10/13/2015   Hyperlipidemia 10/13/2015   MRSA infection 01/28/2017   Angina pectoris (HCC)    No energy 10/23/2017   Controlled type 2 diabetes mellitus with hyperglycemia, without long-term current use of insulin (HCC) 11/21/2017   Medication side effect 11/21/2017   Hypotension 01/19/2018   OSA (obstructive sleep apnea) 01/12/2019   Vitamin D insufficiency 04/21/2019   Thyroid disorder screening 04/21/2019   Constipation, chronic 01/15/2021   Bilateral edema of lower extremity 05/28/2021   Sore throat 10/01/2021   Cephalosporin drug rash 05/15/2022   Diabetes mellitus type 2, controlled, without complications (HCC) 09/12/2018   Stiffness of right knee 09/12/2018   Primary osteoarthritis of left knee 09/12/2018   Idiopathic sleep related nonobstructive alveolar hypoventilation 09/12/2018   Closed fracture of left tibial plateau with routine healing 09/12/2018   Venous insufficiency (chronic) (peripheral) 02/17/2023   Bilateral lower extremity edema 02/17/2023   Aortic valve sclerosis 02/17/2023   Rash and nonspecific skin eruption 02/17/2023   Depression, recurrent (HCC) 05/29/2023   Bilateral primary osteoarthritis of knee 08/29/2023   Hyperlipidemia associated with type 2 diabetes mellitus (HCC) 08/29/2023   Venous stasis dermatitis 10/06/2023   Resolved Ambulatory Problems    Diagnosis Date Noted  Abdominal pain, left upper quadrant 10/28/2014   Diabetes mellitus type II, controlled (HCC) 12/28/2014   Abdominal pain, right upper quadrant 07/25/2015   Diarrhea 07/25/2015   Bloating 07/25/2015   Heaviness of upper extremity 11/28/2016   Abscess of breast, right 01/16/2017   Cellulitis of right breast 01/28/2017   Abscess of right breast 01/28/2017   Past Medical History:   Diagnosis Date   Anxiety state 05/17/2014   Cancer (HCC)    Essential hypertension, benign 05/17/2014   Morbid obesity (HCC) 10/13/2015      Objective:     BP (!) 143/72   Pulse 69   Ht 5' (1.524 m)   SpO2 99%   BMI 60.35 kg/m   BP Readings from Last 3 Encounters:  10/06/23 (!) 143/72  08/29/23 136/72  05/27/23 128/78   Wt Readings from Last 3 Encounters:  02/17/23 (!) 140.2 kg  01/15/23 (!) 140.2 kg  10/30/22 (!) 140.6 kg      Physical Exam Constitutional:      Appearance: Normal appearance. She is obese.  HENT:     Head: Normocephalic.  Cardiovascular:     Rate and Rhythm: Normal rate and regular rhythm.     Heart sounds: Normal heart sounds.  Pulmonary:     Effort: Pulmonary effort is normal.     Breath sounds: Normal breath sounds.  Musculoskeletal:     Right lower leg: Edema present.  Skin:    General: Skin is dry.     Findings: Erythema (bilateral LE erythema on the anterior aspect) present.     Comments: Slight hyperpigmentation of the anterior aspect of the bilateral LE  Neurological:     General: No focal deficit present.     Mental Status: She is alert and oriented to person, place, and time.  Psychiatric:        Mood and Affect: Mood normal.     Results for orders placed or performed in visit on 10/06/23  POCT UA - Microalbumin  Result Value Ref Range   Microalbumin Ur, POC 10 mg/L   Creatinine, POC 200 mg/dL   Albumin/Creatinine Ratio, Urine, POC <30     {Labs (Optional):23779}  The 10-year ASCVD risk score (Arnett DK, et al., 2019) is: 7.5%    Assessment & Plan:   Tamara Greer was seen today for leg swelling.  Diagnoses and all orders for this visit:  Controlled type 2 diabetes mellitus with hyperglycemia, without long-term current use of insulin (HCC) -     POCT UA - Microalbumin  Venous insufficiency (chronic) (peripheral) -     CMP14+EGFR -     CBC w/Diff/Platelet -     ketorolac (TORADOL) injection 60 mg -     furosemide  (LASIX) injection 10 mg  Venous stasis dermatitis -     CMP14+EGFR -     CBC w/Diff/Platelet -     ketorolac (TORADOL) injection 60 mg -     furosemide (LASIX) injection 10 mg   Bilateral Unna boots Toradol 60 mg injection in office to help with pain and inflammation Lasix 10 mg injection given in office for excess fluid retention POCT Microalbumin normal CBC/CMP14+EGFR ordered   Eye exam not UTD  Return in about 2 days (around 10/08/2023) for work in for leg swelling recheck.    Windy Fast [place], Student-PA

## 2023-10-06 NOTE — Patient Instructions (Signed)
Toradol and Lasix given IM in office.  Wrapped both legs with unna boot.  We will take wrap off Wednesday and see how legs are improving.  Keep legs elevated!

## 2023-10-07 ENCOUNTER — Encounter: Payer: Self-pay | Admitting: Physician Assistant

## 2023-10-08 ENCOUNTER — Ambulatory Visit: Payer: Medicare Other | Admitting: Physician Assistant

## 2023-10-10 ENCOUNTER — Ambulatory Visit: Payer: Medicare Other | Admitting: Physician Assistant

## 2023-11-16 ENCOUNTER — Other Ambulatory Visit: Payer: Self-pay | Admitting: Physician Assistant

## 2023-11-16 DIAGNOSIS — K2100 Gastro-esophageal reflux disease with esophagitis, without bleeding: Secondary | ICD-10-CM

## 2023-11-28 ENCOUNTER — Ambulatory Visit: Payer: Medicare Other | Admitting: Physician Assistant

## 2023-11-29 ENCOUNTER — Other Ambulatory Visit: Payer: Self-pay | Admitting: Physician Assistant

## 2023-11-29 DIAGNOSIS — F419 Anxiety disorder, unspecified: Secondary | ICD-10-CM

## 2023-12-02 ENCOUNTER — Ambulatory Visit: Admitting: Physician Assistant

## 2023-12-02 ENCOUNTER — Encounter: Payer: Self-pay | Admitting: Physician Assistant

## 2023-12-02 VITALS — BP 126/73 | HR 77 | Ht 60.0 in

## 2023-12-02 DIAGNOSIS — E1165 Type 2 diabetes mellitus with hyperglycemia: Secondary | ICD-10-CM | POA: Diagnosis not present

## 2023-12-02 DIAGNOSIS — R600 Localized edema: Secondary | ICD-10-CM

## 2023-12-02 DIAGNOSIS — M5441 Lumbago with sciatica, right side: Secondary | ICD-10-CM

## 2023-12-02 DIAGNOSIS — K5909 Other constipation: Secondary | ICD-10-CM

## 2023-12-02 DIAGNOSIS — I10 Essential (primary) hypertension: Secondary | ICD-10-CM

## 2023-12-02 DIAGNOSIS — F419 Anxiety disorder, unspecified: Secondary | ICD-10-CM | POA: Diagnosis not present

## 2023-12-02 DIAGNOSIS — Z7985 Long-term (current) use of injectable non-insulin antidiabetic drugs: Secondary | ICD-10-CM

## 2023-12-02 DIAGNOSIS — G8929 Other chronic pain: Secondary | ICD-10-CM

## 2023-12-02 DIAGNOSIS — F439 Reaction to severe stress, unspecified: Secondary | ICD-10-CM

## 2023-12-02 LAB — POCT GLYCOSYLATED HEMOGLOBIN (HGB A1C): Hemoglobin A1C: 7.2 % — AB (ref 4.0–5.6)

## 2023-12-02 MED ORDER — TELMISARTAN 40 MG PO TABS: 40.0000 mg | ORAL_TABLET | Freq: Every day | ORAL | 1 refills | Status: DC

## 2023-12-02 MED ORDER — SEMAGLUTIDE (1 MG/DOSE) 4 MG/3ML ~~LOC~~ SOPN
1.0000 mg | PEN_INJECTOR | SUBCUTANEOUS | 0 refills | Status: DC
Start: 2023-12-02 — End: 2024-03-17

## 2023-12-02 MED ORDER — GABAPENTIN 100 MG PO CAPS
ORAL_CAPSULE | ORAL | 2 refills | Status: DC
Start: 2023-12-02 — End: 2024-03-17

## 2023-12-02 MED ORDER — LINACLOTIDE 145 MCG PO CAPS: 145.0000 ug | ORAL_CAPSULE | Freq: Every day | ORAL | 3 refills | Status: AC

## 2023-12-02 MED ORDER — FUROSEMIDE 20 MG PO TABS: 20.0000 mg | ORAL_TABLET | Freq: Two times a day (BID) | ORAL | 0 refills | Status: DC

## 2023-12-02 MED ORDER — METOPROLOL SUCCINATE ER 25 MG PO TB24: ORAL_TABLET | ORAL | 1 refills | Status: DC

## 2023-12-02 MED ORDER — ALPRAZOLAM 0.5 MG PO TABS: 0.5000 mg | ORAL_TABLET | Freq: Every day | ORAL | 2 refills | Status: DC

## 2023-12-02 NOTE — Patient Instructions (Signed)
 Gabapentin 100mg  1-3 tablets as needed at bedtime.

## 2023-12-02 NOTE — Progress Notes (Unsigned)
   Established Patient Office Visit  Subjective   Patient ID: Tamara Greer, female    DOB: 14-May-1966  Age: 58 y.o. MRN: [id number]  No chief complaint on file.   HPI  100s 115s   {History (Optional):23778}  ROS    Objective:     There were no vitals taken for this visit. {Vitals History (Optional):23777}  Physical Exam   No results found for any visits on 12/02/23.  {Labs (Optional):23779}  The 10-year ASCVD risk score (Arnett DK, et al., 2019) is: 7.5%    Assessment & Plan:   Problem List Items Addressed This Visit   None   No follow-ups on file.    Tandy Gaw, PA-C

## 2023-12-03 ENCOUNTER — Encounter: Payer: Self-pay | Admitting: Physician Assistant

## 2023-12-03 DIAGNOSIS — F439 Reaction to severe stress, unspecified: Secondary | ICD-10-CM | POA: Insufficient documentation

## 2024-02-12 ENCOUNTER — Other Ambulatory Visit: Payer: Self-pay | Admitting: Physician Assistant

## 2024-02-12 DIAGNOSIS — K2100 Gastro-esophageal reflux disease with esophagitis, without bleeding: Secondary | ICD-10-CM

## 2024-03-01 ENCOUNTER — Other Ambulatory Visit: Payer: Self-pay | Admitting: Physician Assistant

## 2024-03-01 DIAGNOSIS — F419 Anxiety disorder, unspecified: Secondary | ICD-10-CM

## 2024-03-02 ENCOUNTER — Ambulatory Visit: Payer: Self-pay

## 2024-03-02 NOTE — Telephone Encounter (Signed)
 FYI Only or Action Required?: Action required by provider: clinical question for provider.  Patient was last seen in primary care on 12/02/2023 by Tamara Vermell CROME, PA-C. Called Nurse Triage reporting Leg Swelling/redness and pain 7/10. Symptoms began 2 days ago. Interventions attempted: Nothing. Symptoms are: gradually worsening.  Triage Disposition: to ED Patient/caregiver understands and will follow disposition?: Refused          Copied from CRM [id number]. Topic: Clinical - Red Word Triage >> Mar 02, 2024 10:44 AM Graeme ORN wrote: Red Word that prompted transfer to Nurse Triage: Something wrong with leg, swelling, redness, feels heavy    ----------------------------------------------------------------------- From previous Reason for Contact - Scheduling: Patient/patient representative is calling to schedule an appointment. Refer to attachments for appointment information. Reason for Disposition  [1] Thigh, calf, or ankle swelling AND [2] only 1 side  Answer Assessment - Initial Assessment Questions 1. ONSET: When did the swelling start? (e.g., minutes, hours, days)     2 days ago  2. LOCATION: What part of the leg is swollen?  Are both legs swollen or just one leg?     One leg swollen  3. SEVERITY: How bad is the swelling? (e.g., localized; mild, moderate, severe)   - Localized: Small area of swelling localized to one leg.   - MILD pedal edema: Swelling limited to foot and ankle, pitting edema < 1/4 inch (6 mm) deep, rest and elevation eliminate most or all swelling.   - MODERATE edema: Swelling of lower leg to knee, pitting edema > 1/4 inch (6 mm) deep, rest and elevation only partially reduce swelling.   - SEVERE edema: Swelling extends above knee, facial or hand swelling present.      moderate 4. REDNESS: Does the swelling look red or infected?     Red  5. PAIN: Is the swelling painful to touch? If Yes, ask: How painful is it?   (Scale 1-10; mild, moderate or  severe)     7 6. FEVER: Do you have a fever? If Yes, ask: What is it, how was it measured, and when did it start?      no 7. CAUSE: What do you think is causing the leg swelling?     unsure 8. MEDICAL HISTORY: Do you have a history of blood clots (e.g., DVT), cancer, heart failure, kidney disease, or liver failure?     no 10. OTHER SYMPTOMS: Do you have any other symptoms? (e.g., chest pain, difficulty breathing)       Leg feels heavy  Protocols used: Leg Swelling and Edema-A-AH

## 2024-03-02 NOTE — Telephone Encounter (Signed)
 Patient next schld appt showing 03/16/2024 with Vermell Bologna - Forwarding message to Dr. Alvan covering Vermell Bologna

## 2024-03-02 NOTE — Telephone Encounter (Signed)
 We can work her in for an acute appointment.  See if anybody has any acute today or tomorrow,  preferably today if possible.

## 2024-03-03 NOTE — Telephone Encounter (Signed)
 Attempted call to patient. Left a detailed voice mail message and requested a return call to schedule asap with any provider opening.

## 2024-03-04 NOTE — Telephone Encounter (Signed)
 Attempted a second call to patient. Left a detailed voice mail message and requested a return call to schedule asap with any provider opening.

## 2024-03-11 ENCOUNTER — Other Ambulatory Visit: Payer: Self-pay | Admitting: Physician Assistant

## 2024-03-11 DIAGNOSIS — F419 Anxiety disorder, unspecified: Secondary | ICD-10-CM

## 2024-03-12 NOTE — Telephone Encounter (Signed)
 Pt checking the status of this refill. She is completely out of her medication and needs it for anxiety. Can the request be expedited?

## 2024-03-16 ENCOUNTER — Encounter: Payer: Self-pay | Admitting: Physician Assistant

## 2024-03-16 ENCOUNTER — Ambulatory Visit: Admitting: Physician Assistant

## 2024-03-16 VITALS — BP 125/77 | HR 56 | Ht 60.0 in | Wt 323.0 lb

## 2024-03-16 DIAGNOSIS — R600 Localized edema: Secondary | ICD-10-CM | POA: Diagnosis not present

## 2024-03-16 DIAGNOSIS — Z7985 Long-term (current) use of injectable non-insulin antidiabetic drugs: Secondary | ICD-10-CM

## 2024-03-16 DIAGNOSIS — E782 Mixed hyperlipidemia: Secondary | ICD-10-CM

## 2024-03-16 DIAGNOSIS — I10 Essential (primary) hypertension: Secondary | ICD-10-CM | POA: Diagnosis not present

## 2024-03-16 DIAGNOSIS — E1165 Type 2 diabetes mellitus with hyperglycemia: Secondary | ICD-10-CM | POA: Diagnosis not present

## 2024-03-16 DIAGNOSIS — G894 Chronic pain syndrome: Secondary | ICD-10-CM

## 2024-03-16 DIAGNOSIS — I890 Lymphedema, not elsewhere classified: Secondary | ICD-10-CM | POA: Insufficient documentation

## 2024-03-16 DIAGNOSIS — F419 Anxiety disorder, unspecified: Secondary | ICD-10-CM | POA: Diagnosis not present

## 2024-03-16 DIAGNOSIS — I872 Venous insufficiency (chronic) (peripheral): Secondary | ICD-10-CM | POA: Insufficient documentation

## 2024-03-16 LAB — POCT GLYCOSYLATED HEMOGLOBIN (HGB A1C): Hemoglobin A1C: 6.8 % — AB (ref 4.0–5.6)

## 2024-03-16 MED ORDER — FUROSEMIDE 20 MG PO TABS
20.0000 mg | ORAL_TABLET | Freq: Two times a day (BID) | ORAL | 0 refills | Status: AC
Start: 2024-03-16 — End: ?

## 2024-03-16 MED ORDER — PREGABALIN 75 MG PO CAPS: 75.0000 mg | ORAL_CAPSULE | Freq: Two times a day (BID) | ORAL | 2 refills | Status: DC

## 2024-03-16 NOTE — Progress Notes (Signed)
 Established Patient Office Visit  Subjective   Patient ID: Tamara Greer, female    DOB: 1966/02/12  Age: 58 y.o. MRN: [id number]  Chief Complaint  Patient presents with   Medical Management of Chronic Issues    Last A1c  was 7.0    Patient is here for a 3 month follow-up for her diabetes. Patient has a history of uncontrolled DM type 2, HTN, AAA, endometrial cancer, degenerative disc disease,venous stasis, and stasis dermatitis. She was seen in the ED for bilateral lower leg swelling with pain on 03/12/2024. She states they gave her Torodol and recommended she uses compression socks. She has ordered compression socks and states she is trying to elevate her legs as much as she can. The left leg is the most painful for her and is sensitive to touch. Patient would like to switch from Gabapentin  to Lyrica  for pain she feels like the Gabapentin  does not help her anymore and she has done research on the Lyrica .   Patient endorses checking blood sugars at home regularly and that she stays around the 140 range. She has been eating healthier and is drinking more water, which is making her feel better. She is compliant with medications. She denies any CP, palpitations, headaches or vision changes. Patient would also like her Ozempic  sent through [facility] because it is cheaper for her and she requests going on a lower dose if possible due to side effects.   Review of Systems  Cardiovascular:  Negative for chest pain and palpitations.  Musculoskeletal:        Lower leg pain, worse in the left leg 2/2 to swelling/edema  Neurological:  Negative for dizziness and tingling.  All other systems reviewed and are negative.     Objective:     BP 125/77   Pulse (!) 56   Ht 5' (1.524 m)   Wt (!) 323 lb (146.5 kg)   SpO2 99%   BMI 63.08 kg/m  BP Readings from Last 3 Encounters:  03/16/24 125/77  12/02/23 126/73  10/06/23 (!) 143/72      Physical Exam Vitals reviewed.  Constitutional:       Appearance: Normal appearance. She is obese.  Cardiovascular:     Rate and Rhythm: Normal rate and regular rhythm.     Pulses: Normal pulses.     Heart sounds: Normal heart sounds.  Pulmonary:     Effort: Pulmonary effort is normal.  Musculoskeletal:     Right lower leg: Edema present.     Left lower leg: Edema present.     Comments: Non-pitting edema in bilateral lower legs. Skin changes from chronic venous stasis seen on both legs as well. Unable to palpate pulses of lower extremities due to swelling and pain.   Skin:    General: Skin is dry.  Neurological:     Mental Status: She is alert.      Results for orders placed or performed in visit on 03/16/24  POCT HgB A1C  Result Value Ref Range   Hemoglobin A1C 6.8 (A) 4.0 - 5.6 %   HbA1c POC (<> result, manual entry)     HbA1c, POC (prediabetic range)     HbA1c, POC (controlled diabetic range)        The 10-year ASCVD risk score (Arnett DK, et al., 2019) is: 6.5%    Assessment & Plan:  SABRASABRAAyden was seen today for medical management of chronic issues.  Diagnoses and all orders for this visit:  Controlled type  2 diabetes mellitus with hyperglycemia, without long-term current use of insulin (HCC) -     POCT HgB A1C -     Semaglutide , 1 MG/DOSE, 4 MG/3ML SOPN; Inject 1 mg as directed once a week.  Anxiety  Essential hypertension  Bilateral edema of lower extremity -     furosemide  (LASIX ) 20 MG tablet; Take 1 tablet (20 mg total) by mouth 2 (two) times daily.  Chronic venous insufficiency -     furosemide  (LASIX ) 20 MG tablet; Take 1 tablet (20 mg total) by mouth 2 (two) times daily. -     Ambulatory referral to Physical Therapy  Lymphedema of both lower extremities -     furosemide  (LASIX ) 20 MG tablet; Take 1 tablet (20 mg total) by mouth 2 (two) times daily. -     Ambulatory referral to Physical Therapy  Mixed hyperlipidemia -     atorvastatin  (LIPITOR) 40 MG tablet; Take 1 tablet (40 mg total) by mouth  daily.  Chronic pain syndrome -     pregabalin  (LYRICA ) 75 MG capsule; Take 1 capsule (75 mg total) by mouth 2 (two) times daily.   Chronic venous stasis with lymphedema - Continue Lasix  20mg  twice a day - Educated patient that she may take an additional dose of Lasix  if her legs begin to swell to the point where she feels she needs to go to the hospital again - Educated patient on wearing compression socks and keeping legs elevated, recommended patient applies unscented lotion (Cerave, Cetaphil) to her lower legs due to dry skin/ skin changes  - Stop Gabapentin , Starting Lyrica  75mg  BID instead - Educated patient on tapering off of Gabapentin  by taking 1 dose of Gabapentin  for a few days in addition to the Lyrica  before completely stopping the Gabapentin   - Referral to Lymphedema clinic for additional management/ assistance in decreasing swelling  Diabetes Mellitus- Type 2 controlled - A1C: 6.8% (last checked was 7.2%) - Continue Ozempic  1mg  weekly, sent refills - Encouraged patient to continue to make diet changes and to continue drinking water and staying active  -BP to goal continue on micardis , metoprolol  -on statin, continue atorvastatin  -encouraged patient to get eye exam scheduled -declined vaccines today  Return in about 3 months (around 06/16/2024).    Raheim Beutler, PA-C

## 2024-03-16 NOTE — Patient Instructions (Signed)
 Will make referral for lymphedema clinic.  Continue same diabetic medications Taper off gabapentin  to lyrica 

## 2024-03-17 ENCOUNTER — Encounter: Payer: Self-pay | Admitting: Physician Assistant

## 2024-03-17 MED ORDER — SEMAGLUTIDE (1 MG/DOSE) 4 MG/3ML ~~LOC~~ SOPN
1.0000 mg | PEN_INJECTOR | SUBCUTANEOUS | 1 refills | Status: DC
Start: 2024-03-17 — End: 2024-06-15

## 2024-03-17 MED ORDER — ATORVASTATIN CALCIUM 40 MG PO TABS: 40.0000 mg | ORAL_TABLET | Freq: Every day | ORAL | 3 refills | Status: AC

## 2024-05-14 ENCOUNTER — Other Ambulatory Visit: Payer: Self-pay | Admitting: Physician Assistant

## 2024-05-14 DIAGNOSIS — K2100 Gastro-esophageal reflux disease with esophagitis, without bleeding: Secondary | ICD-10-CM

## 2024-06-15 ENCOUNTER — Ambulatory Visit: Admitting: Physician Assistant

## 2024-06-15 VITALS — BP 138/77 | HR 74 | Ht 60.0 in

## 2024-06-15 DIAGNOSIS — F329 Major depressive disorder, single episode, unspecified: Secondary | ICD-10-CM

## 2024-06-15 DIAGNOSIS — Z7985 Long-term (current) use of injectable non-insulin antidiabetic drugs: Secondary | ICD-10-CM

## 2024-06-15 DIAGNOSIS — E1165 Type 2 diabetes mellitus with hyperglycemia: Secondary | ICD-10-CM | POA: Diagnosis not present

## 2024-06-15 DIAGNOSIS — G894 Chronic pain syndrome: Secondary | ICD-10-CM

## 2024-06-15 DIAGNOSIS — I10 Essential (primary) hypertension: Secondary | ICD-10-CM

## 2024-06-15 DIAGNOSIS — F419 Anxiety disorder, unspecified: Secondary | ICD-10-CM | POA: Diagnosis not present

## 2024-06-15 DIAGNOSIS — F5101 Primary insomnia: Secondary | ICD-10-CM

## 2024-06-15 DIAGNOSIS — R809 Proteinuria, unspecified: Secondary | ICD-10-CM

## 2024-06-15 MED ORDER — DULOXETINE HCL 20 MG PO CPEP: 20.0000 mg | ORAL_CAPSULE | Freq: Every day | ORAL | 2 refills | Status: AC

## 2024-06-15 MED ORDER — METOPROLOL SUCCINATE ER 25 MG PO TB24: ORAL_TABLET | ORAL | 1 refills | Status: AC

## 2024-06-15 MED ORDER — ALPRAZOLAM 0.5 MG PO TABS: 0.5000 mg | ORAL_TABLET | Freq: Two times a day (BID) | ORAL | 2 refills | Status: DC | PRN

## 2024-06-15 MED ORDER — TELMISARTAN 40 MG PO TABS: 40.0000 mg | ORAL_TABLET | Freq: Every day | ORAL | 1 refills | Status: AC

## 2024-06-15 MED ORDER — SEMAGLUTIDE (1 MG/DOSE) 4 MG/3ML ~~LOC~~ SOPN: 1.0000 mg | PEN_INJECTOR | SUBCUTANEOUS | 1 refills | Status: AC

## 2024-06-15 MED ORDER — PREGABALIN 75 MG PO CAPS
75.0000 mg | ORAL_CAPSULE | Freq: Two times a day (BID) | ORAL | 2 refills | Status: AC
Start: 2024-06-15 — End: ?

## 2024-06-15 MED ORDER — ZOLPIDEM TARTRATE 5 MG PO TABS: 5.0000 mg | ORAL_TABLET | Freq: Every evening | ORAL | 2 refills | Status: AC | PRN

## 2024-06-16 ENCOUNTER — Encounter: Payer: Self-pay | Admitting: Physician Assistant

## 2024-06-16 LAB — POCT GLYCOSYLATED HEMOGLOBIN (HGB A1C): Hemoglobin A1C: 7.2 % — AB (ref 4.0–5.6)

## 2024-06-16 LAB — POCT UA - MICROALBUMIN
Creatinine, POC: 300 mg/dL
Microalbumin Ur, POC: <30 mg/L

## 2024-06-16 NOTE — Progress Notes (Signed)
 Established Patient Office Visit  Subjective   Patient ID: Tamara Greer, female    DOB: [birth year]  Age: 58 y.o. MRN: [id number]  Chief Complaint  Patient presents with   Medical Management of Chronic Issues    HPI Discussed the use of AI scribe software for clinical note transcription with the patient, who gave verbal consent to proceed.  History of Present Illness Tamara Greer is a 58 year old female with diabetes who presents for a three-month follow-up and medication refills.  Glycemic control - Type 2 diabetes mellitus with most recent hemoglobin A1c of 7.2%, slightly elevated from previous levels - Recent increase in A1c attributed to increased intake of Snickers bars gifted by a friend - Currently taking Ozempic  2 mg; inquired about reducing dose to 1 mg - denies any hypoglycemic symptoms - pt is not checking her sugars  Hypertension - Elevated blood pressure readings, not regularly monitored at home - Relies on occasional blood pressure checks at [employer] - Elevated readings associated with missed antihypertensive medication doses  Emotional distress and anxiety - Ongoing emotional distress related to family issues, particularly with her daughter, for nearly one year - Describes feeling 'not doing good emotionally' - Utilizes journaling as a coping mechanism - Currently taking Xanax  0.5 mg once daily for anxiety - History of adverse effects (weight gain, mood changes) with previous mood stabilizing medications - Open to trying new medications but expresses caution regarding potential side effects    ROS See HPI.    Objective:     BP 138/77   Pulse 74   Ht 5' (1.524 m)   SpO2 99%   BMI 63.08 kg/m  BP Readings from Last 3 Encounters:  06/15/24 138/77  03/16/24 125/77  12/02/23 126/73   Wt Readings from Last 3 Encounters:  03/16/24 (!) 323 lb (146.5 kg)  02/17/23 (!) 309 lb (140.2 kg)  01/15/23 (!) 309 lb (140.2 kg)    .SABRA   Physical  Exam Constitutional:      Appearance: Normal appearance. She is obese.  HENT:     Head: Normocephalic.  Cardiovascular:     Rate and Rhythm: Normal rate and regular rhythm.  Pulmonary:     Effort: Pulmonary effort is normal.     Breath sounds: Normal breath sounds.  Musculoskeletal:     Cervical back: Normal range of motion and neck supple.     Right lower leg: Edema present.     Left lower leg: Edema present.  Lymphadenopathy:     Cervical: No cervical adenopathy.  Neurological:     General: No focal deficit present.     Mental Status: She is alert and oriented to person, place, and time.  Psychiatric:        Mood and Affect: Mood normal.      . Results for orders placed or performed in visit on 06/15/24  POCT HgB A1C   Collection Time: 06/16/24  4:21 PM  Result Value Ref Range   Hemoglobin A1C 7.2 (A) 4.0 - 5.6 %   HbA1c POC (<> result, manual entry)     HbA1c, POC (prediabetic range)     HbA1c, POC (controlled diabetic range)    POCT UA - Microalbumin   Collection Time: 06/16/24  4:21 PM  Result Value Ref Range   Microalbumin Ur, POC <30 mg/L   Creatinine, POC 300 mg/dL   Albumin/Creatinine Ratio, Urine, POC 30-300      The 10-year ASCVD risk score (Arnett DK, et al., 2019)  is: 7.8%    Assessment & Plan:  SABRASABRALexia was seen today for medical management of chronic issues.  Diagnoses and all orders for this visit:  Uncontrolled type 2 diabetes mellitus with hyperglycemia (HCC) -     POCT HgB A1C -     POCT UA - Microalbumin  Anxiety -     ALPRAZolam  (XANAX ) 0.5 MG tablet; Take 1 tablet (0.5 mg total) by mouth 2 (two) times daily as needed for anxiety.  Chronic pain syndrome -     pregabalin  (LYRICA ) 75 MG capsule; Take 1 capsule (75 mg total) by mouth 2 (two) times daily. -     DULoxetine (CYMBALTA) 20 MG capsule; Take 1 capsule (20 mg total) by mouth daily.  Controlled type 2 diabetes mellitus with hyperglycemia, without long-term current use of  insulin (HCC) -     Semaglutide , 1 MG/DOSE, 4 MG/3ML SOPN; Inject 1 mg as directed once a week.  Essential hypertension -     metoprolol  succinate (TOPROL -XL) 25 MG 24 hr tablet; TAKE 1 TABLET BY MOUTH ONCE DAILY. -     telmisartan  (MICARDIS ) 40 MG tablet; Take 1 tablet (40 mg total) by mouth daily.  Primary insomnia -     zolpidem  (AMBIEN ) 5 MG tablet; Take 1 tablet (5 mg total) by mouth at bedtime as needed for sleep.  Reactive depression -     DULoxetine (CYMBALTA) 20 MG capsule; Take 1 capsule (20 mg total) by mouth daily.  Microalbuminuria   Assessment & Plan Type 2 diabetes mellitus A1c at 7.2, slightly above target. Decided to maintain current Ozempic  dose to prevent further increase. - Continue Ozempic  at 2 mg. - LDL goal is under 70, continue atorvastatin  - discussed DM diet and importance of regularly movement  Microalbuminuria, abnormal - on ARB - work on keeping BP to goal and A1C under 7 - consider SGLT-2 in the future  Hypertension Blood pressure elevated. Emphasized regular home monitoring. - Encourage regular home blood pressure monitoring. - continue on telmisartan /metoprolol   Major depressive disorder Emotional distress due to family issues. Xanax  0.5 mg daily. Previous medications poorly tolerated. Discussed starting duloxetine for mood and pain. She is willing to try despite concerns about side effects. - Start duloxetine (Cymbalta) 20 mg daily. SE discussed - Increase Xanax  dosage to up to twice a day as needed.    Return in about 3 months (around 09/15/2024).    Tamara Heffern, PA-C

## 2024-06-18 DIAGNOSIS — F329 Major depressive disorder, single episode, unspecified: Secondary | ICD-10-CM | POA: Insufficient documentation

## 2024-06-18 DIAGNOSIS — R809 Proteinuria, unspecified: Secondary | ICD-10-CM | POA: Insufficient documentation

## 2024-09-16 ENCOUNTER — Other Ambulatory Visit: Payer: Self-pay | Admitting: Physician Assistant

## 2024-09-16 DIAGNOSIS — F419 Anxiety disorder, unspecified: Secondary | ICD-10-CM

## 2024-09-17 NOTE — Telephone Encounter (Signed)
 Last filled 06/15/2024  Last OV 06/15/2024  Upcoming appointment 09/21/2024

## 2024-09-20 ENCOUNTER — Ambulatory Visit: Admitting: Physician Assistant

## 2024-09-21 ENCOUNTER — Ambulatory Visit: Admitting: Physician Assistant

## 2024-09-27 ENCOUNTER — Ambulatory Visit: Admitting: Physician Assistant

## 2024-10-01 ENCOUNTER — Ambulatory Visit: Admitting: Physician Assistant

## 2024-10-27 ENCOUNTER — Ambulatory Visit: Admitting: Physician Assistant
